# Patient Record
Sex: Female | Born: 1990
Health system: Southern US, Community
[De-identification: ages and names within clinical notes are randomized; demographics above are authoritative.]

## PROBLEM LIST (undated history)

## (undated) ENCOUNTER — Inpatient Hospital Stay (HOSPITAL_COMMUNITY): Payer: Self-pay

## (undated) DIAGNOSIS — I10 Essential (primary) hypertension: Secondary | ICD-10-CM

## (undated) DIAGNOSIS — Z8742 Personal history of other diseases of the female genital tract: Secondary | ICD-10-CM

## (undated) DIAGNOSIS — E119 Type 2 diabetes mellitus without complications: Secondary | ICD-10-CM

## (undated) DIAGNOSIS — N62 Hypertrophy of breast: Secondary | ICD-10-CM

## (undated) HISTORY — PX: WISDOM TOOTH EXTRACTION: SHX21

## (undated) HISTORY — DX: Hypertrophy of breast: N62

---
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
## 2013-06-17 ENCOUNTER — Emergency Department (HOSPITAL_COMMUNITY)
Admission: EM | Admit: 2013-06-17 | Discharge: 2013-06-17 | Disposition: A | Discharge status: Home / Self Care | Payer: 59 | Source: Home / Self Care | Attending: Emergency Medicine | Admitting: Emergency Medicine

## 2013-06-17 ENCOUNTER — Encounter (HOSPITAL_COMMUNITY): Payer: Self-pay | Admitting: Emergency Medicine

## 2013-06-17 DIAGNOSIS — R42 Dizziness and giddiness: Secondary | ICD-10-CM

## 2013-06-17 LAB — POCT I-STAT, CHEM 8
BUN: 15 mg/dL (ref 6–23)
Calcium, Ion: 1.33 mmol/L — ABNORMAL HIGH (ref 1.12–1.23)
Chloride: 104 mEq/L (ref 96–112)
Creatinine, Ser: 0.9 mg/dL (ref 0.50–1.10)
Glucose, Bld: 99 mg/dL (ref 70–99)
TCO2: 25 mmol/L (ref 0–100)

## 2013-06-17 LAB — POCT URINALYSIS DIP (DEVICE)
Leukocytes, UA: NEGATIVE
Nitrite: NEGATIVE
Protein, ur: NEGATIVE mg/dL
Urobilinogen, UA: 0.2 mg/dL (ref 0.0–1.0)

## 2013-06-17 MED ORDER — MECLIZINE HCL 25 MG PO TABS: 25.0000 mg | ORAL_TABLET | Freq: Three times a day (TID) | ORAL | Status: DC | PRN

## 2013-06-17 NOTE — ED Notes (Signed)
C/o feeling dizzy and lightheaded as if she is going to pass out.  Did not pass out, was able to sit down/lie down .  No fall.  Reports a history of diarrhea and vomiting last week.

## 2013-06-17 NOTE — ED Provider Notes (Signed)
CSN: [id number]     Arrival date & time 06/17/13  1753 History   None    Chief Complaint  Patient presents with  . Dizziness   (Consider location/radiation/quality/duration/timing/severity/associated sxs/prior Treatment) Patient is a 22 y.o. female presenting with dizziness. The history is provided by the patient.  Dizziness Quality:  Head spinning, lightheadedness, room spinning and imbalance Severity:  Moderate Onset quality:  Gradual Duration:  10 days Timing:  Intermittent Progression:  Worsening Chronicity:  New Worsened by:  Closing eyes and lying down Ineffective treatments:  None tried Associated symptoms: headaches, nausea, palpitations and vision changes   Associated symptoms: no blood in stool, no chest pain, no diarrhea, no shortness of breath and no syncope    Cheyenne Lozano is a 22 y.o. female who presents to Urgent care with dizziness that started about 10 days ago. The symptoms are worse and today she felt like she was going to pass out. She drank a lot of fluids but has continued to feel dizzy. She denies any medical problems. LPM 06/04/2013, she takes OC's for birth control. History reviewed. No pertinent past medical history. History reviewed. No pertinent past surgical history. No family history on file. History  Substance Use Topics  . Smoking status: Not on file  . Smokeless tobacco: Not on file  . Alcohol Use: No   OB History   Grav Para Term Preterm Abortions TAB SAB Ect Mult Living                 Review of Systems  Constitutional: Negative for fever and chills.  Eyes: Positive for visual disturbance. Negative for pain, redness and itching.  Respiratory: Negative for chest tightness and shortness of breath.   Cardiovascular: Positive for palpitations. Negative for chest pain and syncope.  Gastrointestinal: Positive for nausea. Negative for abdominal pain, diarrhea and blood in stool.  Musculoskeletal: Positive for back pain. Negative for neck  pain and neck stiffness.  Skin: Negative for rash.  Neurological: Positive for dizziness and headaches. Negative for seizures and syncope.  Psychiatric/Behavioral: Negative for confusion. The patient is nervous/anxious.     Allergies  Review of patient's allergies indicates no known allergies.  Home Medications  No current outpatient prescriptions on file. BP 130/83  Pulse 86  Temp(Src) 98.8 F (37.1 C) (Oral)  Resp 18  SpO2 100%  LMP 06/04/2013 Physical Exam  Nursing note and vitals reviewed. Constitutional: She is oriented to person, place, and time. She appears well-developed and well-nourished. No distress.  HENT:  Head: Normocephalic and atraumatic.  Right Ear: Tympanic membrane normal.  Left Ear: Tympanic membrane normal.  Nose: Nose normal.  Mouth/Throat: Uvula is midline, oropharynx is clear and moist and mucous membranes are normal.  Eyes: Conjunctivae and EOM are normal. Pupils are equal, round, and reactive to light.  Neck: Neck supple.  Cardiovascular: Normal rate, regular rhythm and normal heart sounds.   Pulmonary/Chest: Effort normal and breath sounds normal.  Abdominal: Soft. There is no tenderness.  Musculoskeletal: Normal range of motion.  Neurological: She is alert and oriented to person, place, and time. She has normal strength and normal reflexes. No cranial nerve deficit or sensory deficit. She displays a negative Romberg sign. Coordination and gait normal.  Rapid alternating movement without difficulty.  Skin: Skin is warm and dry.  Psychiatric: She has a normal mood and affect. Her behavior is normal. Thought content normal.    ED Course  Procedures  Results for orders placed during the hospital encounter of  06/17/13 (from the past 24 hour(s))  POCT URINALYSIS DIP (DEVICE)     Status: Abnormal   Collection Time    06/17/13  8:57 PM      Result Value Range   Glucose, UA NEGATIVE  NEGATIVE mg/dL   Bilirubin Urine NEGATIVE  NEGATIVE   Ketones, ur  NEGATIVE  NEGATIVE mg/dL   Specific Gravity, Urine >=1.030  1.005 - 1.030   Hgb urine dipstick TRACE (*) NEGATIVE   pH 6.5  5.0 - 8.0   Protein, ur NEGATIVE  NEGATIVE mg/dL   Urobilinogen, UA 0.2  0.0 - 1.0 mg/dL   Nitrite NEGATIVE  NEGATIVE   Leukocytes, UA NEGATIVE  NEGATIVE  POCT PREGNANCY, URINE     Status: None   Collection Time    06/17/13  8:59 PM      Result Value Range   Preg Test, Ur NEGATIVE  NEGATIVE  POCT I-STAT, CHEM 8     Status: Abnormal   Collection Time    06/17/13  9:26 PM      Result Value Range   Sodium 140  135 - 145 mEq/L   Potassium 3.8  3.5 - 5.1 mEq/L   Chloride 104  96 - 112 mEq/L   BUN 15  6 - 23 mg/dL   Creatinine, Ser 1.61  0.50 - 1.10 mg/dL   Glucose, Bld 99  70 - 99 mg/dL   Calcium, Ion 0.96 (*) 1.12 - 1.23 mmol/L   TCO2 25  0 - 100 mmol/L   Hemoglobin 13.9  12.0 - 15.0 g/dL   HCT 04.5  40.9 - 81.1 %     MDM  22 y.o. female with dizziness. Will start Antivert and she will follow up with ENT. Normal neuro exam. Stable for discharge without any immediate complications. I have reviewed this patient's vital signs, nurses notes, appropriate labs and and discussed findings with the patient and her mother. They voice understanding.    Medication List         meclizine 25 MG tablet  Commonly known as:  ANTIVERT  Take 1 tablet (25 mg total) by mouth 3 (three) times daily as needed for dizziness.           [place], [place] 06/17/13 2207

## 2013-06-18 NOTE — ED Provider Notes (Signed)
Medical screening examination/treatment/procedure(s) were performed by non-physician practitioner and as supervising physician I was immediately available for consultation/collaboration.  Randel Hargens, M.D.  Ernisha Sorn C Duaa Stelzner, MD 06/18/13 0035 

## 2015-02-17 ENCOUNTER — Other Ambulatory Visit (HOSPITAL_COMMUNITY): Payer: Self-pay | Admitting: Plastic Surgery

## 2015-02-18 NOTE — Pre-Procedure Instructions (Signed)
    Cheyenne Lozano  02/18/2015      [facility] - [place], [place] - [address] [place] [address] Phone: [phone] Fax: [phone]    Your procedure is scheduled on 02-27-2015  Friday     Report to [facility] Admitting at 5:30A.M.   Call this number if you have problems the morning of surgery:  [phone]   Remember:  Do not eat food or drink liquids after midnight.   Take these medicines the morning of surgery with A SIP OF WATER   Pain medication if needed               NO DIABETES MEDICATIONS THE MORNING OF SURGERY   Do not wear jewelry, make-up or nail polish.  Do not wear lotions, powders, or perfumes.  You may NOT wear deodorant.  Do not shave 48 hours prior to surgery.     Do not bring valuables to the hospital.  [facility] is not responsible for any belongings or valuables.  Contacts, dentures or bridgework may not be worn into surgery.  Leave your suitcase in the car.  After surgery it may be brought to your room.  For patients admitted to the hospital, discharge time will be determined by your treatment team.  Patients discharged the day of surgery will not be allowed to drive home.    Special instructions:  See attached Sheet "Preparing for Surgery" for instructions on CHG shower  Please read over the following fact sheets that you were given. Pain Booklet and Surgical Site Infection Prevention

## 2015-02-19 ENCOUNTER — Encounter (HOSPITAL_COMMUNITY): Payer: Self-pay

## 2015-02-19 ENCOUNTER — Encounter (HOSPITAL_COMMUNITY)
Admission: RE | Admit: 2015-02-19 | Discharge: 2015-02-19 | Disposition: A | Discharge status: Home / Self Care | Payer: 59 | Source: Ambulatory Visit | Attending: Plastic Surgery | Admitting: Plastic Surgery

## 2015-02-19 DIAGNOSIS — N62 Hypertrophy of breast: Secondary | ICD-10-CM | POA: Diagnosis not present

## 2015-02-19 DIAGNOSIS — I498 Other specified cardiac arrhythmias: Secondary | ICD-10-CM | POA: Diagnosis not present

## 2015-02-19 DIAGNOSIS — Z01810 Encounter for preprocedural cardiovascular examination: Secondary | ICD-10-CM | POA: Insufficient documentation

## 2015-02-19 DIAGNOSIS — Z01812 Encounter for preprocedural laboratory examination: Secondary | ICD-10-CM | POA: Diagnosis present

## 2015-02-19 LAB — BASIC METABOLIC PANEL
Anion gap: 4 — ABNORMAL LOW (ref 5–15)
BUN: 10 mg/dL (ref 6–20)
CALCIUM: 9.1 mg/dL (ref 8.9–10.3)
CHLORIDE: 110 mmol/L (ref 101–111)
CO2: 25 mmol/L (ref 22–32)
CREATININE: 0.77 mg/dL (ref 0.44–1.00)
GFR calc non Af Amer: >60 mL/min (ref >60)
GLUCOSE: 83 mg/dL (ref 65–99)
Potassium: 4 mmol/L (ref 3.5–5.1)
Sodium: 139 mmol/L (ref 135–145)

## 2015-02-19 LAB — CBC
HCT: 37.1 % (ref 36.0–46.0)
Hemoglobin: 11.6 g/dL — ABNORMAL LOW (ref 12.0–15.0)
MCH: 26.9 pg (ref 26.0–34.0)
MCHC: 31.3 g/dL (ref 30.0–36.0)
MCV: 85.9 fL (ref 78.0–100.0)
PLATELETS: 297 10*3/uL (ref 150–400)
RBC: 4.32 MIL/uL (ref 3.87–5.11)
RDW: 14.1 % (ref 11.5–15.5)
WBC: 6.1 10*3/uL (ref 4.0–10.5)

## 2015-02-19 LAB — GLUCOSE, CAPILLARY: Glucose-Capillary: 84 mg/dL (ref 65–99)

## 2015-02-19 LAB — HCG, SERUM, QUALITATIVE: PREG SERUM: NEGATIVE

## 2015-02-20 LAB — HEMOGLOBIN A1C
Hgb A1c MFr Bld: 5.8 % — ABNORMAL HIGH (ref 4.8–5.6)
MEAN PLASMA GLUCOSE: 120 mg/dL

## 2015-02-25 NOTE — Anesthesia Preprocedure Evaluation (Addendum)
Anesthesia Evaluation  Patient identified by MRN, date of birth, ID band Patient awake    Reviewed: Allergy & Precautions, NPO status , Patient's Chart, lab work & pertinent test results  Airway Mallampati: II  TM Distance: >3 FB Neck ROM: Full    Dental  (+) Dental Advisory Given, Teeth Intact   Pulmonary neg pulmonary ROS,    breath sounds clear to auscultation       Cardiovascular negative cardio ROS   Rhythm:Regular Rate:Normal     Neuro/Psych negative neurological ROS     GI/Hepatic negative GI ROS, Neg liver ROS,   Endo/Other  diabetes, Type 2, Oral Hypoglycemic Agents  Renal/GU negative Renal ROS     Musculoskeletal   Abdominal   Peds  Hematology  (+) anemia ,   Anesthesia Other Findings   Reproductive/Obstetrics                           Lab Results  Component Value Date   WBC 6.1 02/19/2015   HGB 11.6* 02/19/2015   HCT 37.1 02/19/2015   MCV 85.9 02/19/2015   PLT 297 02/19/2015   Lab Results  Component Value Date   CREATININE 0.77 02/19/2015   BUN 10 02/19/2015   NA 139 02/19/2015   K 4.0 02/19/2015   CL 110 02/19/2015   CO2 25 02/19/2015    Anesthesia Physical Anesthesia Plan  ASA: II  Anesthesia Plan: General   Post-op Pain Management:    Induction: Intravenous  Airway Management Planned: Oral ETT  Additional Equipment:   Intra-op Plan:   Post-operative Plan: Extubation in OR  Informed Consent: I have reviewed the patients History and Physical, chart, labs and discussed the procedure including the risks, benefits and alternatives for the proposed anesthesia with the patient or authorized representative who has indicated his/her understanding and acceptance.   Dental advisory given  Plan Discussed with: CRNA  Anesthesia Plan Comments:       Anesthesia Quick Evaluation

## 2015-02-26 ENCOUNTER — Ambulatory Visit (HOSPITAL_COMMUNITY): Payer: 59

## 2015-02-26 ENCOUNTER — Encounter (HOSPITAL_COMMUNITY): Payer: Self-pay | Admitting: *Deleted

## 2015-02-26 ENCOUNTER — Ambulatory Visit (HOSPITAL_COMMUNITY)
Admission: RE | Admit: 2015-02-26 | Discharge: 2015-02-27 | Disposition: A | Discharge status: Home / Self Care | Payer: 59 | Source: Ambulatory Visit | Attending: Plastic Surgery | Admitting: Plastic Surgery

## 2015-02-26 ENCOUNTER — Encounter (HOSPITAL_COMMUNITY)
Admission: RE | Disposition: A | Discharge status: Home / Self Care | Payer: Self-pay | Source: Ambulatory Visit | Attending: Plastic Surgery

## 2015-02-26 DIAGNOSIS — N62 Hypertrophy of breast: Secondary | ICD-10-CM | POA: Diagnosis not present

## 2015-02-26 DIAGNOSIS — E119 Type 2 diabetes mellitus without complications: Secondary | ICD-10-CM | POA: Insufficient documentation

## 2015-02-26 DIAGNOSIS — M25511 Pain in right shoulder: Secondary | ICD-10-CM | POA: Insufficient documentation

## 2015-02-26 DIAGNOSIS — D649 Anemia, unspecified: Secondary | ICD-10-CM | POA: Diagnosis not present

## 2015-02-26 DIAGNOSIS — M25512 Pain in left shoulder: Secondary | ICD-10-CM | POA: Insufficient documentation

## 2015-02-26 DIAGNOSIS — Z91018 Allergy to other foods: Secondary | ICD-10-CM | POA: Insufficient documentation

## 2015-02-26 DIAGNOSIS — M542 Cervicalgia: Secondary | ICD-10-CM | POA: Diagnosis not present

## 2015-02-26 HISTORY — DX: Hypertrophy of breast: N62

## 2015-02-26 HISTORY — PX: BREAST REDUCTION SURGERY: SHX8

## 2015-02-26 HISTORY — DX: Type 2 diabetes mellitus without complications: E11.9

## 2015-02-26 HISTORY — PX: REDUCTION MAMMAPLASTY: SUR839

## 2015-02-26 LAB — GLUCOSE, CAPILLARY
GLUCOSE-CAPILLARY: 106 mg/dL — AB (ref 65–99)
GLUCOSE-CAPILLARY: 121 mg/dL — AB (ref 65–99)
Glucose-Capillary: 91 mg/dL (ref 65–99)
Glucose-Capillary: 111 mg/dL — ABNORMAL HIGH (ref 65–99)

## 2015-02-26 SURGERY — MAMMOPLASTY, REDUCTION
Anesthesia: General | Site: Breast | Laterality: Bilateral

## 2015-02-26 MED ORDER — DOCUSATE SODIUM 100 MG PO CAPS
100.0000 mg | ORAL_CAPSULE | Freq: Every day | ORAL | Status: DC
  Administered 2015-02-26: 100 mg via ORAL
  Filled 2015-02-26: qty 1

## 2015-02-26 MED ORDER — INSULIN ASPART 100 UNIT/ML ~~LOC~~ SOLN: 0.0000 [IU] | Freq: Three times a day (TID) | SUBCUTANEOUS | Status: DC

## 2015-02-26 MED ORDER — PROPOFOL 10 MG/ML IV BOLUS
INTRAVENOUS | Status: AC
  Filled 2015-02-26: qty 20

## 2015-02-26 MED ORDER — LIDOCAINE HCL (CARDIAC) 20 MG/ML IV SOLN
INTRAVENOUS | Status: DC | PRN
  Administered 2015-02-26: 40 mg via INTRAVENOUS
  Administered 2015-02-26: 60 mg via INTRAVENOUS

## 2015-02-26 MED ORDER — DIPHENHYDRAMINE HCL 50 MG/ML IJ SOLN
INTRAMUSCULAR | Status: AC
  Filled 2015-02-26: qty 1

## 2015-02-26 MED ORDER — KETOROLAC TROMETHAMINE 30 MG/ML IJ SOLN: 30.0000 mg | Freq: Once | INTRAMUSCULAR | Status: DC

## 2015-02-26 MED ORDER — PROMETHAZINE HCL 25 MG/ML IJ SOLN: 6.2500 mg | INTRAMUSCULAR | Status: DC | PRN

## 2015-02-26 MED ORDER — LIDOCAINE HCL (CARDIAC) 20 MG/ML IV SOLN
INTRAVENOUS | Status: AC
  Filled 2015-02-26: qty 5

## 2015-02-26 MED ORDER — DIPHENHYDRAMINE HCL 50 MG/ML IJ SOLN
12.5000 mg | Freq: Four times a day (QID) | INTRAMUSCULAR | Status: DC | PRN
  Administered 2015-02-26: 12.5 mg via INTRAVENOUS

## 2015-02-26 MED ORDER — METFORMIN HCL 500 MG PO TABS
500.0000 mg | ORAL_TABLET | Freq: Two times a day (BID) | ORAL | Status: DC
  Administered 2015-02-26 – 2015-02-27 (×2): 500 mg via ORAL
  Filled 2015-02-26 (×2): qty 1

## 2015-02-26 MED ORDER — OXYCODONE HCL 5 MG/5ML PO SOLN: 5.0000 mg | Freq: Once | ORAL | Status: DC | PRN

## 2015-02-26 MED ORDER — PROPOFOL 10 MG/ML IV BOLUS
INTRAVENOUS | Status: DC | PRN
  Administered 2015-02-26: 50 mg via INTRAVENOUS
  Administered 2015-02-26: 150 mg via INTRAVENOUS

## 2015-02-26 MED ORDER — ONDANSETRON HCL 4 MG/2ML IJ SOLN
INTRAMUSCULAR | Status: AC
  Filled 2015-02-26: qty 2

## 2015-02-26 MED ORDER — EPHEDRINE SULFATE 50 MG/ML IJ SOLN
INTRAMUSCULAR | Status: AC
  Filled 2015-02-26: qty 1

## 2015-02-26 MED ORDER — ROCURONIUM BROMIDE 50 MG/5ML IV SOLN
INTRAVENOUS | Status: AC
  Filled 2015-02-26: qty 1

## 2015-02-26 MED ORDER — OXYCODONE HCL 5 MG PO TABS: 5.0000 mg | ORAL_TABLET | Freq: Once | ORAL | Status: DC | PRN

## 2015-02-26 MED ORDER — MIDAZOLAM HCL 2 MG/2ML IJ SOLN
INTRAMUSCULAR | Status: AC
  Filled 2015-02-26: qty 4

## 2015-02-26 MED ORDER — MIDAZOLAM HCL 5 MG/5ML IJ SOLN
INTRAMUSCULAR | Status: DC | PRN
  Administered 2015-02-26: 2 mg via INTRAVENOUS

## 2015-02-26 MED ORDER — FENTANYL CITRATE (PF) 250 MCG/5ML IJ SOLN
INTRAMUSCULAR | Status: AC
  Filled 2015-02-26: qty 5

## 2015-02-26 MED ORDER — LACTATED RINGERS IV SOLN
INTRAVENOUS | Status: DC
Start: 2015-02-26 — End: 2015-02-27
  Administered 2015-02-26 – 2015-02-27 (×2): via INTRAVENOUS

## 2015-02-26 MED ORDER — ALBUMIN HUMAN 5 % IV SOLN
INTRAVENOUS | Status: DC | PRN
  Administered 2015-02-26: 09:00:00 via INTRAVENOUS

## 2015-02-26 MED ORDER — HYDROMORPHONE HCL 1 MG/ML IJ SOLN
0.2500 mg | INTRAMUSCULAR | Status: DC | PRN
  Administered 2015-02-26: 0.5 mg via INTRAVENOUS

## 2015-02-26 MED ORDER — SUCCINYLCHOLINE CHLORIDE 20 MG/ML IJ SOLN
INTRAMUSCULAR | Status: DC | PRN
  Administered 2015-02-26: 120 mg via INTRAVENOUS

## 2015-02-26 MED ORDER — SUCCINYLCHOLINE CHLORIDE 20 MG/ML IJ SOLN
INTRAMUSCULAR | Status: AC
  Filled 2015-02-26: qty 1

## 2015-02-26 MED ORDER — CEFAZOLIN SODIUM-DEXTROSE 2-3 GM-% IV SOLR
2.0000 g | INTRAVENOUS | Status: AC
  Administered 2015-02-26: 2 g via INTRAVENOUS

## 2015-02-26 MED ORDER — FENTANYL CITRATE (PF) 250 MCG/5ML IJ SOLN
INTRAMUSCULAR | Status: AC
Start: 2015-02-26 — End: 2015-02-26
  Filled 2015-02-26: qty 5

## 2015-02-26 MED ORDER — ONDANSETRON HCL 4 MG/2ML IJ SOLN
INTRAMUSCULAR | Status: DC | PRN
  Administered 2015-02-26: 4 mg via INTRAVENOUS

## 2015-02-26 MED ORDER — HEPARIN SODIUM (PORCINE) 5000 UNIT/ML IJ SOLN
5000.0000 [IU] | Freq: Once | INTRAMUSCULAR | Status: AC
  Administered 2015-02-26: 5000 [IU] via SUBCUTANEOUS

## 2015-02-26 MED ORDER — FENTANYL CITRATE (PF) 100 MCG/2ML IJ SOLN
INTRAMUSCULAR | Status: DC | PRN
  Administered 2015-02-26: 150 ug via INTRAVENOUS
  Administered 2015-02-26 (×5): 50 ug via INTRAVENOUS

## 2015-02-26 MED ORDER — METHOCARBAMOL 500 MG PO TABS
500.0000 mg | ORAL_TABLET | Freq: Four times a day (QID) | ORAL | Status: DC
  Administered 2015-02-26 (×3): 500 mg via ORAL
  Filled 2015-02-26 (×3): qty 1

## 2015-02-26 MED ORDER — CEFAZOLIN SODIUM 1-5 GM-% IV SOLN
1.0000 g | Freq: Four times a day (QID) | INTRAVENOUS | Status: DC
  Administered 2015-02-26 – 2015-02-27 (×4): 1 g via INTRAVENOUS
  Filled 2015-02-26 (×6): qty 50

## 2015-02-26 MED ORDER — INSULIN ASPART 100 UNIT/ML ~~LOC~~ SOLN: 0.0000 [IU] | Freq: Every day | SUBCUTANEOUS | Status: DC

## 2015-02-26 MED ORDER — HEPARIN SODIUM (PORCINE) 5000 UNIT/ML IJ SOLN
INTRAMUSCULAR | Status: AC
  Administered 2015-02-26: 5000 [IU] via SUBCUTANEOUS
  Filled 2015-02-26: qty 1

## 2015-02-26 MED ORDER — GLYCOPYRROLATE 0.2 MG/ML IJ SOLN
INTRAMUSCULAR | Status: AC
  Filled 2015-02-26: qty 1

## 2015-02-26 MED ORDER — LACTATED RINGERS IV SOLN
INTRAVENOUS | Status: DC | PRN
  Administered 2015-02-26 (×2): via INTRAVENOUS

## 2015-02-26 MED ORDER — CEFAZOLIN SODIUM-DEXTROSE 2-3 GM-% IV SOLR
INTRAVENOUS | Status: AC
  Filled 2015-02-26: qty 50

## 2015-02-26 MED ORDER — HYDROMORPHONE HCL 1 MG/ML IJ SOLN
INTRAMUSCULAR | Status: AC
  Administered 2015-02-26: 0.5 mg via INTRAVENOUS
  Filled 2015-02-26: qty 1

## 2015-02-26 MED ORDER — SODIUM CHLORIDE 0.9 % IJ SOLN
INTRAMUSCULAR | Status: AC
  Filled 2015-02-26: qty 10

## 2015-02-26 MED ORDER — HYDROMORPHONE HCL 2 MG PO TABS
2.0000 mg | ORAL_TABLET | ORAL | Status: DC | PRN
  Administered 2015-02-26 – 2015-02-27 (×2): 4 mg via ORAL
  Filled 2015-02-26 (×2): qty 1
  Filled 2015-02-26: qty 2

## 2015-02-26 MED ORDER — HYDROMORPHONE HCL 1 MG/ML IJ SOLN
0.5000 mg | INTRAMUSCULAR | Status: DC | PRN
  Administered 2015-02-26 – 2015-02-27 (×3): 1 mg via INTRAVENOUS
  Filled 2015-02-26 (×3): qty 1

## 2015-02-26 MED ORDER — 0.9 % SODIUM CHLORIDE (POUR BTL) OPTIME
TOPICAL | Status: DC | PRN
  Administered 2015-02-26: 1000 mL
  Administered 2015-02-26: 2000 mL

## 2015-02-26 SURGICAL SUPPLY — 53 items
ADH SKN CLS APL DERMABOND .7 (GAUZE/BANDAGES/DRESSINGS) ×4
ATCH SMKEVC FLXB CAUT HNDSWH (FILTER) ×1 IMPLANT
BINDER BREAST XLRG (GAUZE/BANDAGES/DRESSINGS) ×2 IMPLANT
BLADE 10 SAFETY STRL DISP (BLADE) ×3 IMPLANT
BNDG CMPR MED 10X6 ELC LF (GAUZE/BANDAGES/DRESSINGS) ×1
BNDG ELASTIC 6X10 VLCR STRL LF (GAUZE/BANDAGES/DRESSINGS) ×2 IMPLANT
CANISTER SUCTION 2500CC (MISCELLANEOUS) ×3 IMPLANT
CHLORAPREP W/TINT 26ML (MISCELLANEOUS) ×5 IMPLANT
COVER SURGICAL LIGHT HANDLE (MISCELLANEOUS) ×3 IMPLANT
DERMABOND ADVANCED (GAUZE/BANDAGES/DRESSINGS) ×8
DERMABOND ADVANCED .7 DNX12 (GAUZE/BANDAGES/DRESSINGS) ×1 IMPLANT
DRAPE ORTHO SPLIT 77X108 STRL (DRAPES) ×6
DRAPE PROXIMA HALF (DRAPES) ×6 IMPLANT
DRAPE SURG ORHT 6 SPLT 77X108 (DRAPES) ×2 IMPLANT
DRAPE WARM FLUID 44X44 (DRAPE) ×3 IMPLANT
DRSG PAD ABDOMINAL 8X10 ST (GAUZE/BANDAGES/DRESSINGS) ×10 IMPLANT
ELECT CAUTERY BLADE 6.4 (BLADE) ×3 IMPLANT
ELECT REM PT RETURN 9FT ADLT (ELECTROSURGICAL) ×3
ELECTRODE REM PT RTRN 9FT ADLT (ELECTROSURGICAL) ×1 IMPLANT
EVACUATOR SMOKE ACCUVAC VALLEY (FILTER) ×2
GLOVE BIO SURGEON STRL SZ7.5 (GLOVE) ×3 IMPLANT
GLOVE BIO SURGEON STRL SZ8 (GLOVE) ×4 IMPLANT
GLOVE BIOGEL PI IND STRL 7.0 (GLOVE) IMPLANT
GLOVE BIOGEL PI IND STRL 7.5 (GLOVE) IMPLANT
GLOVE BIOGEL PI IND STRL 8 (GLOVE) ×1 IMPLANT
GLOVE BIOGEL PI IND STRL 8.5 (GLOVE) IMPLANT
GLOVE BIOGEL PI INDICATOR 7.0 (GLOVE) ×2
GLOVE BIOGEL PI INDICATOR 7.5 (GLOVE) ×2
GLOVE BIOGEL PI INDICATOR 8 (GLOVE) ×2
GLOVE BIOGEL PI INDICATOR 8.5 (GLOVE) ×4
GLOVE ECLIPSE 7.5 STRL STRAW (GLOVE) ×2 IMPLANT
GOWN STRL REUS W/ TWL LRG LVL3 (GOWN DISPOSABLE) ×1 IMPLANT
GOWN STRL REUS W/ TWL XL LVL3 (GOWN DISPOSABLE) ×1 IMPLANT
GOWN STRL REUS W/TWL LRG LVL3 (GOWN DISPOSABLE) ×3
GOWN STRL REUS W/TWL XL LVL3 (GOWN DISPOSABLE) ×3
KIT BASIN OR (CUSTOM PROCEDURE TRAY) ×3 IMPLANT
KIT ROOM TURNOVER OR (KITS) ×3 IMPLANT
MARKER SKIN DUAL TIP RULER LAB (MISCELLANEOUS) ×3 IMPLANT
NS IRRIG 1000ML POUR BTL (IV SOLUTION) ×8 IMPLANT
PACK GENERAL/GYN (CUSTOM PROCEDURE TRAY) ×3 IMPLANT
PAD ABD 8X10 STRL (GAUZE/BANDAGES/DRESSINGS) ×2 IMPLANT
PAD ARMBOARD 7.5X6 YLW CONV (MISCELLANEOUS) ×3 IMPLANT
PREFILTER EVAC NS 1 1/3-3/8IN (MISCELLANEOUS) ×3 IMPLANT
SPONGE GAUZE 4X4 12PLY STER LF (GAUZE/BANDAGES/DRESSINGS) ×2 IMPLANT
SPONGE LAP 18X18 X RAY DECT (DISPOSABLE) ×4 IMPLANT
SUT MNCRL AB 3-0 PS2 18 (SUTURE) ×16 IMPLANT
SUT MNCRL AB 4-0 PS2 18 (SUTURE) ×4 IMPLANT
SUT MON AB 2-0 CT1 36 (SUTURE) ×5 IMPLANT
SUT PROLENE 5 0 PS 2 (SUTURE) ×6 IMPLANT
TOWEL OR 17X24 6PK STRL BLUE (TOWEL DISPOSABLE) ×3 IMPLANT
TOWEL OR 17X26 10 PK STRL BLUE (TOWEL DISPOSABLE) ×3 IMPLANT
TUBE CONNECTING 12'X1/4 (SUCTIONS) ×1
TUBE CONNECTING 12X1/4 (SUCTIONS) ×2 IMPLANT

## 2015-02-26 NOTE — Anesthesia Procedure Notes (Addendum)
Procedure Name: Intubation Date/Time: 02/26/2015 7:50 AM Performed by: Fransisca Kaufmann Pre-anesthesia Checklist: Patient identified, Emergency Drugs available, Suction available, Patient being monitored and Timeout performed Patient Re-evaluated:Patient Re-evaluated prior to inductionOxygen Delivery Method: Circle system utilized Preoxygenation: Pre-oxygenation with 100% oxygen Intubation Type: IV induction Ventilation: Mask ventilation without difficulty Laryngoscope Size: Mac and 3 Grade View: Grade I Tube type: Oral Tube size: 7.5 mm Number of attempts: 1 Airway Equipment and Method: Stylet Placement Confirmation: ETT inserted through vocal cords under direct vision,  positive ETCO2 and breath sounds checked- equal and bilateral Secured at: 21 cm Tube secured with: Tape Dental Injury: Teeth and Oropharynx as per pre-operative assessment  Comments: ETT placed By [facility] RN/Care Link/Dl by [facility] CRNA

## 2015-02-26 NOTE — Anesthesia Postprocedure Evaluation (Signed)
  Anesthesia Post-op Note  Patient: Cheyenne Lozano  Procedure(s) Performed: Procedure(s): BILATERAL BREAST REDUCTION   (Bilateral)  Patient Location: PACU  Anesthesia Type:General  Level of Consciousness: awake and alert   Airway and Oxygen Therapy: Patient Spontanous Breathing  Post-op Pain: none  Post-op Assessment: Post-op Vital signs reviewed              Post-op Vital Signs: Reviewed  Last Vitals:  Filed Vitals:   02/26/15 1315  BP: 128/78  Pulse: 101  Temp: 36.9 C  Resp: 18    Complications: No apparent anesthesia complications

## 2015-02-26 NOTE — Brief Op Note (Signed)
02/26/2015  10:44 AM  PATIENT:  Cheyenne Lozano  24 y.o. female  PRE-OPERATIVE DIAGNOSIS:  BILATERAL BREAST HYPERTROPHY  POST-OPERATIVE DIAGNOSIS:  BILATERAL BREAST HYPERTROPHY  PROCEDURE:  Procedure(s): BILATERAL BREAST REDUCTION   (Bilateral)  SURGEON:  Surgeon(s) and Role:    * Etter Sjogren, MD - Primary  PHYSICIAN ASSISTANT:   ASSISTANTS: Myrtie Soman, RNFA   ANESTHESIA: General  EBL:  Total I/O In: 1250 [I.V.:1000; IV Piggyback:250] Out: -   BLOOD ADMINISTERED:none  DRAINS: none   LOCAL MEDICATIONS USED:  NONE  SPECIMEN:  Source of Specimen:  Bilateral breast  DISPOSITION OF SPECIMEN:  PATHOLOGY  COUNTS:  YES  TOURNIQUET:  * No tourniquets in log *  DICTATION: .Other Dictation: Dictation Number [id number]  PLAN OF CARE: Admit for overnight observation  PATIENT DISPOSITION:  PACU - hemodynamically stable.   Delay start of Pharmacological VTE agent (>24hrs) due to surgical blood loss or risk of bleeding: no

## 2015-02-26 NOTE — OR Nursing (Signed)
Breast tissue weights for removal during procedure: Left breast 787g, Right breast 962g.

## 2015-02-26 NOTE — H&P (Signed)
I have re-examined and re-evaluated the patient and there are no changes. See office notes in paper chart for H&P.  Planned Procedure: Bilateral Breast Reduction

## 2015-02-26 NOTE — Transfer of Care (Signed)
Immediate Anesthesia Transfer of Care Note  Patient: Cheyenne Lozano  Procedure(s) Performed: Procedure(s): BILATERAL BREAST REDUCTION   (Bilateral)  Patient Location: PACU  Anesthesia Type:General  Level of Consciousness: awake, alert , oriented and sedated  Airway & Oxygen Therapy: Patient Spontanous Breathing and Patient connected to nasal cannula oxygen  Post-op Assessment: Report given to RN, Post -op Vital signs reviewed and stable and Patient moving all extremities  Post vital signs: Reviewed and stable  Last Vitals:  Filed Vitals:   02/26/15 0549  BP: 128/80  Pulse: 80  Temp: 36.8 C  Resp: 18    Complications: No apparent anesthesia complications

## 2015-02-27 ENCOUNTER — Encounter (HOSPITAL_COMMUNITY): Payer: Self-pay | Admitting: Plastic Surgery

## 2015-02-27 DIAGNOSIS — N62 Hypertrophy of breast: Secondary | ICD-10-CM | POA: Diagnosis not present

## 2015-02-27 LAB — GLUCOSE, CAPILLARY: GLUCOSE-CAPILLARY: 89 mg/dL (ref 65–99)

## 2015-02-27 MED ORDER — METHOCARBAMOL 500 MG PO TABS: 500.0000 mg | ORAL_TABLET | Freq: Four times a day (QID) | ORAL | Status: DC

## 2015-02-27 MED ORDER — HYDROMORPHONE HCL 2 MG PO TABS: 2.0000 mg | ORAL_TABLET | ORAL | Status: DC | PRN

## 2015-02-27 MED ORDER — DOCUSATE SODIUM 100 MG PO CAPS: 100.0000 mg | ORAL_CAPSULE | Freq: Every day | ORAL | Status: DC

## 2015-02-27 NOTE — Op Note (Signed)
Cheyenne Lozano, Cheyenne Lozano              ACCOUNT NO.:  [id number]  MEDICAL RECORD NO.:  [id number]  LOCATION:  [id number]                        FACILITY:  [facility]  PHYSICIAN:  Etter Sjogren, M.D.     DATE OF BIRTH:  11-May-1991  DATE OF PROCEDURE:  02/26/2015 DATE OF DISCHARGE:                              OPERATIVE REPORT   PREOPERATIVE DIAGNOSIS:  Bilateral macromastia with upper back pain, neck pain, shoulder pain.  POSTOPERATIVE DIAGNOSIS:  Bilateral macromastia with upper back pain, neck pain, shoulder pain.  PROCEDURE:  Bilateral breast reduction.  SURGEON:  Etter Sjogren, M.D.  ASSISTANT:  Myrtie Soman, RNFA.  ANESTHESIA:  General.  ESTIMATED BLOOD LOSS:  80 mL.  CLINICAL NOTE:  A 23 year old woman complains of large breast with associated upper back pain, neck pain, shoulder pain, bra strap shoulder grooving, and difficulty sleeping at night.  She desired bilateral breast reduction.  The nature of this procedure, risks plus complications were discussed with her.  These risks include, but not limited to, bleeding, infection, healing problems, scarring, loss of sensation, loss of sensation in the nipples, anesthesia-related complications, asymmetry, failure to relieve symptoms, loss of tissue, loss of skin, loss of fatty tissue, loss of nipples, inability to breast- feed, chronic pain, and overall disappointment.  She understood all these and wished to proceed.  DESCRIPTION OF PROCEDURE:  The patient was marked in the holding area for the bilateral breast reduction.  She did confirm that the right breast was larger than the left and desired a little more tissue to be removed from that site.  She also desired removal of at least half of her breast tissue.  DESCRIPTION OF PROCEDURE:  She was taken to the operating room and placed supine after successful induction of general anesthesia.  She was prepped with ChloraPrep and after waiting full 3 minutes for drying, she was  draped with sterile drapes.  The 45-mm marker marked the nipple- areolar complexes and inferior nipple-areolar pedicles were designed 8 cm wide.  Incisions made, inferior nipple-areolar pedicles were de- epithelialized and then were isolated from surrounding tissues beveling outward both medial and lateral in order to ensure a much broader attachment at the level the chest wall, then was present level of skin. Conclusion of this dissection, the nipple complexes were inspected and found to have bright red bleeding on the periphery consistent with viability.  Resections were performed medial, central, and lateral.  A total of 787 g was resected from the smaller left side and 962 g from the larger right side.  This seemed to give her fairly good symmetry in terms of volume.  Thorough irrigation with saline.  Meticulous hemostasis with electrocautery.  The closures with 2-0 Monocryl and 3-0 Monocryl inverted deep dermal sutures and a 3-0 Monocryl running subcuticular suture.  Measurement was then taken 5.5 cm up from the inframammary crease and the 42-mm marker marked the site for nipple- areolar complex.  This tissue was dissected, irrigation with saline, hemostasis with electrocautery.  Nipple complexes brought through this opening and were again inspected and found to have bright red bleeding on the periphery consistent with viability.  Nipple-areolar insetting with 3-0 Monocryl in inverted deep dermal sutures and  running 4-0 Monocryl subcuticular suture.  Dermabond, dry sterile dressings, circumferential Ace wrap, and the chest binder were then positioned, and she was transferred to the recovery room stable having tolerated the procedure well.  DISPOSITION:  She will be observed overnight.     Etter Sjogren, M.D.     DB/MEDQ  D:  02/26/2015  T:  02/27/2015  Job:  [id number]

## 2015-02-27 NOTE — Discharge Summary (Signed)
Physician Discharge Summary  Patient ID: Cheyenne Lozano MRN: [id number] DOB/AGE: 1990-08-16 23 y.o.  Admit date: 02/26/2015 Discharge date: 02/27/2015  Admission Diagnoses: Macromastia  Discharge Diagnoses: Same Active Problems:   Breast hypertrophy in female   Discharged Condition: good  Hospital Course: On the day of admission the patient was taken to surgery and had bilateral breast reduction. The patient tolerated the procedures well. Postoperatively, the nipple complexes maintained excellent color and capillary refill. The patient was ambulatory and tolerating diet on the first postoperative day. She is ready for discharge.  Treatments: antibiotics: Ancef, anticoagulation: heparin and surgery: bilateral breast reduction  Discharge Exam: Blood pressure 121/64, pulse 108, temperature 98.4 F (36.9 C), temperature source Oral, resp. rate 17, height  (1.499 m), weight 180 lb 3.2 oz (81.738 kg), last menstrual period 02/13/2015, SpO2 97 %.  Operative sites: Nipple complexes have good color and sensation and appear viable. Chest soft bilateral. No evidence of bleeding or infection either side. Good pain control.  Disposition: 01-Home or Self Care     Medication List    STOP taking these medications        traMADol 50 MG tablet  Commonly known as:  ULTRAM      TAKE these medications        docusate sodium 100 MG capsule  Commonly known as:  COLACE  Take 1 capsule (100 mg total) by mouth daily.     HYDROmorphone 2 MG tablet  Commonly known as:  DILAUDID  Take 1-2 tablets (2-4 mg total) by mouth every 4 (four) hours as needed for moderate pain.     metFORMIN 500 MG tablet  Commonly known as:  GLUCOPHAGE  Take 500 mg by mouth 2 (two) times daily with a meal.     methocarbamol 500 MG tablet  Commonly known as:  ROBAXIN  Take 1 tablet (500 mg total) by mouth 4 (four) times daily.         SignedOdis Luster, Macaria Bias M 02/27/2015, 8:45 AM

## 2015-02-27 NOTE — Discharge Instructions (Addendum)
No lifting for 6 weeks No vigorous activity for 6 weeks (including outdoor walks) No driving for 4 weeks OK to walk up stairs slowly Stay propped up Use incentive spirometer at home every hour while awake No shower until Sunday. Then okay to remove all dressings, shower, be gentle with breasts when washing and drying, place pad under breasts in crease to prevent irritation of the incisions (sanitary napkin works well, and place the chest binder on again Take an over-the-counter Probiotic while on antibiotics Take an over-the-counter stool softener (such as Colace) while on pain medication See Dr. Odis Luster next week in office For questions call [phone] or [phone]

## 2016-11-17 ENCOUNTER — Inpatient Hospital Stay (HOSPITAL_COMMUNITY)
Admission: AD | Admit: 2016-11-17 | Discharge: 2016-11-17 | Disposition: A | Discharge status: Home / Self Care | Payer: 59 | Source: Ambulatory Visit | Attending: Obstetrics and Gynecology | Admitting: Obstetrics and Gynecology

## 2016-11-17 ENCOUNTER — Inpatient Hospital Stay (HOSPITAL_COMMUNITY): Payer: 59

## 2016-11-17 ENCOUNTER — Encounter (HOSPITAL_COMMUNITY): Payer: Self-pay | Admitting: *Deleted

## 2016-11-17 DIAGNOSIS — O24912 Unspecified diabetes mellitus in pregnancy, second trimester: Secondary | ICD-10-CM | POA: Diagnosis not present

## 2016-11-17 DIAGNOSIS — Z3A01 Less than 8 weeks gestation of pregnancy: Secondary | ICD-10-CM | POA: Insufficient documentation

## 2016-11-17 DIAGNOSIS — O26891 Other specified pregnancy related conditions, first trimester: Secondary | ICD-10-CM

## 2016-11-17 DIAGNOSIS — B9689 Other specified bacterial agents as the cause of diseases classified elsewhere: Secondary | ICD-10-CM | POA: Insufficient documentation

## 2016-11-17 DIAGNOSIS — N760 Acute vaginitis: Secondary | ICD-10-CM

## 2016-11-17 DIAGNOSIS — R109 Unspecified abdominal pain: Secondary | ICD-10-CM

## 2016-11-17 DIAGNOSIS — O9989 Other specified diseases and conditions complicating pregnancy, childbirth and the puerperium: Secondary | ICD-10-CM | POA: Diagnosis not present

## 2016-11-17 DIAGNOSIS — O26899 Other specified pregnancy related conditions, unspecified trimester: Secondary | ICD-10-CM

## 2016-11-17 DIAGNOSIS — Z3491 Encounter for supervision of normal pregnancy, unspecified, first trimester: Secondary | ICD-10-CM

## 2016-11-17 DIAGNOSIS — O26892 Other specified pregnancy related conditions, second trimester: Secondary | ICD-10-CM | POA: Insufficient documentation

## 2016-11-17 LAB — URINALYSIS, ROUTINE W REFLEX MICROSCOPIC
Bilirubin Urine: NEGATIVE
GLUCOSE, UA: NEGATIVE mg/dL
Hgb urine dipstick: NEGATIVE
KETONES UR: 5 mg/dL — AB
Leukocytes, UA: NEGATIVE
Nitrite: NEGATIVE
PROTEIN: 30 mg/dL — AB
Specific Gravity, Urine: 1.024 (ref 1.005–1.030)
pH: 6 (ref 5.0–8.0)

## 2016-11-17 LAB — WET PREP, GENITAL
Sperm: NONE SEEN
TRICH WET PREP: NONE SEEN
WBC, Wet Prep HPF POC: NONE SEEN
YEAST WET PREP: NONE SEEN

## 2016-11-17 LAB — HCG, QUANTITATIVE, PREGNANCY: HCG, BETA CHAIN, QUANT, S: 201843 m[IU]/mL — AB (ref <5)

## 2016-11-17 LAB — POCT PREGNANCY, URINE: PREG TEST UR: POSITIVE — AB

## 2016-11-17 LAB — OB RESULTS CONSOLE GC/CHLAMYDIA
CHLAMYDIA, DNA PROBE: NEGATIVE
Gonorrhea: NEGATIVE

## 2016-11-17 MED ORDER — METRONIDAZOLE 500 MG PO TABS: 500.0000 mg | ORAL_TABLET | Freq: Two times a day (BID) | ORAL | 0 refills | Status: AC

## 2016-11-17 NOTE — MAU Note (Signed)
Found out preg about 2 1/2 wks ago. Has not started care or been seen yet.  Has had some cramping.

## 2016-11-17 NOTE — Discharge Instructions (Signed)
Abdominal Pain During Pregnancy °Abdominal pain is common in pregnancy. Most of the time, it does not cause harm. There are many causes of abdominal pain. Some causes are more serious than others and sometimes the cause is not known. Abdominal pain can be a sign that something is very wrong with the pregnancy or the pain may have nothing to do with the pregnancy. Always tell your health care provider if you have any abdominal pain. °Follow these instructions at home: °· Do not have sex or put anything in your vagina until your symptoms go away completely. °· Watch your abdominal pain for any changes. °· Get plenty of rest until your pain improves. °· Drink enough fluid to keep your urine clear or pale yellow. °· Take over-the-counter or prescription medicines only as told by your health care provider. °· Keep all follow-up visits as told by your health care provider. This is important. °Contact a health care provider if: °· You have a fever. °· Your pain gets worse or you have cramping. °· Your pain continues after resting. °Get help right away if: °· You are bleeding, leaking fluid, or passing tissue from the vagina. °· You have vomiting or diarrhea that does not go away. °· You have painful or bloody urination. °· You notice a decrease in your baby's movements. °· You feel very weak or faint. °· You have shortness of breath. °· You develop a severe headache with abdominal pain. °· You have abnormal vaginal discharge with abdominal pain. °This information is not intended to replace advice given to you by your health care provider. Make sure you discuss any questions you have with your health care provider. °Document Released: 06/06/2005 Document Revised: 03/17/2016 Document Reviewed: 01/03/2013 °Elsevier Interactive Patient Education © 2018 [employer] ° °First Trimester of Pregnancy °The first trimester of pregnancy is from week 1 until the end of week 13 (months 1 through 3). A week after a sperm fertilizes an  egg, the egg will implant on the wall of the uterus. This embryo will begin to develop into a baby. Genes from you and your partner will form the baby. The female genes will determine whether the baby will be a boy or a girl. At 6-8 weeks, the eyes and face will be formed, and the heartbeat can be seen on ultrasound. At the end of 12 weeks, all the baby's organs will be formed. °Now that you are pregnant, you will want to do everything you can to have a healthy baby. Two of the most important things are to get good prenatal care and to follow your health care provider's instructions. Prenatal care is all the medical care you receive before the baby's birth. This care will help prevent, find, and treat any problems during the pregnancy and childbirth. °Body changes during your first trimester °Your body goes through many changes during pregnancy. The changes vary from woman to woman. °· You may gain or lose a couple of pounds at first. °· You may feel sick to your stomach (nauseous) and you may throw up (vomit). If the vomiting is uncontrollable, call your health care provider. °· You may tire easily. °· You may develop headaches that can be relieved by medicines. All medicines should be approved by your health care provider. °· You may urinate more often. Painful urination may mean you have a bladder infection. °· You may develop heartburn as a result of your pregnancy. °· You may develop constipation because certain hormones are causing the muscles   that push stool through your intestines to slow down. °· You may develop hemorrhoids or swollen veins (varicose veins). °· Your breasts may begin to grow larger and become tender. Your nipples may stick out more, and the tissue that surrounds them (areola) may become darker. °· Your gums may bleed and may be sensitive to brushing and flossing. °· Dark spots or blotches (chloasma, mask of pregnancy) may develop on your face. This will likely fade after the baby is  born. °· Your menstrual periods will stop. °· You may have a loss of appetite. °· You may develop cravings for certain kinds of food. °· You may have changes in your emotions from day to day, such as being excited to be pregnant or being concerned that something may go wrong with the pregnancy and baby. °· You may have more vivid and strange dreams. °· You may have changes in your hair. These can include thickening of your hair, rapid growth, and changes in texture. Some women also have hair loss during or after pregnancy, or hair that feels dry or thin. Your hair will most likely return to normal after your baby is born. ° °What to expect at prenatal visits °During a routine prenatal visit: °· You will be weighed to make sure you and the baby are growing normally. °· Your blood pressure will be taken. °· Your abdomen will be measured to track your baby's growth. °· The fetal heartbeat will be listened to between weeks 10 and 14 of your pregnancy. °· Test results from any previous visits will be discussed. ° °Your health care provider may ask you: °· How you are feeling. °· If you are feeling the baby move. °· If you have had any abnormal symptoms, such as leaking fluid, bleeding, severe headaches, or abdominal cramping. °· If you are using any tobacco products, including cigarettes, chewing tobacco, and electronic cigarettes. °· If you have any questions. ° °Other tests that may be performed during your first trimester include: °· Blood tests to find your blood type and to check for the presence of any previous infections. The tests will also be used to check for low iron levels (anemia) and protein on red blood cells (Rh antibodies). Depending on your risk factors, or if you previously had diabetes during pregnancy, you may have tests to check for high blood sugar that affects pregnant women (gestational diabetes). °· Urine tests to check for infections, diabetes, or protein in the urine. °· An ultrasound to  confirm the proper growth and development of the baby. °· Fetal screens for spinal cord problems (spina bifida) and Down syndrome. °· HIV (human immunodeficiency virus) testing. Routine prenatal testing includes screening for HIV, unless you choose not to have this test. °· You may need other tests to make sure you and the baby are doing well. ° °Follow these instructions at home: °Medicines °· Follow your health care provider's instructions regarding medicine use. Specific medicines may be either safe or unsafe to take during pregnancy. °· Take a prenatal vitamin that contains at least 600 micrograms (mcg) of folic acid. °· If you develop constipation, try taking a stool softener if your health care provider approves. °Eating and drinking °· Eat a balanced diet that includes fresh fruits and vegetables, whole grains, good sources of protein such as meat, eggs, or tofu, and low-fat dairy. Your health care provider will help you determine the amount of weight gain that is right for you. °· Avoid raw meat and uncooked cheese.   These carry germs that can cause birth defects in the baby. °· Eating four or five small meals rather than three large meals a day may help relieve nausea and vomiting. If you start to feel nauseous, eating a few soda crackers can be helpful. Drinking liquids between meals, instead of during meals, also seems to help ease nausea and vomiting. °· Limit foods that are high in fat and processed sugars, such as fried and sweet foods. °· To prevent constipation: °? Eat foods that are high in fiber, such as fresh fruits and vegetables, whole grains, and beans. °? Drink enough fluid to keep your urine clear or pale yellow. °Activity °· Exercise only as directed by your health care provider. Most women can continue their usual exercise routine during pregnancy. Try to exercise for 30 minutes at least 5 days a week. Exercising will help you: °? Control your weight. °? Stay in shape. °? Be prepared for  labor and delivery. °· Experiencing pain or cramping in the lower abdomen or lower back is a good sign that you should stop exercising. Check with your health care provider before continuing with normal exercises. °· Try to avoid standing for long periods of time. Move your legs often if you must stand in one place for a long time. °· Avoid heavy lifting. °· Wear low-heeled shoes and practice good posture. °· You may continue to have sex unless your health care provider tells you not to. °Relieving pain and discomfort °· Wear a good support bra to relieve breast tenderness. °· Take warm sitz baths to soothe any pain or discomfort caused by hemorrhoids. Use hemorrhoid cream if your health care provider approves. °· Rest with your legs elevated if you have leg cramps or low back pain. °· If you develop varicose veins in your legs, wear support hose. Elevate your feet for 15 minutes, 3-4 times a day. Limit salt in your diet. °Prenatal care °· Schedule your prenatal visits by the twelfth week of pregnancy. They are usually scheduled monthly at first, then more often in the last 2 months before delivery. °· Write down your questions. Take them to your prenatal visits. °· Keep all your prenatal visits as told by your health care provider. This is important. °Safety °· Wear your seat belt at all times when driving. °· Make a list of emergency phone numbers, including numbers for family, friends, the hospital, and police and fire departments. °General instructions °· Ask your health care provider for a referral to a local prenatal education class. Begin classes no later than the beginning of month 6 of your pregnancy. °· Ask for help if you have counseling or nutritional needs during pregnancy. Your health care provider can offer advice or refer you to specialists for help with various needs. °· Do not use hot tubs, steam rooms, or saunas. °· Do not douche or use tampons or scented sanitary pads. °· Do not cross your legs  for long periods of time. °· Avoid cat litter boxes and soil used by cats. These carry germs that can cause birth defects in the baby and possibly loss of the fetus by miscarriage or stillbirth. °· Avoid all smoking, herbs, alcohol, and medicines not prescribed by your health care provider. Chemicals in these products affect the formation and growth of the baby. °· Do not use any products that contain nicotine or tobacco, such as cigarettes and e-cigarettes. If you need help quitting, ask your health care provider. You may receive counseling support and   other resources to help you quit. °· Schedule a dentist appointment. At home, brush your teeth with a soft toothbrush and be gentle when you floss. °Contact a health care provider if: °· You have dizziness. °· You have mild pelvic cramps, pelvic pressure, or nagging pain in the abdominal area. °· You have persistent nausea, vomiting, or diarrhea. °· You have a bad smelling vaginal discharge. °· You have pain when you urinate. °· You notice increased swelling in your face, hands, legs, or ankles. °· You are exposed to fifth disease or chickenpox. °· You are exposed to German measles (rubella) and have never had it. °Get help right away if: °· You have a fever. °· You are leaking fluid from your vagina. °· You have spotting or bleeding from your vagina. °· You have severe abdominal cramping or pain. °· You have rapid weight gain or loss. °· You vomit blood or material that looks like coffee grounds. °· You develop a severe headache. °· You have shortness of breath. °· You have any kind of trauma, such as from a fall or a car accident. °Summary °· The first trimester of pregnancy is from week 1 until the end of week 13 (months 1 through 3). °· Your body goes through many changes during pregnancy. The changes vary from woman to woman. °· You will have routine prenatal visits. During those visits, your health care provider will examine you, discuss any test results you  may have, and talk with you about how you are feeling. °This information is not intended to replace advice given to you by your health care provider. Make sure you discuss any questions you have with your health care provider. °Document Released: 05/31/2001 Document Revised: 05/18/2016 Document Reviewed: 05/18/2016 °Elsevier Interactive Patient Education © 2017 Elsevier Inc. ° °

## 2016-11-17 NOTE — MAU Provider Note (Signed)
History     CSN: [id number]  Arrival date and time: 11/17/16 1621   First Provider Initiated Contact with Patient 11/17/16 1823      Chief Complaint  Patient presents with  . Possible Pregnancy  . Abdominal Cramping   Cheyenne A Mayford Knifeurner is a 26 y.o. G2P0010 at 4032w0d presenting with mild intermittent menstrual-like cramps for about 2 weeks.     Abdominal Cramping  This is a recurrent problem. The current episode started 1 to 4 weeks ago. The onset quality is gradual. The problem occurs intermittently. The problem has been waxing and waning. The pain is located in the suprapubic region. The pain is mild. The quality of the pain is cramping. Pertinent negatives include no constipation, diarrhea, dysuria, frequency, hematuria, nausea or vomiting. Nothing aggravates the pain. The pain is relieved by nothing. She has tried nothing for the symptoms.     Past Medical History:  Diagnosis Date  . Type II diabetes mellitus (HCC)    "actually I'm prediabetic; dr. put me on RX to get A1C #'s down" (02/26/2015)    Past Surgical History:  Procedure Laterality Date  . BREAST REDUCTION SURGERY Bilateral 02/26/2015   Procedure: BILATERAL BREAST REDUCTION  ;  Surgeon: Etter Sjogrenavid Bowers, MD;  Location[facility] OR;  Service: Plastics;  Laterality: Bilateral;  . REDUCTION MAMMAPLASTY Bilateral 02/26/2015  . WISDOM TOOTH EXTRACTION  ~ 2010   "bottom 2"    History reviewed. No pertinent family history.  Social History  Substance Use Topics  . Smoking status: Never Smoker  . Smokeless tobacco: Never Used  . Alcohol use Yes     Comment: 02/26/2015 "2-3 mixed drinks a couple weekends/month"    Allergies:  Allergies  Allergen Reactions  . Coconut Oil Hives and Itching    Prescriptions Prior to Admission  Medication Sig Dispense Refill Last Dose  . Prenatal Vit-Fe Fumarate-FA (PRENATAL MULTIVITAMIN) TABS tablet Take 1 tablet by mouth daily at 12 noon.   11/17/2016 at Unknown time    Review of Systems   Gastrointestinal: Negative for constipation, diarrhea, nausea and vomiting.  Genitourinary: Negative for dysuria, frequency and hematuria.   Physical Exam   Blood pressure 114/68, pulse 65, temperature 98.7 F (37.1 C), temperature source Oral, resp. rate 18, height 5\' 1"  (1.549 m), weight 64.2 kg (141 lb 8 oz), last menstrual period 09/29/2016, SpO2 100 %.  Physical Exam  Nursing note and vitals reviewed. Constitutional: She is oriented to person, place, and time. She appears well-developed and well-nourished. No distress.  HENT:  Head: Normocephalic.  Eyes: No scleral icterus.  Neck: Neck supple. No thyromegaly present.  Cardiovascular: Normal rate.   Respiratory: Effort normal.  GI: Soft. There is no tenderness.  Genitourinary: Vagina normal and uterus normal. No vaginal discharge found.  Genitourinary Comments: Scant white discharge  Musculoskeletal: Normal range of motion.  Neurological: She is alert and oriented to person, place, and time.  Skin: Skin is warm and dry.  Psychiatric: She has a normal mood and affect. Her behavior is normal. Thought content normal.    MAU Course  Procedures Results for orders placed or performed during the hospital encounter of 11/17/16 (from the past 24 hour(s))  Urinalysis, Routine w reflex microscopic     Status: Abnormal   Collection Time: 11/17/16  5:25 PM  Result Value Ref Range   Color, Urine YELLOW YELLOW   APPearance CLEAR CLEAR   Specific Gravity, Urine 1.024 1.005 - 1.030   pH 6.0 5.0 - 8.0  Glucose, UA NEGATIVE NEGATIVE mg/dL   Hgb urine dipstick NEGATIVE NEGATIVE   Bilirubin Urine NEGATIVE NEGATIVE   Ketones, ur 5 (A) NEGATIVE mg/dL   Protein, ur 30 (A) NEGATIVE mg/dL   Nitrite NEGATIVE NEGATIVE   Leukocytes, UA NEGATIVE NEGATIVE   RBC / HPF 0-5 0 - 5 RBC/hpf   WBC, UA 0-5 0 - 5 WBC/hpf   Bacteria, UA RARE (A) NONE SEEN   Squamous Epithelial / LPF 0-5 (A) NONE SEEN   Mucous PRESENT   Pregnancy, urine POC     Status:  Abnormal   Collection Time: 11/17/16  5:35 PM  Result Value Ref Range   Preg Test, Ur POSITIVE (A) NEGATIVE  Wet prep, genital     Status: Abnormal   Collection Time: 11/17/16  6:30 PM  Result Value Ref Range   Yeast Wet Prep HPF POC NONE SEEN NONE SEEN   Trich, Wet Prep NONE SEEN NONE SEEN   Clue Cells Wet Prep HPF POC PRESENT (A) NONE SEEN   WBC, Wet Prep HPF POC NONE SEEN NONE SEEN   Sperm NONE SEEN   hCG, quantitative, pregnancy     Status: Abnormal   Collection Time: 11/17/16  6:34 PM  Result Value Ref Range   hCG, Beta Chain, Quant, S 201,843 (H) <5 mIU/mL  US Ob Comp Less 14 Wks  Result Date: 11/17/2016 CLINICAL DATA:  Abdominal cramping EXAM: OBSTETRIC <14 WK ULTRASOUND TECHNIQUE: Transabdominal ultrasound was performed for evaluation of the gestation as well as the maternal uterus and adnexal regions. COMPARISON:  None. FINDINGS: Intrauterine gestational sac: Single intrauterine gestation Yolk sac:  Visualized Embryo:  Visualized Cardiac Activity: Visualized Heart Rate: 128 bpm CRL:   8.7  mm   6 w 5 d                  [place] EDC: 07/08/2017 Subchorionic hemorrhage:  None visualized. Maternal uterus/adnexae: Bilateral ovaries are within normal limits. The left ovary measures 4.4 x 2.9 x 2.7 cm. The right ovary measures 3.8 x 2.9 x 2.8 cm. No significant free fluid. IMPRESSION: Single viable intrauterine gestation as above. There are no other specific abnormalities visualized. Electronically Signed   By: Jasmine Pang M.D.   On: 11/17/2016 19:33      Assessment and Plan  G2P0010 at [redacted]w[redacted]d 1. Viable pregnancy in first trimester   2. Abdominal pain during pregnancy in first trimester   3. Abdominal pain affecting pregnancy   4. BV (bacterial vaginosis)    Allergies as of 11/17/2016      Reactions   Coconut Oil Hives, Itching      Medication List    TAKE these medications   metroNIDAZOLE 500 MG tablet Commonly known as:  FLAGYL Take 1 tablet (500 mg total) by mouth 2 (two) times  daily.   prenatal multivitamin Tabs tablet Take 1 tablet by mouth daily at 12 noon.     Discussed asymptomatic BV by [facility] and patient elects to wait until 2nd trimester to treat. List of pregnancy providers given and she will choose and call for appointment (private insurance).     Krystian Younglove CNM 11/17/2016, 7:42 PM

## 2016-11-18 LAB — GC/CHLAMYDIA PROBE AMP (~~LOC~~) NOT AT ARMC
Chlamydia: NEGATIVE
Neisseria Gonorrhea: NEGATIVE

## 2016-11-18 LAB — HIV ANTIBODY (ROUTINE TESTING W REFLEX): HIV Screen 4th Generation wRfx: NONREACTIVE

## 2016-12-11 ENCOUNTER — Encounter (HOSPITAL_COMMUNITY): Payer: Self-pay

## 2016-12-11 ENCOUNTER — Inpatient Hospital Stay (HOSPITAL_COMMUNITY)
Admission: AD | Admit: 2016-12-11 | Discharge: 2016-12-11 | Disposition: A | Discharge status: Home / Self Care | Payer: 59 | Source: Ambulatory Visit | Attending: Obstetrics & Gynecology | Admitting: Obstetrics & Gynecology

## 2016-12-11 DIAGNOSIS — O26891 Other specified pregnancy related conditions, first trimester: Secondary | ICD-10-CM | POA: Diagnosis not present

## 2016-12-11 DIAGNOSIS — Z3A10 10 weeks gestation of pregnancy: Secondary | ICD-10-CM | POA: Diagnosis not present

## 2016-12-11 DIAGNOSIS — O24911 Unspecified diabetes mellitus in pregnancy, first trimester: Secondary | ICD-10-CM | POA: Diagnosis not present

## 2016-12-11 DIAGNOSIS — R350 Frequency of micturition: Secondary | ICD-10-CM

## 2016-12-11 DIAGNOSIS — R109 Unspecified abdominal pain: Secondary | ICD-10-CM

## 2016-12-11 DIAGNOSIS — R1084 Generalized abdominal pain: Secondary | ICD-10-CM | POA: Diagnosis not present

## 2016-12-11 DIAGNOSIS — K5909 Other constipation: Secondary | ICD-10-CM

## 2016-12-11 LAB — URINALYSIS, ROUTINE W REFLEX MICROSCOPIC
Bilirubin Urine: NEGATIVE
Glucose, UA: NEGATIVE mg/dL
KETONES UR: NEGATIVE mg/dL
LEUKOCYTES UA: NEGATIVE
Nitrite: NEGATIVE
Protein, ur: NEGATIVE mg/dL
RBC / HPF: NONE SEEN RBC/hpf (ref 0–5)
Specific Gravity, Urine: 1.006 (ref 1.005–1.030)
pH: 7 (ref 5.0–8.0)

## 2016-12-11 MED ORDER — POLYETHYLENE GLYCOL 3350 17 GM/SCOOP PO POWD: 17.0000 g | Freq: Every day | ORAL | 2 refills | Status: DC | PRN

## 2016-12-11 NOTE — Discharge Instructions (Signed)
Abdominal Pain During Pregnancy °Abdominal pain is common in pregnancy. Most of the time, it does not cause harm. There are many causes of abdominal pain. Some causes are more serious than others and sometimes the cause is not known. Abdominal pain can be a sign that something is very wrong with the pregnancy or the pain may have nothing to do with the pregnancy. Always tell your health care provider if you have any abdominal pain. °Follow these instructions at home: °· Do not have sex or put anything in your vagina until your symptoms go away completely. °· Watch your abdominal pain for any changes. °· Get plenty of rest until your pain improves. °· Drink enough fluid to keep your urine clear or pale yellow. °· Take over-the-counter or prescription medicines only as told by your health care provider. °· Keep all follow-up visits as told by your health care provider. This is important. °Contact a health care provider if: °· You have a fever. °· Your pain gets worse or you have cramping. °· Your pain continues after resting. °Get help right away if: °· You are bleeding, leaking fluid, or passing tissue from the vagina. °· You have vomiting or diarrhea that does not go away. °· You have painful or bloody urination. °· You notice a decrease in your baby's movements. °· You feel very weak or faint. °· You have shortness of breath. °· You develop a severe headache with abdominal pain. °· You have abnormal vaginal discharge with abdominal pain. °This information is not intended to replace advice given to you by your health care provider. Make sure you discuss any questions you have with your health care provider. °Document Released: 06/06/2005 Document Revised: 03/17/2016 Document Reviewed: 01/03/2013 °Elsevier Interactive Patient Education © 2018 Elsevier Inc. ° ° °Round Ligament Pain During Pregnancy  ° °Round ligament pain is a sharp pain or jabbing feeling often felt in the lower belly or groin area on one or both  sides. It is one of the most common complaints during pregnancy and is considered a normal part of pregnancy. It is most often felt during the second trimester.  ° °Here is what you need to know about round ligament pain, including some tips to help you feel better.  ° °Causes of Round Ligament Pain  ° °Several thick ligaments surround and support your womb (uterus) as it grows during pregnancy. One of them is called the round ligament.  ° °The round ligament connects the front part of the womb to your groin, the area where your legs attach to your pelvis. The round ligament normally tightens and relaxes slowly.  ° °As your baby and womb grow, the round ligament stretches. That makes it more likely to become strained.  ° °Sudden movements can cause the ligament to tighten quickly, like a rubber band snapping. This causes a sudden and quick jabbing feeling.  ° °Symptoms of Round Ligament Pain  ° °Round ligament pain can be concerning and uncomfortable. But it is considered normal as your body changes during pregnancy.  ° °The symptoms of round ligament pain include a sharp, sudden spasm in the belly. It usually affects the right side, but it may happen on both sides. The pain only lasts a few seconds.  ° °Exercise may cause the pain, as will rapid movements such as:  °sneezing  °coughing  °laughing  °rolling over in bed  °standing up too quickly  ° °Treatment of Round Ligament Pain  ° °Here are some tips that   help reduce your discomfort:   Pain relief. Take over-the-counter acetaminophen for pain, if necessary. Ask your doctor if this is OK.   Exercise. Get plenty of exercise to keep your stomach (core) muscles strong. Doing stretching exercises or prenatal yoga can be helpful. Ask your doctor which exercises are safe for you and your baby.   A helpful exercise involves putting your hands and knees on the floor, lowering your head, and pushing your backside into the air.   Avoid sudden movements. Change  positions slowly (such as standing up or sitting down) to avoid sudden movements that may cause stretching and pain.   Flex your hips. Bend and flex your hips before you cough, sneeze, or laugh to avoid pulling on the ligaments.   Apply warmth. A heating pad or warm bath may be helpful. Ask your doctor if this is OK. Extreme heat can be dangerous to the baby.   You should try to modify your daily activity level and avoid positions that may worsen the condition.   When to Call the Doctor/Midwife   Always tell your doctor or midwife about any type of pain you have during pregnancy. Round ligament pain is quick and doesn't last long.   Call your health care provider immediately if you have:  severe pain  fever  chills  pain on urination  difficulty walking   Belly pain during pregnancy can be due to many different causes. It is important for your doctor to rule out more serious conditions, including pregnancy complications such as placenta abruption or non-pregnancy illnesses such as:  inguinal hernia  appendicitis  stomach, liver, and kidney problems  Preterm labor pains may sometimes be mistaken for round ligament pain.     First Trimester of Pregnancy The first trimester of pregnancy is from week 1 until the end of week 13 (months 1 through 3). A week after a sperm fertilizes an egg, the egg will implant on the wall of the uterus. This embryo will begin to develop into a baby. Genes from you and your partner will form the baby. The female genes will determine whether the baby will be a boy or a girl. At 6-8 weeks, the eyes and face will be formed, and the heartbeat can be seen on ultrasound. At the end of 12 weeks, all the baby's organs will be formed. Now that you are pregnant, you will want to do everything you can to have a healthy baby. Two of the most important things are to get good prenatal care and to follow your health care provider's instructions. Prenatal care is all the medical  care you receive before the baby's birth. This care will help prevent, find, and treat any problems during the pregnancy and childbirth. Body changes during your first trimester Your body goes through many changes during pregnancy. The changes vary from woman to woman.  You may gain or lose a couple of pounds at first.  You may feel sick to your stomach (nauseous) and you may throw up (vomit). If the vomiting is uncontrollable, call your health care provider.  You may tire easily.  You may develop headaches that can be relieved by medicines. All medicines should be approved by your health care provider.  You may urinate more often. Painful urination may mean you have a bladder infection.  You may develop heartburn as a result of your pregnancy.  You may develop constipation because certain hormones are causing the muscles that push stool through your intestines to  slow down.  You may develop hemorrhoids or swollen veins (varicose veins).  Your breasts may begin to grow larger and become tender. Your nipples may stick out more, and the tissue that surrounds them (areola) may become darker.  Your gums may bleed and may be sensitive to brushing and flossing.  Dark spots or blotches (chloasma, mask of pregnancy) may develop on your face. This will likely fade after the baby is born.  Your menstrual periods will stop.  You may have a loss of appetite.  You may develop cravings for certain kinds of food.  You may have changes in your emotions from day to day, such as being excited to be pregnant or being concerned that something may go wrong with the pregnancy and baby.  You may have more vivid and strange dreams.  You may have changes in your hair. These can include thickening of your hair, rapid growth, and changes in texture. Some women also have hair loss during or after pregnancy, or hair that feels dry or thin. Your hair will most likely return to normal after your baby is  born.  What to expect at prenatal visits During a routine prenatal visit:  You will be weighed to make sure you and the baby are growing normally.  Your blood pressure will be taken.  Your abdomen will be measured to track your baby's growth.  The fetal heartbeat will be listened to between weeks 10 and 14 of your pregnancy.  Test results from any previous visits will be discussed.  Your health care provider may ask you:  How you are feeling.  If you are feeling the baby move.  If you have had any abnormal symptoms, such as leaking fluid, bleeding, severe headaches, or abdominal cramping.  If you are using any tobacco products, including cigarettes, chewing tobacco, and electronic cigarettes.  If you have any questions.  Other tests that may be performed during your first trimester include:  Blood tests to find your blood type and to check for the presence of any previous infections. The tests will also be used to check for low iron levels (anemia) and protein on red blood cells (Rh antibodies). Depending on your risk factors, or if you previously had diabetes during pregnancy, you may have tests to check for high blood sugar that affects pregnant women (gestational diabetes).  Urine tests to check for infections, diabetes, or protein in the urine.  An ultrasound to confirm the proper growth and development of the baby.  Fetal screens for spinal cord problems (spina bifida) and Down syndrome.  HIV (human immunodeficiency virus) testing. Routine prenatal testing includes screening for HIV, unless you choose not to have this test.  You may need other tests to make sure you and the baby are doing well.  Follow these instructions at home: Medicines  Follow your health care provider's instructions regarding medicine use. Specific medicines may be either safe or unsafe to take during pregnancy.  Take a prenatal vitamin that contains at least 600 micrograms (mcg) of folic  acid.  If you develop constipation, try taking a stool softener if your health care provider approves. Eating and drinking  Eat a balanced diet that includes fresh fruits and vegetables, whole grains, good sources of protein such as meat, eggs, or tofu, and low-fat dairy. Your health care provider will help you determine the amount of weight gain that is right for you.  Avoid raw meat and uncooked cheese. These carry germs that can cause birth  defects in the baby.  Eating four or five small meals rather than three large meals a day may help relieve nausea and vomiting. If you start to feel nauseous, eating a few soda crackers can be helpful. Drinking liquids between meals, instead of during meals, also seems to help ease nausea and vomiting.  Limit foods that are high in fat and processed sugars, such as fried and sweet foods.  To prevent constipation: ? Eat foods that are high in fiber, such as fresh fruits and vegetables, whole grains, and beans. ? Drink enough fluid to keep your urine clear or pale yellow. Activity  Exercise only as directed by your health care provider. Most women can continue their usual exercise routine during pregnancy. Try to exercise for 30 minutes at least 5 days a week. Exercising will help you: ? Control your weight. ? Stay in shape. ? Be prepared for labor and delivery.  Experiencing pain or cramping in the lower abdomen or lower back is a good sign that you should stop exercising. Check with your health care provider before continuing with normal exercises.  Try to avoid standing for long periods of time. Move your legs often if you must stand in one place for a long time.  Avoid heavy lifting.  Wear low-heeled shoes and practice good posture.  You may continue to have sex unless your health care provider tells you not to. Relieving pain and discomfort  Wear a good support bra to relieve breast tenderness.  Take warm sitz baths to soothe any pain or  discomfort caused by hemorrhoids. Use hemorrhoid cream if your health care provider approves.  Rest with your legs elevated if you have leg cramps or low back pain.  If you develop varicose veins in your legs, wear support hose. Elevate your feet for 15 minutes, 3-4 times a day. Limit salt in your diet. Prenatal care  Schedule your prenatal visits by the twelfth week of pregnancy. They are usually scheduled monthly at first, then more often in the last 2 months before delivery.  Write down your questions. Take them to your prenatal visits.  Keep all your prenatal visits as told by your health care provider. This is important. Safety  Wear your seat belt at all times when driving.  Make a list of emergency phone numbers, including numbers for family, friends, the hospital, and police and fire departments. General instructions  Ask your health care provider for a referral to a local prenatal education class. Begin classes no later than the beginning of month 6 of your pregnancy.  Ask for help if you have counseling or nutritional needs during pregnancy. Your health care provider can offer advice or refer you to specialists for help with various needs.  Do not use hot tubs, steam rooms, or saunas.  Do not douche or use tampons or scented sanitary pads.  Do not cross your legs for long periods of time.  Avoid cat litter boxes and soil used by cats. These carry germs that can cause birth defects in the baby and possibly loss of the fetus by miscarriage or stillbirth.  Avoid all smoking, herbs, alcohol, and medicines not prescribed by your health care provider. Chemicals in these products affect the formation and growth of the baby.  Do not use any products that contain nicotine or tobacco, such as cigarettes and e-cigarettes. If you need help quitting, ask your health care provider. You may receive counseling support and other resources to help you quit.  Schedule  a dentist appointment.  At home, brush your teeth with a soft toothbrush and be gentle when you floss. Contact a health care provider if:  You have dizziness.  You have mild pelvic cramps, pelvic pressure, or nagging pain in the abdominal area.  You have persistent nausea, vomiting, or diarrhea.  You have a bad smelling vaginal discharge.  You have pain when you urinate.  You notice increased swelling in your face, hands, legs, or ankles.  You are exposed to fifth disease or chickenpox.  You are exposed to [place] measles (rubella) and have never had it. Get help right away if:  You have a fever.  You are leaking fluid from your vagina.  You have spotting or bleeding from your vagina.  You have severe abdominal cramping or pain.  You have rapid weight gain or loss.  You vomit blood or material that looks like coffee grounds.  You develop a severe headache.  You have shortness of breath.  You have any kind of trauma, such as from a fall or a car accident. Summary  The first trimester of pregnancy is from week 1 until the end of week 13 (months 1 through 3).  Your body goes through many changes during pregnancy. The changes vary from woman to woman.  You will have routine prenatal visits. During those visits, your health care provider will examine you, discuss any test results you may have, and talk with you about how you are feeling. This information is not intended to replace advice given to you by your health care provider. Make sure you discuss any questions you have with your health care provider. Document Released: 05/31/2001 Document Revised: 05/18/2016 Document Reviewed: 05/18/2016 Elsevier Interactive Patient Education  2017 [employer].

## 2016-12-11 NOTE — MAU Provider Note (Signed)
Chief Complaint: Abdominal Cramping   First Provider Initiated Contact with Patient 12/11/16 1916     SUBJECTIVE HPI: Cheyenne Lozano is a 26 y.o. G2P0010 at [redacted]w[redacted]d who presents to Maternity Admissions reporting generalized abd pain.  Location: Generalized abd Quality: sharp, cramping Severity: moderate Duration: 4 hours Context: early pregnancy Timing: constant w/ intermittent exacerbations  Modifying factors: None. Hasn't tried anything Associated signs and symptoms: Pos for chronic constipation. Neg for fever, chills, GI, complaints, urinary complaints, VB, vaginal discharge.   Neg GC/Chlamydia cultures and live IUP verified 11/17/16. Declines repeat testing.   Past Medical History:  Diagnosis Date  . Type II diabetes mellitus (HCC)    "actually I'm prediabetic; dr. put me on RX to get A1C #'s down" (02/26/2015)   OB History  Gravida Para Term Preterm AB Living  2       1    SAB TAB Ectopic Multiple Live Births  1            # Outcome Date GA Lbr Len/2nd Weight Sex Delivery Anes PTL Lv  2 Current           1 SAB              Past Surgical History:  Procedure Laterality Date  . BREAST REDUCTION SURGERY Bilateral 02/26/2015   Procedure: BILATERAL BREAST REDUCTION  ;  Surgeon: Etter Sjogren, MD;  Location: [facility] OR;  Service: Plastics;  Laterality: Bilateral;  . REDUCTION MAMMAPLASTY Bilateral 02/26/2015  . WISDOM TOOTH EXTRACTION  ~ 2010   "bottom 2"   Social History   Social History  . Marital status: Single    Spouse name: N/A  . Number of children: N/A  . Years of education: N/A   Occupational History  . Not on file.   Social History Main Topics  . Smoking status: Never Smoker  . Smokeless tobacco: Never Used  . Alcohol use Yes     Comment: 02/26/2015 "2-3 mixed drinks a couple weekends/month"  . Drug use: No  . Sexual activity: No   Other Topics Concern  . Not on file   Social History Narrative  . No narrative on file   Family History  Problem Relation Age  of Onset  . Diabetes Father   . Hypertension Father   . Diabetes Maternal Grandmother   . Hypertension Maternal Grandmother   . Diabetes Maternal Grandfather   . Hypertension Maternal Grandfather    No current facility-administered medications on file prior to encounter.    Current Outpatient Prescriptions on File Prior to Encounter  Medication Sig Dispense Refill  . Prenatal Vit-Fe Fumarate-FA (PRENATAL MULTIVITAMIN) TABS tablet Take 1 tablet by mouth daily at 12 noon.     Allergies  Allergen Reactions  . Coconut Oil Hives and Itching    I have reviewed patient's Past Medical Hx, Surgical Hx, Family Hx, Social Hx, medications and allergies.   Review of Systems  Constitutional: Negative for chills and fever.  Gastrointestinal: Positive for abdominal pain. Negative for abdominal distention, constipation, diarrhea, nausea and vomiting.  Genitourinary: Negative for dysuria, flank pain, hematuria, urgency, vaginal bleeding and vaginal discharge.  Musculoskeletal: Negative for back pain.    OBJECTIVE Patient Vitals for the past 24 hrs:  BP Temp Temp src Pulse Resp SpO2 Height Weight  12/11/16 1929 133/89 - - 82 18 - - -  12/11/16 1858 131/87 99 F (37.2 C) Oral 84 17 98 % 4\' 11"  (1.499 m) 140 lb (63.5 kg)   Constitutional:  Well-developed, well-nourished female in no acute distress.  Cardiovascular: normal rate Respiratory: normal rate and effort.  GI: Abd mildly distended, non-tender, fundus non-palpable. Pos BS x 4 MS: Extremities nontender, no edema, normal ROM Neurologic: Alert and oriented x 4.  GU: Neg CVAT.  PELVIC EXAM: NEFG, physiologic discharge, no blood noted, cervix closed; uterus 11 week size, no adnexal tenderness or masses. No CMT.  LAB RESULTS Results for orders placed or performed during the hospital encounter of 12/11/16 (from the past 24 hour(s))  Urinalysis, Routine w reflex microscopic     Status: Abnormal   Collection Time: 12/11/16  6:42 PM  Result  Value Ref Range   Color, Urine STRAW (A) YELLOW   APPearance CLEAR CLEAR   Specific Gravity, Urine 1.006 1.005 - 1.030   pH 7.0 5.0 - 8.0   Glucose, UA NEGATIVE NEGATIVE mg/dL   Hgb urine dipstick SMALL (A) NEGATIVE   Bilirubin Urine NEGATIVE NEGATIVE   Ketones, ur NEGATIVE NEGATIVE mg/dL   Protein, ur NEGATIVE NEGATIVE mg/dL   Nitrite NEGATIVE NEGATIVE   Leukocytes, UA NEGATIVE NEGATIVE   RBC / HPF NONE SEEN 0 - 5 RBC/hpf   WBC, UA 0-5 0 - 5 WBC/hpf   Bacteria, UA RARE (A) NONE SEEN   Squamous Epithelial / LPF 0-5 (A) NONE SEEN    IMAGING NA  MAU COURSE Orders Placed This Encounter  Procedures  . Culture, OB Urine  . Urinalysis, Routine w reflex microscopic  . Discharge patient   Meds ordered this encounter  Medications  . polyethylene glycol powder (GLYCOLAX/MIRALAX) powder    Sig: Take 17 g by mouth daily as needed for moderate constipation.    Dispense:  500 g    Refill:  2    Order Specific Question:   Supervising Provider    Answer:   Duane LopeEURE, LUTHER H [[id number]]    MDM - Acute abd pain in pregnancy w/ out evidence of SAB, cervical dilation, infection or emergent condition. Suspect constipation/gas.   ASSESSMENT 1. Abdominal pain during pregnancy in first trimester   2. Frequency of urination   3. Chronic constipation     PLAN Discharge home in stable condition. First trimester precautions Miralax PRN.  Follow-up Information    Department, [facility] in 3 week(s).   Why:  as scheduled for prenatal care Contact information: [address] [place] [id number] [phone]        [facility] Follow up.   Why:  in pregnancy emergencies Contact information: [address] [id number] [place] [id number] [phone]         Allergies as of 12/11/2016      Reactions   Coconut Oil Hives, Itching      Medication List    TAKE these medications    polyethylene glycol powder powder Commonly known as:  GLYCOLAX/MIRALAX Take 17 g by mouth daily as needed for moderate constipation.   prenatal multivitamin Tabs tablet Take 1 tablet by mouth daily at 12 noon.        Katrinka BlazingSmith, [place], [place] 12/11/2016  7:43 PM

## 2016-12-11 NOTE — MAU Note (Signed)
Pt c/o cramping that started today around 2pm. Pt states there is a constant dull pain with an intermittent sharp pain. Pt denies bleeding.

## 2016-12-13 LAB — CULTURE, OB URINE: SPECIAL REQUESTS: NORMAL

## 2017-01-02 LAB — OB RESULTS CONSOLE HIV ANTIBODY (ROUTINE TESTING): HIV: NONREACTIVE

## 2017-01-02 LAB — CYTOLOGY - PAP: Pap: NEGATIVE

## 2017-01-02 LAB — OB RESULTS CONSOLE ANTIBODY SCREEN: Antibody Screen: POSITIVE

## 2017-01-02 LAB — OB RESULTS CONSOLE HGB/HCT, BLOOD
HEMATOCRIT: 34
Hemoglobin: 10.8

## 2017-01-02 LAB — OB RESULTS CONSOLE VARICELLA ZOSTER ANTIBODY, IGG: VARICELLA IGG: IMMUNE

## 2017-01-02 LAB — OB RESULTS CONSOLE HEPATITIS B SURFACE ANTIGEN: HEP B S AG: NEGATIVE

## 2017-01-02 LAB — OB RESULTS CONSOLE RUBELLA ANTIBODY, IGM: RUBELLA: IMMUNE

## 2017-01-02 LAB — OB RESULTS CONSOLE PLATELET COUNT: Platelets: 252

## 2017-01-02 LAB — OB RESULTS CONSOLE ABO/RH: RH TYPE: POSITIVE

## 2017-01-11 ENCOUNTER — Encounter: Payer: Self-pay | Admitting: Family Medicine

## 2017-01-11 ENCOUNTER — Ambulatory Visit (INDEPENDENT_AMBULATORY_CARE_PROVIDER_SITE_OTHER): Payer: 59 | Admitting: Family Medicine

## 2017-01-11 ENCOUNTER — Encounter: Payer: Self-pay | Admitting: *Deleted

## 2017-01-11 VITALS — BP 112/68 | HR 79 | Wt 141.1 lb

## 2017-01-11 DIAGNOSIS — O0990 Supervision of high risk pregnancy, unspecified, unspecified trimester: Secondary | ICD-10-CM

## 2017-01-11 DIAGNOSIS — O24112 Pre-existing diabetes mellitus, type 2, in pregnancy, second trimester: Secondary | ICD-10-CM

## 2017-01-11 DIAGNOSIS — O24919 Unspecified diabetes mellitus in pregnancy, unspecified trimester: Secondary | ICD-10-CM

## 2017-01-11 DIAGNOSIS — O24912 Unspecified diabetes mellitus in pregnancy, second trimester: Secondary | ICD-10-CM

## 2017-01-11 DIAGNOSIS — O0992 Supervision of high risk pregnancy, unspecified, second trimester: Secondary | ICD-10-CM

## 2017-01-11 DIAGNOSIS — O24119 Pre-existing diabetes mellitus, type 2, in pregnancy, unspecified trimester: Secondary | ICD-10-CM | POA: Insufficient documentation

## 2017-01-11 LAB — POCT URINALYSIS DIP (DEVICE)
BILIRUBIN URINE: NEGATIVE
GLUCOSE, UA: NEGATIVE mg/dL
Hgb urine dipstick: NEGATIVE
KETONES UR: NEGATIVE mg/dL
LEUKOCYTES UA: NEGATIVE
Nitrite: NEGATIVE
Protein, ur: NEGATIVE mg/dL
Specific Gravity, Urine: 1.02 (ref 1.005–1.030)
Urobilinogen, UA: 2 mg/dL — ABNORMAL HIGH (ref 0.0–1.0)
pH: 7.5 (ref 5.0–8.0)

## 2017-01-11 NOTE — Progress Notes (Signed)
   PRENATAL VISIT NOTE  Subjective:  Cheyenne Lozano is a 26 y.o. G2P0010 at 282w6d being seen today for ongoing prenatal care.  She is currently monitored for the following issues for this low-risk pregnancy and has Breast hypertrophy in female; Type 2 diabetes mellitus affecting pregnancy, antepartum; and Supervision of high risk pregnancy, antepartum on her problem list. Diabetes is diet-controlled, patient not taking any medications. Doesn't check blood sugars at home and is unsure of last A1C.  Patient reports nausea and vomiting. Contractions: Not present. Vag. Bleeding: None.  Movement: Present. Denies leaking of fluid.   The following portions of the patient's history were reviewed and updated as appropriate: allergies, current medications, past family history, past medical history, past social history, past surgical history and problem list. Problem list updated.  Objective:   Vitals:   01/11/17 0831  BP: 112/68  Pulse: 79  Weight: 141 lb 1.6 oz (64 kg)    Fetal Status: Fetal Heart Rate (bpm): 142   Movement: Present     General:  Alert, oriented and cooperative. Patient is in no acute distress.  Skin: Skin is warm and dry. No rash noted.   Cardiovascular: Normal heart rate noted  Respiratory: Normal respiratory effort, no problems with respiration noted  Abdomen: Soft, gravid, appropriate for gestational age.  Pain/Pressure: Present     Pelvic: Cervical exam deferred        Extremities: Normal range of motion.  Edema: None  Mental Status:  Normal mood and affect. Normal behavior. Normal judgment and thought content.   Assessment and Plan:  Pregnancy: G2P0010 at 4882w6d  1. Diabetes mellitus during pregnancy, antepartum, diet-controlled GDMB - Hemoglobin A1c - US MFM OB DETAIL +14 WK; Future - Amb Referral to Nutrition and Diabetic E - Ambulatory referral to Pediatric Cardiology - Ambulatory referral to Ophthalmology  2. Supervision of high risk pregnancy,  antepartum -Recommended B6 for nausea and vomiting -Follow up in 4 weeks for routine OB visit. Will do Quad screen at next visit.   Preterm labor symptoms and general obstetric precautions including but not limited to vaginal bleeding, contractions, leaking of fluid and fetal movement were reviewed in detail with the patient. Please refer to After Visit Summary for other counseling recommendations.  No Follow-up on file.   Jeanie CooksSarah Dameer Speiser, Medical Student

## 2017-01-11 NOTE — Patient Instructions (Signed)
First Trimester of Pregnancy The first trimester of pregnancy is from week 1 until the end of week 13 (months 1 through 3). A week after a sperm fertilizes an egg, the egg will implant on the wall of the uterus. This embryo will begin to develop into a baby. Genes from you and your partner will form the baby. The female genes will determine whether the baby will be a boy or a girl. At 6-8 weeks, the eyes and face will be formed, and the heartbeat can be seen on ultrasound. At the end of 12 weeks, all the baby's organs will be formed. Now that you are pregnant, you will want to do everything you can to have a healthy baby. Two of the most important things are to get good prenatal care and to follow your health care provider's instructions. Prenatal care is all the medical care you receive before the baby's birth. This care will help prevent, find, and treat any problems during the pregnancy and childbirth. Body changes during your first trimester Your body goes through many changes during pregnancy. The changes vary from woman to woman.  You may gain or lose a couple of pounds at first.  You may feel sick to your stomach (nauseous) and you may throw up (vomit). If the vomiting is uncontrollable, call your health care provider.  You may tire easily.  You may develop headaches that can be relieved by medicines. All medicines should be approved by your health care provider.  You may urinate more often. Painful urination may mean you have a bladder infection.  You may develop heartburn as a result of your pregnancy.  You may develop constipation because certain hormones are causing the muscles that push stool through your intestines to slow down.  You may develop hemorrhoids or swollen veins (varicose veins).  Your breasts may begin to grow larger and become tender. Your nipples may stick out more, and the tissue that surrounds them (areola) may become darker.  Your gums may bleed and may be  sensitive to brushing and flossing.  Dark spots or blotches (chloasma, mask of pregnancy) may develop on your face. This will likely fade after the baby is born.  Your menstrual periods will stop.  You may have a loss of appetite.  You may develop cravings for certain kinds of food.  You may have changes in your emotions from day to day, such as being excited to be pregnant or being concerned that something may go wrong with the pregnancy and baby.  You may have more vivid and strange dreams.  You may have changes in your hair. These can include thickening of your hair, rapid growth, and changes in texture. Some women also have hair loss during or after pregnancy, or hair that feels dry or thin. Your hair will most likely return to normal after your baby is born.  What to expect at prenatal visits During a routine prenatal visit:  You will be weighed to make sure you and the baby are growing normally.  Your blood pressure will be taken.  Your abdomen will be measured to track your baby's growth.  The fetal heartbeat will be listened to between weeks 10 and 14 of your pregnancy.  Test results from any previous visits will be discussed.  Your health care provider may ask you:  How you are feeling.  If you are feeling the baby move.  If you have had any abnormal symptoms, such as leaking fluid, bleeding, severe headaches,   or abdominal cramping.  If you are using any tobacco products, including cigarettes, chewing tobacco, and electronic cigarettes.  If you have any questions.  Other tests that may be performed during your first trimester include:  Blood tests to find your blood type and to check for the presence of any previous infections. The tests will also be used to check for low iron levels (anemia) and protein on red blood cells (Rh antibodies). Depending on your risk factors, or if you previously had diabetes during pregnancy, you may have tests to check for high blood  sugar that affects pregnant women (gestational diabetes).  Urine tests to check for infections, diabetes, or protein in the urine.  An ultrasound to confirm the proper growth and development of the baby.  Fetal screens for spinal cord problems (spina bifida) and Down syndrome.  HIV (human immunodeficiency virus) testing. Routine prenatal testing includes screening for HIV, unless you choose not to have this test.  You may need other tests to make sure you and the baby are doing well.  Follow these instructions at home: Medicines  Follow your health care provider's instructions regarding medicine use. Specific medicines may be either safe or unsafe to take during pregnancy.  Take a prenatal vitamin that contains at least 600 micrograms (mcg) of folic acid.  If you develop constipation, try taking a stool softener if your health care provider approves. Eating and drinking  Eat a balanced diet that includes fresh fruits and vegetables, whole grains, good sources of protein such as meat, eggs, or tofu, and low-fat dairy. Your health care provider will help you determine the amount of weight gain that is right for you.  Avoid raw meat and uncooked cheese. These carry germs that can cause birth defects in the baby.  Eating four or five small meals rather than three large meals a day may help relieve nausea and vomiting. If you start to feel nauseous, eating a few soda crackers can be helpful. Drinking liquids between meals, instead of during meals, also seems to help ease nausea and vomiting.  Limit foods that are high in fat and processed sugars, such as fried and sweet foods.  To prevent constipation: ? Eat foods that are high in fiber, such as fresh fruits and vegetables, whole grains, and beans. ? Drink enough fluid to keep your urine clear or pale yellow. Activity  Exercise only as directed by your health care provider. Most women can continue their usual exercise routine during  pregnancy. Try to exercise for 30 minutes at least 5 days a week. Exercising will help you: ? Control your weight. ? Stay in shape. ? Be prepared for labor and delivery.  Experiencing pain or cramping in the lower abdomen or lower back is a good sign that you should stop exercising. Check with your health care provider before continuing with normal exercises.  Try to avoid standing for long periods of time. Move your legs often if you must stand in one place for a long time.  Avoid heavy lifting.  Wear low-heeled shoes and practice good posture.  You may continue to have sex unless your health care provider tells you not to. Relieving pain and discomfort  Wear a good support bra to relieve breast tenderness.  Take warm sitz baths to soothe any pain or discomfort caused by hemorrhoids. Use hemorrhoid cream if your health care provider approves.  Rest with your legs elevated if you have leg cramps or low back pain.  If you develop   varicose veins in your legs, wear support hose. Elevate your feet for 15 minutes, 3-4 times a day. Limit salt in your diet. Prenatal care  Schedule your prenatal visits by the twelfth week of pregnancy. They are usually scheduled monthly at first, then more often in the last 2 months before delivery.  Write down your questions. Take them to your prenatal visits.  Keep all your prenatal visits as told by your health care provider. This is important. Safety  Wear your seat belt at all times when driving.  Make a list of emergency phone numbers, including numbers for family, friends, the hospital, and police and fire departments. General instructions  Ask your health care provider for a referral to a local prenatal education class. Begin classes no later than the beginning of month 6 of your pregnancy.  Ask for help if you have counseling or nutritional needs during pregnancy. Your health care provider can offer advice or refer you to specialists for help  with various needs.  Do not use hot tubs, steam rooms, or saunas.  Do not douche or use tampons or scented sanitary pads.  Do not cross your legs for long periods of time.  Avoid cat litter boxes and soil used by cats. These carry germs that can cause birth defects in the baby and possibly loss of the fetus by miscarriage or stillbirth.  Avoid all smoking, herbs, alcohol, and medicines not prescribed by your health care provider. Chemicals in these products affect the formation and growth of the baby.  Do not use any products that contain nicotine or tobacco, such as cigarettes and e-cigarettes. If you need help quitting, ask your health care provider. You may receive counseling support and other resources to help you quit.  Schedule a dentist appointment. At home, brush your teeth with a soft toothbrush and be gentle when you floss. Contact a health care provider if:  You have dizziness.  You have mild pelvic cramps, pelvic pressure, or nagging pain in the abdominal area.  You have persistent nausea, vomiting, or diarrhea.  You have a bad smelling vaginal discharge.  You have pain when you urinate.  You notice increased swelling in your face, hands, legs, or ankles.  You are exposed to fifth disease or chickenpox.  You are exposed to German measles (rubella) and have never had it. Get help right away if:  You have a fever.  You are leaking fluid from your vagina.  You have spotting or bleeding from your vagina.  You have severe abdominal cramping or pain.  You have rapid weight gain or loss.  You vomit blood or material that looks like coffee grounds.  You develop a severe headache.  You have shortness of breath.  You have any kind of trauma, such as from a fall or a car accident. Summary  The first trimester of pregnancy is from week 1 until the end of week 13 (months 1 through 3).  Your body goes through many changes during pregnancy. The changes vary from  woman to woman.  You will have routine prenatal visits. During those visits, your health care provider will examine you, discuss any test results you may have, and talk with you about how you are feeling. This information is not intended to replace advice given to you by your health care provider. Make sure you discuss any questions you have with your health care provider. Document Released: 05/31/2001 Document Revised: 05/18/2016 Document Reviewed: 05/18/2016 Elsevier Interactive Patient Education  2017 [employer]  ---------------------------------------------------------------------------------------------------- For Prevention of Nausea and Vomiting: --Take Vitamin B6 four times a day (with breakfast, lunch, dinner and bedtime) --If persistent nausea and vomiting, you may add Unisom (1/2 tab) four times a day as well --Remain well-hydrated

## 2017-01-11 NOTE — Progress Notes (Signed)
New ob packet given Anatomy US scheduled for August 28th @ 1430.   Diabetes Education scheduled for 01/12/17 @ 1600.   Fetal Echo scheduled September 14th @ 1430.  [facility] @ [facility]15.  Pt notified of all appointments.

## 2017-01-11 NOTE — Progress Notes (Signed)
   PRENATAL VISIT NOTE  Subjective:  Cheyenne Lozano is a 26 y.o. G2P0010 at 6365w6d being seen today for transfer of prenatal care from the HD d/t high-risk pregnancy. Pregnancy complicated by has Type 2 diabetes mellitus affecting pregnancy, antepartum and Supervision of high risk pregnancy, antepartum on her problem list.   She reports being on metformin about 2 weeks ago, but had stopped taking and has not followed up since. Not checking blood sugars at home.   Patient reports no complaints.  Contractions: Not present. Vag. Bleeding: None.  Movement: Present. Denies leaking of fluid.   The following portions of the patient's history were reviewed and updated as appropriate: allergies, current medications, past family history, past medical history, past social history, past surgical history and problem list. Problem list updated.  Objective:   Vitals:   01/11/17 0831  BP: 112/68  Pulse: 79  Weight: 141 lb 1.6 oz (64 kg)    Fetal Status: Fetal Heart Rate (bpm): 142   Movement: Present     General:  Alert, oriented and cooperative. Patient is in no acute distress.  Skin: Skin is warm and dry. No rash noted.   Cardiovascular: Normal rate, regular rhythm, no murmur  Respiratory: Normal respiratory effort, lungs CTAB  Abdomen: Soft, gravid, appropriate for gestational age.  Pain/Pressure: Present     Pelvic: Cervical exam deferred        Extremities: Normal range of motion.  Edema: None  Mental Status:  Normal mood and affect. Normal behavior. Normal judgment and thought content.   Assessment and Plan:  Pregnancy: G2P0010 at 2965w6d  1. Supervision of high risk pregnancy, antepartum - Transfer of cared from [facility]. Records reviewed - Dicussed expected prenatal course - Referrals for diabetes as noted below - Follow up in 4 weeks   2. Type 2 diabetes mellitus affecting pregnancy, antepartum Note on any meds. - Hemoglobin A1c - US MFM OB DETAIL +14 WK; Future - Amb Referral to  Nutrition and Diabetic E -- tomorrow - Ambulatory referral to Pediatric Cardiology - Ambulatory referral to Ophthalmology   Return in about 4 weeks (around 02/08/2017).  Raynelle FanningJulie P. Vega Withrow, MD OB Fellow  Future Appointments Date Time Provider Department Center  01/12/2017 4:00 PM [facility] DIABETIC/CONSULT RM [facility]  02/09/2017 9:00 AM Lesly DukesLeggett, Kelly H, MD [facility]  02/14/2017 2:30 PM [facility] US 5 [facility]

## 2017-01-12 ENCOUNTER — Ambulatory Visit (HOSPITAL_COMMUNITY): Admission: RE | Admit: 2017-01-12 | Payer: 59 | Source: Ambulatory Visit

## 2017-01-12 LAB — HEMOGLOBIN A1C
ESTIMATED AVERAGE GLUCOSE: 105 mg/dL
Hgb A1c MFr Bld: 5.3 % (ref 4.8–5.6)

## 2017-01-20 ENCOUNTER — Encounter (HOSPITAL_COMMUNITY): Payer: Self-pay | Admitting: *Deleted

## 2017-01-20 ENCOUNTER — Encounter: Payer: Self-pay | Admitting: *Deleted

## 2017-01-20 ENCOUNTER — Inpatient Hospital Stay (HOSPITAL_COMMUNITY)
Admission: AD | Admit: 2017-01-20 | Discharge: 2017-01-20 | Disposition: A | Discharge status: Home / Self Care | Payer: 59 | Source: Ambulatory Visit | Attending: Obstetrics & Gynecology | Admitting: Obstetrics & Gynecology

## 2017-01-20 DIAGNOSIS — O219 Vomiting of pregnancy, unspecified: Secondary | ICD-10-CM | POA: Insufficient documentation

## 2017-01-20 DIAGNOSIS — Z3A16 16 weeks gestation of pregnancy: Secondary | ICD-10-CM | POA: Insufficient documentation

## 2017-01-20 DIAGNOSIS — K2100 Gastro-esophageal reflux disease with esophagitis, without bleeding: Secondary | ICD-10-CM

## 2017-01-20 DIAGNOSIS — O2412 Pre-existing diabetes mellitus, type 2, in childbirth: Secondary | ICD-10-CM | POA: Insufficient documentation

## 2017-01-20 DIAGNOSIS — E119 Type 2 diabetes mellitus without complications: Secondary | ICD-10-CM | POA: Insufficient documentation

## 2017-01-20 DIAGNOSIS — O99612 Diseases of the digestive system complicating pregnancy, second trimester: Secondary | ICD-10-CM | POA: Diagnosis not present

## 2017-01-20 LAB — CBC
HCT: 34.1 % — ABNORMAL LOW (ref 36.0–46.0)
Hemoglobin: 11.2 g/dL — ABNORMAL LOW (ref 12.0–15.0)
MCH: 27.5 pg (ref 26.0–34.0)
MCHC: 32.8 g/dL (ref 30.0–36.0)
MCV: 83.6 fL (ref 78.0–100.0)
PLATELETS: 252 10*3/uL (ref 150–400)
RBC: 4.08 MIL/uL (ref 3.87–5.11)
RDW: 13.6 % (ref 11.5–15.5)
WBC: 9.8 10*3/uL (ref 4.0–10.5)

## 2017-01-20 LAB — URINALYSIS, ROUTINE W REFLEX MICROSCOPIC
Bilirubin Urine: NEGATIVE
GLUCOSE, UA: NEGATIVE mg/dL
Hgb urine dipstick: NEGATIVE
KETONES UR: NEGATIVE mg/dL
Nitrite: NEGATIVE
PH: 6 (ref 5.0–8.0)
Protein, ur: 100 mg/dL — AB
SPECIFIC GRAVITY, URINE: 1.021 (ref 1.005–1.030)
WBC, UA: NONE SEEN WBC/hpf (ref 0–5)

## 2017-01-20 LAB — COMPREHENSIVE METABOLIC PANEL
ALK PHOS: 49 U/L (ref 38–126)
ALT: 10 U/L — AB (ref 14–54)
AST: 15 U/L (ref 15–41)
Albumin: 3.5 g/dL (ref 3.5–5.0)
Anion gap: 7 (ref 5–15)
BILIRUBIN TOTAL: 0.6 mg/dL (ref 0.3–1.2)
BUN: 7 mg/dL (ref 6–20)
CALCIUM: 9 mg/dL (ref 8.9–10.3)
CO2: 24 mmol/L (ref 22–32)
CREATININE: 0.52 mg/dL (ref 0.44–1.00)
Chloride: 104 mmol/L (ref 101–111)
GFR calc Af Amer: >60 mL/min (ref >60)
Glucose, Bld: 82 mg/dL (ref 65–99)
Potassium: 3.8 mmol/L (ref 3.5–5.1)
Sodium: 135 mmol/L (ref 135–145)
TOTAL PROTEIN: 6.6 g/dL (ref 6.5–8.1)

## 2017-01-20 MED ORDER — SUCRALFATE 1 GM/10ML PO SUSP: 1.0000 g | Freq: Three times a day (TID) | ORAL | 0 refills | Status: DC

## 2017-01-20 MED ORDER — ONDANSETRON 8 MG PO TBDP
8.0000 mg | ORAL_TABLET | Freq: Once | ORAL | Status: AC
  Administered 2017-01-20: 8 mg via ORAL
  Filled 2017-01-20: qty 1

## 2017-01-20 MED ORDER — SUCRALFATE 1 GM/10ML PO SUSP
1.0000 g | Freq: Three times a day (TID) | ORAL | Status: DC
  Administered 2017-01-20: 1 g via ORAL
  Filled 2017-01-20 (×3): qty 10

## 2017-01-20 MED ORDER — PROMETHAZINE HCL 25 MG PO TABS: 12.5000 mg | ORAL_TABLET | Freq: Four times a day (QID) | ORAL | 2 refills | Status: DC | PRN

## 2017-01-20 MED ORDER — ONDANSETRON 4 MG PO TBDP: 4.0000 mg | ORAL_TABLET | Freq: Four times a day (QID) | ORAL | 2 refills | Status: DC | PRN

## 2017-01-20 NOTE — MAU Note (Signed)
Pt stated she has been having abd pan (intestinal) for 2-3 days. Started vomited blood streaked emesis at work today ("mucus with blood clots").

## 2017-01-20 NOTE — MAU Note (Signed)
Pt getting bloodwork from lobby.

## 2017-01-20 NOTE — MAU Provider Note (Signed)
History     CSN: [id number]  Arrival date and time: 01/20/17 1828   None     Chief Complaint  Patient presents with  . Emesis    vomiting blood  . Abdominal Pain   26 yo G2P0010 with type II DM presenting at 4116 and 1 wiith vomiting   Per the pt at 1800 she noticed blood in her vomit. She has been vomiting the entire pregnancy and periodically sees blood in the vomit but now saw several blood clots in her vomit. Has nausea 8/10 sharp epigastric pain worse at night. She reports the pain is worse when she lye down flat. At night when it hurts sitting up makes the pain better. She denies chest pain, SOB, coughing,halatosis. For the nausea she does not take anything. She reports only taking prenatals. She also reports dizziness and light headedness when stand from seated and she look one direction too fast.      OB History    Gravida Para Term Preterm AB Living   2       1     SAB TAB Ectopic Multiple Live Births   1              Past Medical History:  Diagnosis Date  . Breast hypertrophy in female 02/26/2015  . Type II diabetes mellitus (HCC)    "actually I'm prediabetic; dr. put me on RX to get A1C #'s down" (02/26/2015)    Past Surgical History:  Procedure Laterality Date  . BREAST REDUCTION SURGERY Bilateral 02/26/2015   Procedure: BILATERAL BREAST REDUCTION  ;  Surgeon: Etter Sjogrenavid Bowers, MD;  [facility];  Service: Plastics;  Laterality: Bilateral;  . REDUCTION MAMMAPLASTY Bilateral 02/26/2015  . WISDOM TOOTH EXTRACTION  ~ 2010   "bottom 2"    Family History  Problem Relation Age of Onset  . Diabetes Father   . Hypertension Father   . Diabetes Maternal Grandmother   . Hypertension Maternal Grandmother   . Diabetes Maternal Grandfather   . Hypertension Maternal Grandfather     Social History  Substance Use Topics  . Smoking status: Never Smoker  . Smokeless tobacco: Never Used  . Alcohol use Yes     Comment: not since pregnancy    Allergies:  Allergies   Allergen Reactions  . Coconut Oil Hives and Itching    Prescriptions Prior to Admission  Medication Sig Dispense Refill Last Dose  . polyethylene glycol powder (GLYCOLAX/MIRALAX) powder Take 17 g by mouth daily as needed for moderate constipation. 500 g 2 Taking  . Prenatal Vit-Fe Fumarate-FA (PRENATAL MULTIVITAMIN) TABS tablet Take 1 tablet by mouth daily at 12 noon.   Taking    Review of Systems  Constitutional: Negative for chills and fever.  Respiratory: Negative for cough and shortness of breath.   Cardiovascular: Negative for chest pain.  Gastrointestinal: Positive for abdominal distention, abdominal pain, nausea and vomiting. Negative for blood in stool, constipation and diarrhea.  Genitourinary: Negative for hematuria.  Neurological: Positive for dizziness and light-headedness.   Physical Exam   Blood pressure 122/69, pulse 72, temperature 98.7 F (37.1 C), height 4\' 11"  (1.499 m), weight 65.3 kg (144 lb), last menstrual period 09/29/2016.  Physical Exam  Constitutional: She appears well-developed and well-nourished.  HENT:  Head: Normocephalic and atraumatic.  Neck: Normal range of motion.  Cardiovascular: Normal rate and regular rhythm.   Respiratory: Effort normal and breath sounds normal.  GI: Soft. Normal appearance. There is tenderness in the epigastric area.  FHT 153 by doppler  MAU Course  Procedures  MDM CBC CMP ondansetron in ER  Assessment and Plan  She is presenting with nausea nd vomiting in pregnancy concerning for mallory wise tear vs GERD vs peptic ulcer vs nausea and vomiting in pregnancy. Pt appears stable and non toxic in ER. Symptomatic treatment and presumptive treatment for GERD and nausea were undertaken in ER.  Pt will be sent home with ondansetron and promethazine for use Prn as well as omeprazole. F/u out pt.  Sullivan LoneBrannon L Inman 01/20/2017, 7:27 PM   A: 1. Nausea and vomiting in pregnancy   2. Gastroesophageal reflux disease with  esophagitis     P: D/C home Rx for Carafate, Zofran, and Phenergan Keep prenatal appointments Return to MAU as needed for emergencies  I confirm that I have verified the information documented in the medical student's note and that I have also personally performed the physical exam and all medical decision making activities.Emesis with small amount of bleeding and pt well appearing with good response to nausea and GERD medications so unlikely mallory wise tear or peptic ulcer.  Most likely esophagitis from vomiting/GERD.  CMP with normal anion gap so no evidence of DKA.  Sharen CounterLisa Leftwich-Kirby, CNM 8:48 PM

## 2017-02-09 ENCOUNTER — Encounter: Payer: 59 | Admitting: Obstetrics & Gynecology

## 2017-02-14 ENCOUNTER — Ambulatory Visit (HOSPITAL_COMMUNITY)
Admission: RE | Admit: 2017-02-14 | Discharge: 2017-02-14 | Disposition: A | Discharge status: Home / Self Care | Payer: 59 | Source: Ambulatory Visit | Attending: Family Medicine | Admitting: Family Medicine

## 2017-02-14 ENCOUNTER — Encounter (HOSPITAL_COMMUNITY): Payer: Self-pay

## 2017-02-14 ENCOUNTER — Other Ambulatory Visit (HOSPITAL_COMMUNITY): Payer: Self-pay | Admitting: *Deleted

## 2017-02-14 ENCOUNTER — Ambulatory Visit (INDEPENDENT_AMBULATORY_CARE_PROVIDER_SITE_OTHER): Payer: 59 | Admitting: Medical

## 2017-02-14 ENCOUNTER — Ambulatory Visit (HOSPITAL_COMMUNITY): Payer: 59

## 2017-02-14 ENCOUNTER — Other Ambulatory Visit: Payer: Self-pay | Admitting: Family Medicine

## 2017-02-14 VITALS — BP 106/62 | HR 58 | Wt 144.4 lb

## 2017-02-14 DIAGNOSIS — Z3A19 19 weeks gestation of pregnancy: Secondary | ICD-10-CM | POA: Insufficient documentation

## 2017-02-14 DIAGNOSIS — O24119 Pre-existing diabetes mellitus, type 2, in pregnancy, unspecified trimester: Secondary | ICD-10-CM

## 2017-02-14 DIAGNOSIS — O359XX0 Maternal care for (suspected) fetal abnormality and damage, unspecified, not applicable or unspecified: Secondary | ICD-10-CM | POA: Diagnosis present

## 2017-02-14 DIAGNOSIS — O24912 Unspecified diabetes mellitus in pregnancy, second trimester: Secondary | ICD-10-CM | POA: Diagnosis present

## 2017-02-14 DIAGNOSIS — Z3689 Encounter for other specified antenatal screening: Secondary | ICD-10-CM | POA: Insufficient documentation

## 2017-02-14 DIAGNOSIS — O219 Vomiting of pregnancy, unspecified: Secondary | ICD-10-CM

## 2017-02-14 DIAGNOSIS — O24112 Pre-existing diabetes mellitus, type 2, in pregnancy, second trimester: Secondary | ICD-10-CM

## 2017-02-14 DIAGNOSIS — O09892 Supervision of other high risk pregnancies, second trimester: Secondary | ICD-10-CM | POA: Diagnosis not present

## 2017-02-14 DIAGNOSIS — O0990 Supervision of high risk pregnancy, unspecified, unspecified trimester: Secondary | ICD-10-CM

## 2017-02-14 DIAGNOSIS — O24919 Unspecified diabetes mellitus in pregnancy, unspecified trimester: Secondary | ICD-10-CM

## 2017-02-14 LAB — POCT URINALYSIS DIP (DEVICE)
BILIRUBIN URINE: NEGATIVE
GLUCOSE, UA: NEGATIVE mg/dL
Hgb urine dipstick: NEGATIVE
KETONES UR: NEGATIVE mg/dL
LEUKOCYTES UA: NEGATIVE
NITRITE: NEGATIVE
PROTEIN: NEGATIVE mg/dL
SPECIFIC GRAVITY, URINE: 1.015 (ref 1.005–1.030)
UROBILINOGEN UA: 1 mg/dL (ref 0.0–1.0)
pH: 8 (ref 5.0–8.0)

## 2017-02-14 MED ORDER — GLUCOSE BLOOD VI STRP: ORAL_STRIP | 12 refills | Status: DC

## 2017-02-14 MED ORDER — ACCU-CHEK FASTCLIX LANCETS MISC: 1.0000 | Freq: Four times a day (QID) | 12 refills | Status: DC

## 2017-02-14 MED ORDER — ACCU-CHEK NANO SMARTVIEW W/DEVICE KIT: 1.0000 | PACK | Freq: Every day | 0 refills | Status: DC

## 2017-02-14 MED ORDER — ONDANSETRON 4 MG PO TBDP: 4.0000 mg | ORAL_TABLET | Freq: Four times a day (QID) | ORAL | 2 refills | Status: DC | PRN

## 2017-02-14 NOTE — Patient Instructions (Addendum)
How a Baby Grows During Pregnancy Pregnancy begins when a female's sperm enters a female's egg (fertilization). This happens in one of the tubes (fallopian tubes) that connect the ovaries to the womb (uterus). The fertilized egg is called an embryo until it reaches 10 weeks. From 10 weeks until birth, it is called a fetus. The fertilized egg moves down the fallopian tube to the uterus. Then it implants into the lining of the uterus and begins to grow. The developing fetus receives oxygen and nutrients through the pregnant woman's bloodstream and the tissues that grow (placenta) to support the fetus. The placenta is the life support system for the fetus. It provides nutrition and removes waste. Learning as much as you can about your pregnancy and how your baby is developing can help you enjoy the experience. It can also make you aware of when there might be a problem and when to ask questions. How long does a typical pregnancy last? A pregnancy usually lasts 280 days, or about 40 weeks. Pregnancy is divided into three trimesters:  First trimester: 0-13 weeks.  Second trimester: 14-27 weeks.  Third trimester: 28-40 weeks.  The day when your baby is considered ready to be born (full term) is your estimated date of delivery. How does my baby develop month by month? First month  The fertilized egg attaches to the inside of the uterus.  Some cells will form the placenta. Others will form the fetus.  The arms, legs, brain, spinal cord, lungs, and heart begin to develop.  At the end of the first month, the heart begins to beat.  Second month  The bones, inner ear, eyelids, hands, and feet form.  The genitals develop.  By the end of 8 weeks, all major organs are developing.  Third month  All of the internal organs are forming.  Teeth develop below the gums.  Bones and muscles begin to grow. The spine can flex.  The skin is transparent.  Fingernails and toenails begin to form.  Arms  and legs continue to grow longer, and hands and feet develop.  The fetus is about 3 in (7.6 cm) long.  Fourth month  The placenta is completely formed.  The external sex organs, neck, outer ear, eyebrows, eyelids, and fingernails are formed.  The fetus can hear, swallow, and move its arms and legs.  The kidneys begin to produce urine.  The skin is covered with a white waxy coating (vernix) and very fine hair (lanugo).  Fifth month  The fetus moves around more and can be felt for the first time (quickening).  The fetus starts to sleep and wake up and may begin to suck its finger.  The nails grow to the end of the fingers.  The organ in the digestive system that makes bile (gallbladder) functions and helps to digest the nutrients.  If your baby is a girl, eggs are present in her ovaries. If your baby is a boy, testicles start to move down into his scrotum.  Sixth month  The lungs are formed, but the fetus is not yet able to breathe.  The eyes open. The brain continues to develop.  Your baby has fingerprints and toe prints. Your baby's hair grows thicker.  At the end of the second trimester, the fetus is about 9 in (22.9 cm) long.  Seventh month  The fetus kicks and stretches.  The eyes are developed enough to sense changes in light.  The hands can make a grasping motion.  The   fetus responds to sound.  Eighth month  All organs and body systems are fully developed and functioning.  Bones harden and taste buds develop. The fetus may hiccup.  Certain areas of the brain are still developing. The skull remains soft.  Ninth month  The fetus gains about  lb (0.23 kg) each week.  The lungs are fully developed.  Patterns of sleep develop.  The fetus's head typically moves into a head-down position (vertex) in the uterus to prepare for birth. If the buttocks move into a vertex position instead, the baby is breech.  The fetus weighs 6-9 lbs (2.72-4.08 kg) and is  19-20 in (48.26-50.8 cm) long.  What can I do to have a healthy pregnancy and help my baby develop? Eating and Drinking  Eat a healthy diet. ? Talk with your health care provider to make sure that you are getting the nutrients that you and your baby need. ? Visit www.[employer].be to learn about creating a healthy diet.  Gain a healthy amount of weight during pregnancy as advised by your health care provider. This is usually 25-35 pounds. You may need to: ? Gain more if you were underweight before getting pregnant or if you are pregnant with more than one baby. ? Gain less if you were overweight or obese when you got pregnant.  Medicines and Vitamins  Take prenatal vitamins as directed by your health care provider. These include vitamins such as folic acid, iron, calcium, and vitamin D. They are important for healthy development.  Take medicines only as directed by your health care provider. Read labels and ask a pharmacist or your health care provider whether over-the-counter medicines, supplements, and prescription drugs are safe to take during pregnancy.  Activities  Be physically active as advised by your health care provider. Ask your health care provider to recommend activities that are safe for you to do, such as walking or swimming.  Do not participate in strenuous or extreme sports.  Lifestyle  Do not drink alcohol.  Do not use any tobacco products, including cigarettes, chewing tobacco, or electronic cigarettes. If you need help quitting, ask your health care provider.  Do not use illegal drugs.  Safety  Avoid exposure to mercury, lead, or other heavy metals. Ask your health care provider about common sources of these heavy metals.  Avoid listeria infection during pregnancy. Follow these precautions: ? Do not eat soft cheeses or deli meats. ? Do not eat hot dogs unless they have been warmed up to the point of steaming, such as in the microwave oven. ? Do not  drink unpasteurized milk.  Avoid toxoplasmosis infection during pregnancy. Follow these precautions: ? Do not change your cat's litter box, if you have a cat. Ask someone else to do this for you. ? Wear gardening gloves while working in the yard.  General Instructions  Keep all follow-up visits as directed by your health care provider. This is important. This includes prenatal care and screening tests.  Manage any chronic health conditions. Work closely with your health care provider to keep conditions, such as diabetes, under control.  How do I know if my baby is developing well? At each prenatal visit, your health care provider will do several different tests to check on your health and keep track of your baby's development. These include:  Fundal height. Your health care provider will me Blood Glucose Monitoring, Adult Monitoring your blood sugar (glucose) helps you manage your diabetes. It also helps you and your health care  provider determine how well your diabetes management plan is working. Blood glucose monitoring involves checking your blood glucose as often as directed, and keeping a record (log) of your results over time. Why should I monitor my blood glucose? Checking your blood glucose regularly can:  Help you understand how food, exercise, illnesses, and medicines affect your blood glucose.  Let you know what your blood glucose is at any time. You can quickly tell if you are having low blood glucose (hypoglycemia) or high blood glucose (hyperglycemia).  Help you and your health care provider adjust your medicines as needed.  When should I check my blood glucose? Follow instructions from your health care provider about how often to check your blood glucose. This may depend on:  The type of diabetes you have.  How well-controlled your diabetes is.  Medicines you are taking.  If you have type 1 diabetes:  Check your blood glucose at least 2 times a day.  Also check  your blood glucose: ? Before every insulin injection. ? Before and after exercise. ? Between meals. ? 2 hours after a meal. ? Occasionally between 2:00 a.m. and 3:00 a.m., as directed. ? Before potentially dangerous tasks, like driving or using heavy machinery. ? At bedtime.  You may need to check your blood glucose more often, up to 6-10 times a day: ? If you use an insulin pump. ? If you need multiple daily injections (MDI). ? If your diabetes is not well-controlled. ? If you are ill. ? If you have a history of severe hypoglycemia. ? If you have a history of not knowing when your blood glucose is getting low (hypoglycemia unawareness). If you have type 2 diabetes:  If you take insulin or other diabetes medicines, check your blood glucose at least 2 times a day.  If you are on intensive insulin therapy, check your blood glucose at least 4 times a day. Occasionally, you may also need to check between 2:00 a.m. and 3:00 a.m., as directed.  Also check your blood glucose: ? Before and after exercise. ? Before potentially dangerous tasks, like driving or using heavy machinery.  You may need to check your blood glucose more often if: ? Your medicine is being adjusted. ? Your diabetes is not well-controlled. ? You are ill. What is a blood glucose log?  A blood glucose log is a record of your blood glucose readings. It helps you and your health care provider: ? Look for patterns in your blood glucose over time. ? Adjust your diabetes management plan as needed.  Every time you check your blood glucose, write down your result and notes about things that may be affecting your blood glucose, such as your diet and exercise for the day.  Most glucose meters store a record of glucose readings in the meter. Some meters allow you to download your records to a computer. How do I check my blood glucose? Follow these steps to get accurate readings of your blood glucose: Supplies  needed   Blood glucose meter.  Test strips for your meter. Each meter has its own strips. You must use the strips that come with your meter.  A needle to prick your finger (lancet). Do not use lancets more than once.  A device that holds the lancet (lancing device).  A journal or log book to write down your results. Procedure  Wash your hands with soap and water.  Prick the side of your finger (not the tip) with the lancet. Use  a different finger each time.  Gently rub the finger until a small drop of blood appears.  Follow instructions that come with your meter for inserting the test strip, applying blood to the strip, and using your blood glucose meter.  Write down your result and any notes. Alternative testing sites  Some meters allow you to use areas of your body other than your finger (alternative sites) to test your blood.  If you think you may have hypoglycemia, or if you have hypoglycemia unawareness, do not use alternative sites. Use your finger instead.  Alternative sites may not be as accurate as the fingers, because blood flow is slower in these areas. This means that the result you get may be delayed, and it may be different from the result that you would get from your finger.  The most common alternative sites are: ? Forearm. ? Thigh. ? Palm of the hand. Additional tips  Always keep your supplies with you.  If you have questions or need help, all blood glucose meters have a 24-hour "hotline" number that you can call. You may also contact your health care provider.  After you use a few boxes of test strips, adjust (calibrate) your blood glucose meter by following instructions that came with your meter. This information is not intended to replace advice given to you by your health care provider. Make sure you discuss any questions you have with your health care provider. Document Released: 06/09/2003 Document Revised: 12/25/2015 Document Reviewed:  11/16/2015 Elsevier Interactive Patient Education  2017 [employer]. ? asure your growing belly from top to bottom using a tape measure. ? Your health care provider will also feel your belly to determine your baby's position.  Heartbeat. ? An ultrasound in the first trimester can confirm pregnancy and show a heartbeat, depending on how far along you are. ? Your health care provider will check your baby's heart rate at every prenatal visit. ? As you get closer to your delivery date, you may have regular fetal heart rate monitoring to make sure that your baby is not in distress.  Second trimester ultrasound. ? This ultrasound checks your baby's development. It also indicates your baby's gender.  What should I do if I have concerns about my baby's development? Always talk with your health care provider about any concerns that you may have. This information is not intended to replace advice given to you by your health care provider. Make sure you discuss any questions you have with your health care provider. Document Released: 11/23/2007 Document Revised: 11/12/2015 Document Reviewed: 11/13/2013 Elsevier Interactive Patient Education  [employer].

## 2017-02-14 NOTE — Progress Notes (Signed)
   PRENATAL VISIT NOTE  Subjective:  Cheyenne Lozano is a 26 y.o. G2P0010 at 66w5dbeing seen today for ongoing prenatal care.  She is currently monitored for the following issues for this high-risk pregnancy and has Type 2 diabetes mellitus affecting pregnancy, antepartum and Supervision of high risk pregnancy, antepartum on her problem list.  Patient reports nausea.  Contractions: Not present. Vag. Bleeding: None.  Movement: Present. Denies leaking of fluid.   The following portions of the patient's history were reviewed and updated as appropriate: allergies, current medications, past family history, past medical history, past social history, past surgical history and problem list. Problem list updated.  Objective:   Vitals:   02/14/17 1054  BP: 106/62  Pulse: (!) 58  Weight: 144 lb 6.4 oz (65.5 kg)    Fetal Status: Fetal Heart Rate (bpm): 136 Fundal Height: 19 cm Movement: Present     General:  Alert, oriented and cooperative. Patient is in no acute distress.  Skin: Skin is warm and dry. No rash noted.   Cardiovascular: Normal heart rate noted  Respiratory: Normal respiratory effort, no problems with respiration noted  Abdomen: Soft, gravid, appropriate for gestational age.  Pain/Pressure: Absent     Pelvic: Cervical exam deferred        Extremities: Normal range of motion.  Edema: Trace  Mental Status:  Normal mood and affect. Normal behavior. Normal judgment and thought content.   Assessment and Plan:  Pregnancy: G2P0010 at 172w5d1. Supervision of high risk pregnancy, antepartum - Doing well - [place] from earlier today reviewed - choroid plexus noted and patient informed, since quad/NIPS has not been done, MFM can follow on growth scan in 4 weeks and counsel patient at that time  2. Type 2 diabetes mellitus affecting pregnancy, antepartum - Patient's recent monitor broke, has not been checking CBG for a few weeks, not currently on medications - Supplies sent as noted  below, advised to check AM, PP values daily and bring log to each visit - Will have patient return in 2 weeks since we have no recent values for CBG monitoring  - Blood Glucose Monitoring Suppl (ACCU-CHEK NANO SMARTVIEW) w/Device KIT; 1 Device by Does not apply route daily.  Dispense: 1 kit; Refill: 0 - glucose blood test strip; Use as instructed  Dispense: 100 each; Refill: 12 - ACCU-CHEK FASTCLIX LANCETS MISC; 1 Device by Percutaneous route 4 (four) times daily.  Dispense: 100 each; Refill: 12  3. Nausea and vomiting in pregnancy prior to [redacted] weeks gestation - Continues nausea, occasional vomiting, starting to gain weight now, feels well-controlled with Zofran - ondansetron (ZOFRAN ODT) 4 MG disintegrating tablet; Take 1 tablet (4 mg total) by mouth every 6 (six) hours as needed for nausea.  Dispense: 20 tablet; Refill: 2  Preterm labor symptoms and general obstetric precautions including but not limited to vaginal bleeding, contractions, leaking of fluid and fetal movement were reviewed in detail with the patient. Please refer to After Visit Summary for other counseling recommendations.  Return in about 2 weeks (around 02/28/2017) [facility]   JuKerry HoughPA-C

## 2017-02-16 ENCOUNTER — Other Ambulatory Visit: Payer: 59

## 2017-03-02 ENCOUNTER — Ambulatory Visit (INDEPENDENT_AMBULATORY_CARE_PROVIDER_SITE_OTHER): Payer: 59 | Admitting: Family Medicine

## 2017-03-02 VITALS — BP 125/74 | HR 94 | Wt 148.0 lb

## 2017-03-02 DIAGNOSIS — O0990 Supervision of high risk pregnancy, unspecified, unspecified trimester: Secondary | ICD-10-CM

## 2017-03-02 DIAGNOSIS — Z23 Encounter for immunization: Secondary | ICD-10-CM

## 2017-03-02 DIAGNOSIS — O24112 Pre-existing diabetes mellitus, type 2, in pregnancy, second trimester: Secondary | ICD-10-CM

## 2017-03-02 DIAGNOSIS — O0992 Supervision of high risk pregnancy, unspecified, second trimester: Secondary | ICD-10-CM

## 2017-03-02 DIAGNOSIS — O24119 Pre-existing diabetes mellitus, type 2, in pregnancy, unspecified trimester: Secondary | ICD-10-CM

## 2017-03-02 NOTE — Progress Notes (Signed)
   PRENATAL VISIT NOTE  Subjective:  Cheyenne Lozano is a 26 y.o. G2P0010 at 3896w0d being seen today for ongoing prenatal care.  She is currently monitored for the following issues for this high-risk pregnancy and has Type 2 diabetes mellitus affecting pregnancy, antepartum and Supervision of high risk pregnancy, antepartum on her problem list.  Patient reports no complaints and not checking CBGs due to Medicaid not covering her supplies..  Contractions: Not present. Vag. Bleeding: None.  Movement: Present. Denies leaking of fluid.   The following portions of the patient's history were reviewed and updated as appropriate: allergies, current medications, past family history, past medical history, past social history, past surgical history and problem list. Problem list updated.  Objective:   Vitals:   03/02/17 1426  BP: 125/74  Pulse: 94  Weight: 148 lb (67.1 kg)    Fetal Status: Fetal Heart Rate (bpm): 145 Fundal Height: 22 cm Movement: Present     General:  Alert, oriented and cooperative. Patient is in no acute distress.  Skin: Skin is warm and dry. No rash noted.   Cardiovascular: Normal heart rate noted  Respiratory: Normal respiratory effort, no problems with respiration noted  Abdomen: Soft, gravid, appropriate for gestational age.  Pain/Pressure: Absent     Pelvic: Cervical exam deferred        Extremities: Normal range of motion.  Edema: None  Mental Status:  Normal mood and affect. Normal behavior. Normal judgment and thought content.   Assessment and Plan:  Pregnancy: G2P0010 at 3196w0d  1. Supervision of high risk pregnancy, antepartum Quad screening today Normal anatomy - AFP TETRA - Flu Vaccine QUAD 36+ mos IM  2. Type 2 diabetes in Pregnancy Advised 3 meals, 3 snacks and exercise Protein with each meal Get meter Monday and bring log next time If CBG is > 200 call for guidance Fetal ECHO scheduled next Friday  General obstetric precautions including but not  limited to vaginal bleeding, contractions, leaking of fluid and fetal movement were reviewed in detail with the patient. Please refer to After Visit Summary for other counseling recommendations.  Return in 2 weeks (on 03/16/2017) for CBG check.   Reva Boresanya S Aleigh Grunden, MD

## 2017-03-02 NOTE — Patient Instructions (Signed)
 Second Trimester of Pregnancy The second trimester is from week 14 through week 27 (months 4 through 6). The second trimester is often a time when you feel your best. Your body has adjusted to being pregnant, and you begin to feel better physically. Usually, morning sickness has lessened or quit completely, you may have more energy, and you may have an increase in appetite. The second trimester is also a time when the fetus is growing rapidly. At the end of the sixth month, the fetus is about 9 inches long and weighs about 1 pounds. You will likely begin to feel the baby move (quickening) between 16 and 20 weeks of pregnancy. Body changes during your second trimester Your body continues to go through many changes during your second trimester. The changes vary from woman to woman.  Your weight will continue to increase. You will notice your lower abdomen bulging out.  You may begin to get stretch marks on your hips, abdomen, and breasts.  You may develop headaches that can be relieved by medicines. The medicines should be approved by your health care provider.  You may urinate more often because the fetus is pressing on your bladder.  You may develop or continue to have heartburn as a result of your pregnancy.  You may develop constipation because certain hormones are causing the muscles that push waste through your intestines to slow down.  You may develop hemorrhoids or swollen, bulging veins (varicose veins).  You may have back pain. This is caused by: ? Weight gain. ? Pregnancy hormones that are relaxing the joints in your pelvis. ? A shift in weight and the muscles that support your balance.  Your breasts will continue to grow and they will continue to become tender.  Your gums may bleed and may be sensitive to brushing and flossing.  Dark spots or blotches (chloasma, mask of pregnancy) may develop on your face. This will likely fade after the baby is born.  A dark line from  your belly button to the pubic area (linea nigra) may appear. This will likely fade after the baby is born.  You may have changes in your hair. These can include thickening of your hair, rapid growth, and changes in texture. Some women also have hair loss during or after pregnancy, or hair that feels dry or thin. Your hair will most likely return to normal after your baby is born.  What to expect at prenatal visits During a routine prenatal visit:  You will be weighed to make sure you and the fetus are growing normally.  Your blood pressure will be taken.  Your abdomen will be measured to track your baby's growth.  The fetal heartbeat will be listened to.  Any test results from the previous visit will be discussed.  Your health care provider may ask you:  How you are feeling.  If you are feeling the baby move.  If you have had any abnormal symptoms, such as leaking fluid, bleeding, severe headaches, or abdominal cramping.  If you are using any tobacco products, including cigarettes, chewing tobacco, and electronic cigarettes.  If you have any questions.  Other tests that may be performed during your second trimester include:  Blood tests that check for: ? Low iron levels (anemia). ? High blood sugar that affects pregnant women (gestational diabetes) between 24 and 28 weeks. ? Rh antibodies. This is to check for a protein on red blood cells (Rh factor).  Urine tests to check for infections, diabetes,   or protein in the urine.  An ultrasound to confirm the proper growth and development of the baby.  An amniocentesis to check for possible genetic problems.  Fetal screens for spina bifida and Down syndrome.  HIV (human immunodeficiency virus) testing. Routine prenatal testing includes screening for HIV, unless you choose not to have this test.  Follow these instructions at home: Medicines  Follow your health care provider's instructions regarding medicine use. Specific  medicines may be either safe or unsafe to take during pregnancy.  Take a prenatal vitamin that contains at least 600 micrograms (mcg) of folic acid.  If you develop constipation, try taking a stool softener if your health care provider approves. Eating and drinking  Eat a balanced diet that includes fresh fruits and vegetables, whole grains, good sources of protein such as meat, eggs, or tofu, and low-fat dairy. Your health care provider will help you determine the amount of weight gain that is right for you.  Avoid raw meat and uncooked cheese. These carry germs that can cause birth defects in the baby.  If you have low calcium intake from food, talk to your health care provider about whether you should take a daily calcium supplement.  Limit foods that are high in fat and processed sugars, such as fried and sweet foods.  To prevent constipation: ? Drink enough fluid to keep your urine clear or pale yellow. ? Eat foods that are high in fiber, such as fresh fruits and vegetables, whole grains, and beans. Activity  Exercise only as directed by your health care provider. Most women can continue their usual exercise routine during pregnancy. Try to exercise for 30 minutes at least 5 days a week. Stop exercising if you experience uterine contractions.  Avoid heavy lifting, wear low heel shoes, and practice good posture.  A sexual relationship may be continued unless your health care provider directs you otherwise. Relieving pain and discomfort  Wear a good support bra to prevent discomfort from breast tenderness.  Take warm sitz baths to soothe any pain or discomfort caused by hemorrhoids. Use hemorrhoid cream if your health care provider approves.  Rest with your legs elevated if you have leg cramps or low back pain.  If you develop varicose veins, wear support hose. Elevate your feet for 15 minutes, 3-4 times a day. Limit salt in your diet. Prenatal Care  Write down your questions.  Take them to your prenatal visits.  Keep all your prenatal visits as told by your health care provider. This is important. Safety  Wear your seat belt at all times when driving.  Make a list of emergency phone numbers, including numbers for family, friends, the hospital, and police and fire departments. General instructions  Ask your health care provider for a referral to a local prenatal education class. Begin classes no later than the beginning of month 6 of your pregnancy.  Ask for help if you have counseling or nutritional needs during pregnancy. Your health care provider can offer advice or refer you to specialists for help with various needs.  Do not use hot tubs, steam rooms, or saunas.  Do not douche or use tampons or scented sanitary pads.  Do not cross your legs for long periods of time.  Avoid cat litter boxes and soil used by cats. These carry germs that can cause birth defects in the baby and possibly loss of the fetus by miscarriage or stillbirth.  Avoid all smoking, herbs, alcohol, and unprescribed drugs. Chemicals in these products   can affect the formation and growth of the baby.  Do not use any products that contain nicotine or tobacco, such as cigarettes and e-cigarettes. If you need help quitting, ask your health care provider.  Visit your dentist if you have not gone yet during your pregnancy. Use a soft toothbrush to brush your teeth and be gentle when you floss. Contact a health care provider if:  You have dizziness.  You have mild pelvic cramps, pelvic pressure, or nagging pain in the abdominal area.  You have persistent nausea, vomiting, or diarrhea.  You have a bad smelling vaginal discharge.  You have pain when you urinate. Get help right away if:  You have a fever.  You are leaking fluid from your vagina.  You have spotting or bleeding from your vagina.  You have severe abdominal cramping or pain.  You have rapid weight gain or weight  loss.  You have shortness of breath with chest pain.  You notice sudden or extreme swelling of your face, hands, ankles, feet, or legs.  You have not felt your baby move in over an hour.  You have severe headaches that do not go away when you take medicine.  You have vision changes. Summary  The second trimester is from week 14 through week 27 (months 4 through 6). It is also a time when the fetus is growing rapidly.  Your body goes through many changes during pregnancy. The changes vary from woman to woman.  Avoid all smoking, herbs, alcohol, and unprescribed drugs. These chemicals affect the formation and growth your baby.  Do not use any tobacco products, such as cigarettes, chewing tobacco, and e-cigarettes. If you need help quitting, ask your health care provider.  Contact your health care provider if you have any questions. Keep all prenatal visits as told by your health care provider. This is important. This information is not intended to replace advice given to you by your health care provider. Make sure you discuss any questions you have with your health care provider. Document Released: 05/31/2001 Document Revised: 11/12/2015 Document Reviewed: 08/07/2012 [employer] Interactive Patient Education  2017 [employer]   Breastfeeding Deciding to breastfeed is one of the best choices you can make for you and your baby. A change in hormones during pregnancy causes your breast tissue to grow and increases the number and size of your milk ducts. These hormones also allow proteins, sugars, and fats from your blood supply to make breast milk in your milk-producing glands. Hormones prevent breast milk from being released before your baby is born as well as prompt milk flow after birth. Once breastfeeding has begun, thoughts of your baby, as well as his or her sucking or crying, can stimulate the release of milk from your milk-producing glands. Benefits of breastfeeding For Your  Baby  Your first milk (colostrum) helps your baby's digestive system function better.  There are antibodies in your milk that help your baby fight off infections.  Your baby has a lower incidence of asthma, allergies, and sudden infant death syndrome.  The nutrients in breast milk are better for your baby than infant formulas and are designed uniquely for your baby's needs.  Breast milk improves your baby's brain development.  Your baby is less likely to develop other conditions, such as childhood obesity, asthma, or type 2 diabetes mellitus.  For You  Breastfeeding helps to create a very special bond between you and your baby.  Breastfeeding is convenient. Breast milk is always available at   the correct temperature and costs nothing.  Breastfeeding helps to burn calories and helps you lose the weight gained during pregnancy.  Breastfeeding makes your uterus contract to its prepregnancy size faster and slows bleeding (lochia) after you give birth.  Breastfeeding helps to lower your risk of developing type 2 diabetes mellitus, osteoporosis, and breast or ovarian cancer later in life.  Signs that your baby is hungry Early Signs of Hunger  Increased alertness or activity.  Stretching.  Movement of the head from side to side.  Movement of the head and opening of the mouth when the corner of the mouth or cheek is stroked (rooting).  Increased sucking sounds, smacking lips, cooing, sighing, or squeaking.  Hand-to-mouth movements.  Increased sucking of fingers or hands.  Late Signs of Hunger  Fussing.  Intermittent crying.  Extreme Signs of Hunger Signs of extreme hunger will require calming and consoling before your baby will be able to breastfeed successfully. Do not wait for the following signs of extreme hunger to occur before you initiate breastfeeding:  Restlessness.  A loud, strong cry.  Screaming.  Breastfeeding basics Breastfeeding Initiation  Find a  comfortable place to sit or lie down, with your neck and back well supported.  Place a pillow or rolled up blanket under your baby to bring him or her to the level of your breast (if you are seated). Nursing pillows are specially designed to help support your arms and your baby while you breastfeed.  Make sure that your baby's abdomen is facing your abdomen.  Gently massage your breast. With your fingertips, massage from your chest wall toward your nipple in a circular motion. This encourages milk flow. You may need to continue this action during the feeding if your milk flows slowly.  Support your breast with 4 fingers underneath and your thumb above your nipple. Make sure your fingers are well away from your nipple and your baby's mouth.  Stroke your baby's lips gently with your finger or nipple.  When your baby's mouth is open wide enough, quickly bring your baby to your breast, placing your entire nipple and as much of the colored area around your nipple (areola) as possible into your baby's mouth. ? More areola should be visible above your baby's upper lip than below the lower lip. ? Your baby's tongue should be between his or her lower gum and your breast.  Ensure that your baby's mouth is correctly positioned around your nipple (latched). Your baby's lips should create a seal on your breast and be turned out (everted).  It is common for your baby to suck about 2-3 minutes in order to start the flow of breast milk.  Latching Teaching your baby how to latch on to your breast properly is very important. An improper latch can cause nipple pain and decreased milk supply for you and poor weight gain in your baby. Also, if your baby is not latched onto your nipple properly, he or she may swallow some air during feeding. This can make your baby fussy. Burping your baby when you switch breasts during the feeding can help to get rid of the air. However, teaching your baby to latch on properly is  still the best way to prevent fussiness from swallowing air while breastfeeding. Signs that your baby has successfully latched on to your nipple:  Silent tugging or silent sucking, without causing you pain.  Swallowing heard between every 3-4 sucks.  Muscle movement above and in front of his or her   ears while sucking.  Signs that your baby has not successfully latched on to nipple:  Sucking sounds or smacking sounds from your baby while breastfeeding.  Nipple pain.  If you think your baby has not latched on correctly, slip your finger into the corner of your baby's mouth to break the suction and place it between your baby's gums. Attempt breastfeeding initiation again. Signs of Successful Breastfeeding Signs from your baby:  A gradual decrease in the number of sucks or complete cessation of sucking.  Falling asleep.  Relaxation of his or her body.  Retention of a small amount of milk in his or her mouth.  Letting go of your breast by himself or herself.  Signs from you:  Breasts that have increased in firmness, weight, and size 1-3 hours after feeding.  Breasts that are softer immediately after breastfeeding.  Increased milk volume, as well as a change in milk consistency and color by the fifth day of breastfeeding.  Nipples that are not sore, cracked, or bleeding.  Signs That Your Baby is Getting Enough Milk  Wetting at least 1-2 diapers during the first 24 hours after birth.  Wetting at least 5-6 diapers every 24 hours for the first week after birth. The urine should be clear or pale yellow by 5 days after birth.  Wetting 6-8 diapers every 24 hours as your baby continues to grow and develop.  At least 3 stools in a 24-hour period by age 5 days. The stool should be soft and yellow.  At least 3 stools in a 24-hour period by age 7 days. The stool should be seedy and yellow.  No loss of weight greater than 10% of birth weight during the first 3 days of age.  Average  weight gain of 4-7 ounces (113-198 g) per week after age 4 days.  Consistent daily weight gain by age 5 days, without weight loss after the age of 2 weeks.  After a feeding, your baby may spit up a small amount. This is common. Breastfeeding frequency and duration Frequent feeding will help you make more milk and can prevent sore nipples and breast engorgement. Breastfeed when you feel the need to reduce the fullness of your breasts or when your baby shows signs of hunger. This is called "breastfeeding on demand." Avoid introducing a pacifier to your baby while you are working to establish breastfeeding (the first 4-6 weeks after your baby is born). After this time you may choose to use a pacifier. Research has shown that pacifier use during the first year of a baby's life decreases the risk of sudden infant death syndrome (SIDS). Allow your baby to feed on each breast as long as he or she wants. Breastfeed until your baby is finished feeding. When your baby unlatches or falls asleep while feeding from the first breast, offer the second breast. Because newborns are often sleepy in the first few weeks of life, you may need to awaken your baby to get him or her to feed. Breastfeeding times will vary from baby to baby. However, the following rules can serve as a guide to help you ensure that your baby is properly fed:  Newborns (babies 4 weeks of age or younger) may breastfeed every 1-3 hours.  Newborns should not go longer than 3 hours during the day or 5 hours during the night without breastfeeding.  You should breastfeed your baby a minimum of 8 times in a 24-hour period until you begin to introduce solid foods to your   baby at around 6 months of age.  Breast milk pumping Pumping and storing breast milk allows you to ensure that your baby is exclusively fed your breast milk, even at times when you are unable to breastfeed. This is especially important if you are going back to work while you are still  breastfeeding or when you are not able to be present during feedings. Your lactation consultant can give you guidelines on how long it is safe to store breast milk. A breast pump is a machine that allows you to pump milk from your breast into a sterile bottle. The pumped breast milk can then be stored in a refrigerator or freezer. Some breast pumps are operated by hand, while others use electricity. Ask your lactation consultant which type will work best for you. Breast pumps can be purchased, but some hospitals and breastfeeding support groups lease breast pumps on a monthly basis. A lactation consultant can teach you how to hand express breast milk, if you prefer not to use a pump. Caring for your breasts while you breastfeed Nipples can become dry, cracked, and sore while breastfeeding. The following recommendations can help keep your breasts moisturized and healthy:  Avoid using soap on your nipples.  Wear a supportive bra. Although not required, special nursing bras and tank tops are designed to allow access to your breasts for breastfeeding without taking off your entire bra or top. Avoid wearing underwire-style bras or extremely tight bras.  Air dry your nipples for 3-4minutes after each feeding.  Use only cotton bra pads to absorb leaked breast milk. Leaking of breast milk between feedings is normal.  Use lanolin on your nipples after breastfeeding. Lanolin helps to maintain your skin's normal moisture barrier. If you use pure lanolin, you do not need to wash it off before feeding your baby again. Pure lanolin is not toxic to your baby. You may also hand express a few drops of breast milk and gently massage that milk into your nipples and allow the milk to air dry.  In the first few weeks after giving birth, some women experience extremely full breasts (engorgement). Engorgement can make your breasts feel heavy, warm, and tender to the touch. Engorgement peaks within 3-5 days after you give  birth. The following recommendations can help ease engorgement:  Completely empty your breasts while breastfeeding or pumping. You may want to start by applying warm, moist heat (in the shower or with warm water-soaked hand towels) just before feeding or pumping. This increases circulation and helps the milk flow. If your baby does not completely empty your breasts while breastfeeding, pump any extra milk after he or she is finished.  Wear a snug bra (nursing or regular) or tank top for 1-2 days to signal your body to slightly decrease milk production.  Apply ice packs to your breasts, unless this is too uncomfortable for you.  Make sure that your baby is latched on and positioned properly while breastfeeding.  If engorgement persists after 48 hours of following these recommendations, contact your health care provider or a lactation consultant. Overall health care recommendations while breastfeeding  Eat healthy foods. Alternate between meals and snacks, eating 3 of each per day. Because what you eat affects your breast milk, some of the foods may make your baby more irritable than usual. Avoid eating these foods if you are sure that they are negatively affecting your baby.  Drink milk, fruit juice, and water to satisfy your thirst (about 10 glasses a day).    Rest often, relax, and continue to take your prenatal vitamins to prevent fatigue, stress, and anemia.  Continue breast self-awareness checks.  Avoid chewing and smoking tobacco. Chemicals from cigarettes that pass into breast milk and exposure to secondhand smoke may harm your baby.  Avoid alcohol and drug use, including marijuana. Some medicines that may be harmful to your baby can pass through breast milk. It is important to ask your health care provider before taking any medicine, including all over-the-counter and prescription medicine as well as vitamin and herbal supplements. It is possible to become pregnant while breastfeeding.  If birth control is desired, ask your health care provider about options that will be safe for your baby. Contact a health care provider if:  You feel like you want to stop breastfeeding or have become frustrated with breastfeeding.  You have painful breasts or nipples.  Your nipples are cracked or bleeding.  Your breasts are red, tender, or warm.  You have a swollen area on either breast.  You have a fever or chills.  You have nausea or vomiting.  You have drainage other than breast milk from your nipples.  Your breasts do not become full before feedings by the fifth day after you give birth.  You feel sad and depressed.  Your baby is too sleepy to eat well.  Your baby is having trouble sleeping.  Your baby is wetting less than 3 diapers in a 24-hour period.  Your baby has less than 3 stools in a 24-hour period.  Your baby's skin or the white part of his or her eyes becomes yellow.  Your baby is not gaining weight by 5 days of age. Get help right away if:  Your baby is overly tired (lethargic) and does not want to wake up and feed.  Your baby develops an unexplained fever. This information is not intended to replace advice given to you by your health care provider. Make sure you discuss any questions you have with your health care provider. Document Released: 06/06/2005 Document Revised: 11/18/2015 Document Reviewed: 11/28/2012 [employer] Interactive Patient Education  2017 [employer]  

## 2017-03-05 LAB — AFP TETRA
DIA VALUE (EIA): 182.84 pg/mL
DSR (BY AGE) 1 IN: 1060
Gestational Age: 22 WEEKS
MATERNAL AGE AT EDD: 24.3 a
MSAFP Mom: 1.19
MSAFP: 96.9 ng/mL
MSHCG: 73566 m[IU]/mL
Osb Risk: 10000
T18 (By Age): 1:4128 {titer}
UE3 VALUE: 2.08 ng/mL
Weight: 148 [lb_av]

## 2017-03-14 ENCOUNTER — Encounter: Payer: 59 | Admitting: Family Medicine

## 2017-03-14 ENCOUNTER — Ambulatory Visit (HOSPITAL_COMMUNITY): Payer: 59

## 2017-03-16 ENCOUNTER — Other Ambulatory Visit (HOSPITAL_COMMUNITY): Payer: Self-pay | Admitting: *Deleted

## 2017-03-16 ENCOUNTER — Ambulatory Visit (INDEPENDENT_AMBULATORY_CARE_PROVIDER_SITE_OTHER): Payer: 59 | Admitting: Obstetrics and Gynecology

## 2017-03-16 ENCOUNTER — Encounter (HOSPITAL_COMMUNITY): Payer: Self-pay

## 2017-03-16 ENCOUNTER — Ambulatory Visit (HOSPITAL_COMMUNITY)
Admission: RE | Admit: 2017-03-16 | Discharge: 2017-03-16 | Disposition: A | Discharge status: Home / Self Care | Payer: 59 | Source: Ambulatory Visit | Attending: Obstetrics and Gynecology | Admitting: Obstetrics and Gynecology

## 2017-03-16 ENCOUNTER — Other Ambulatory Visit (HOSPITAL_COMMUNITY): Payer: Self-pay | Admitting: Obstetrics and Gynecology

## 2017-03-16 VITALS — BP 122/69 | HR 79 | Wt 149.0 lb

## 2017-03-16 DIAGNOSIS — Z3A24 24 weeks gestation of pregnancy: Secondary | ICD-10-CM

## 2017-03-16 DIAGNOSIS — O24112 Pre-existing diabetes mellitus, type 2, in pregnancy, second trimester: Secondary | ICD-10-CM

## 2017-03-16 DIAGNOSIS — Z362 Encounter for other antenatal screening follow-up: Secondary | ICD-10-CM | POA: Diagnosis not present

## 2017-03-16 DIAGNOSIS — O0990 Supervision of high risk pregnancy, unspecified, unspecified trimester: Secondary | ICD-10-CM

## 2017-03-16 DIAGNOSIS — O0992 Supervision of high risk pregnancy, unspecified, second trimester: Secondary | ICD-10-CM

## 2017-03-16 DIAGNOSIS — G930 Cerebral cysts: Secondary | ICD-10-CM

## 2017-03-16 DIAGNOSIS — O24119 Pre-existing diabetes mellitus, type 2, in pregnancy, unspecified trimester: Secondary | ICD-10-CM

## 2017-03-17 ENCOUNTER — Encounter: Payer: 59 | Admitting: Obstetrics & Gynecology

## 2017-04-13 ENCOUNTER — Ambulatory Visit (INDEPENDENT_AMBULATORY_CARE_PROVIDER_SITE_OTHER): Payer: 59 | Admitting: Obstetrics and Gynecology

## 2017-04-13 ENCOUNTER — Other Ambulatory Visit (HOSPITAL_COMMUNITY): Payer: Self-pay | Admitting: *Deleted

## 2017-04-13 ENCOUNTER — Ambulatory Visit (HOSPITAL_COMMUNITY)
Admission: RE | Admit: 2017-04-13 | Discharge: 2017-04-13 | Disposition: A | Discharge status: Home / Self Care | Payer: 59 | Source: Ambulatory Visit | Attending: Family Medicine | Admitting: Family Medicine

## 2017-04-13 ENCOUNTER — Encounter (HOSPITAL_COMMUNITY): Payer: Self-pay

## 2017-04-13 VITALS — BP 131/74 | HR 95 | Wt 152.6 lb

## 2017-04-13 DIAGNOSIS — O0993 Supervision of high risk pregnancy, unspecified, third trimester: Secondary | ICD-10-CM

## 2017-04-13 DIAGNOSIS — Z23 Encounter for immunization: Secondary | ICD-10-CM

## 2017-04-13 DIAGNOSIS — Z3A28 28 weeks gestation of pregnancy: Secondary | ICD-10-CM | POA: Diagnosis not present

## 2017-04-13 DIAGNOSIS — O24119 Pre-existing diabetes mellitus, type 2, in pregnancy, unspecified trimester: Secondary | ICD-10-CM

## 2017-04-13 DIAGNOSIS — O24113 Pre-existing diabetes mellitus, type 2, in pregnancy, third trimester: Secondary | ICD-10-CM | POA: Diagnosis present

## 2017-04-13 DIAGNOSIS — O0990 Supervision of high risk pregnancy, unspecified, unspecified trimester: Secondary | ICD-10-CM

## 2017-04-13 MED ORDER — TETANUS-DIPHTH-ACELL PERTUSSIS 5-2.5-18.5 LF-MCG/0.5 IM SUSP
0.5000 mL | Freq: Once | INTRAMUSCULAR | Status: AC
  Administered 2017-04-13: 0.5 mL via INTRAMUSCULAR

## 2017-04-13 NOTE — Patient Instructions (Signed)
Contraception Choices Contraception (birth control) is the use of any methods or devices to prevent pregnancy. Below are some methods to help avoid pregnancy. Hormonal methods  Contraceptive implant. This is a thin, plastic tube containing progesterone hormone. It does not contain estrogen hormone. Your health care provider inserts the tube in the inner part of the upper arm. The tube can remain in place for up to 3 years. After 3 years, the implant must be removed. The implant prevents the ovaries from releasing an egg (ovulation), thickens the cervical mucus to prevent sperm from entering the uterus, and thins the lining of the inside of the uterus.  Progesterone-only injections. These injections are given every 3 months by your health care provider to prevent pregnancy. This synthetic progesterone hormone stops the ovaries from releasing eggs. It also thickens cervical mucus and changes the uterine lining. This makes it harder for sperm to survive in the uterus.  Birth control pills. These pills contain estrogen and progesterone hormone. They work by preventing the ovaries from releasing eggs (ovulation). They also cause the cervical mucus to thicken, preventing the sperm from entering the uterus. Birth control pills are prescribed by a health care provider.Birth control pills can also be used to treat heavy periods.  Minipill. This type of birth control pill contains only the progesterone hormone. They are taken every day of each month and must be prescribed by your health care provider.  Birth control patch. The patch contains hormones similar to those in birth control pills. It must be changed once a week and is prescribed by a health care provider.  Vaginal ring. The ring contains hormones similar to those in birth control pills. It is left in the vagina for 3 weeks, removed for 1 week, and then a new one is put back in place. The patient must be comfortable inserting and removing the ring from  the vagina.A health care provider's prescription is necessary.  Emergency contraception. Emergency contraceptives prevent pregnancy after unprotected sexual intercourse. This pill can be taken right after sex or up to 5 days after unprotected sex. It is most effective the sooner you take the pills after having sexual intercourse. Most emergency contraceptive pills are available without a prescription. Check with your pharmacist. Do not use emergency contraception as your only form of birth control. Barrier methods  Female condom. This is a thin sheath (latex or rubber) that is worn over the penis during sexual intercourse. It can be used with spermicide to increase effectiveness.  Female condom. This is a soft, loose-fitting sheath that is put into the vagina before sexual intercourse.  Diaphragm. This is a soft, latex, dome-shaped barrier that must be fitted by a health care provider. It is inserted into the vagina, along with a spermicidal jelly. It is inserted before intercourse. The diaphragm should be left in the vagina for 6 to 8 hours after intercourse.  Cervical cap. This is a round, soft, latex or plastic cup that fits over the cervix and must be fitted by a health care provider. The cap can be left in place for up to 48 hours after intercourse.  Sponge. This is a soft, circular piece of polyurethane foam. The sponge has spermicide in it. It is inserted into the vagina after wetting it and before sexual intercourse.  Spermicides. These are chemicals that kill or block sperm from entering the cervix and uterus. They come in the form of creams, jellies, suppositories, foam, or tablets. They do not require a prescription. They   are inserted into the vagina with an applicator before having sexual intercourse. The process must be repeated every time you have sexual intercourse. Intrauterine contraception  Intrauterine device (IUD). This is a T-shaped device that is put in a woman's uterus during  a menstrual period to prevent pregnancy. There are 2 types: ? Copper IUD. This type of IUD is wrapped in copper wire and is placed inside the uterus. Copper makes the uterus and fallopian tubes produce a fluid that kills sperm. It can stay in place for 10 years. ? Hormone IUD. This type of IUD contains the hormone progestin (synthetic progesterone). The hormone thickens the cervical mucus and prevents sperm from entering the uterus, and it also thins the uterine lining to prevent implantation of a fertilized egg. The hormone can weaken or kill the sperm that get into the uterus. It can stay in place for 3-5 years, depending on which type of IUD is used. Permanent methods of contraception  Female tubal ligation. This is when the woman's fallopian tubes are surgically sealed, tied, or blocked to prevent the egg from traveling to the uterus.  Hysteroscopic sterilization. This involves placing a small coil or insert into each fallopian tube. Your doctor uses a technique called hysteroscopy to do the procedure. The device causes scar tissue to form. This results in permanent blockage of the fallopian tubes, so the sperm cannot fertilize the egg. It takes about 3 months after the procedure for the tubes to become blocked. You must use another form of birth control for these 3 months.  Female sterilization. This is when the female has the tubes that carry sperm tied off (vasectomy).This blocks sperm from entering the vagina during sexual intercourse. After the procedure, the man can still ejaculate fluid (semen). Natural planning methods  Natural family planning. This is not having sexual intercourse or using a barrier method (condom, diaphragm, cervical cap) on days the woman could become pregnant.  Calendar method. This is keeping track of the length of each menstrual cycle and identifying when you are fertile.  Ovulation method. This is avoiding sexual intercourse during ovulation.  Symptothermal method.  This is avoiding sexual intercourse during ovulation, using a thermometer and ovulation symptoms.  Post-ovulation method. This is timing sexual intercourse after you have ovulated. Regardless of which type or method of contraception you choose, it is important that you use condoms to protect against the transmission of sexually transmitted infections (STIs). Talk with your health care provider about which form of contraception is most appropriate for you. This information is not intended to replace advice given to you by your health care provider. Make sure you discuss any questions you have with your health care provider. Document Released: 06/06/2005 Document Revised: 11/12/2015 Document Reviewed: 11/29/2012 [employer] Interactive Patient Education  2017 [employer]  

## 2017-04-13 NOTE — Progress Notes (Signed)
   PRENATAL VISIT NOTE  Subjective:  Cheyenne Lozano is a 26 y.o. G2P0010 at 7072w0d being seen today for ongoing prenatal care.  She is currently monitored for the following issues for this high-risk pregnancy and has Type 2 diabetes mellitus affecting pregnancy, antepartum and Supervision of high risk pregnancy, antepartum on her problem list.  Patient reports no complaints.  Contractions: Irritability. Vag. Bleeding: None.  Movement: Present. Denies leaking of fluid.   Diet controlled FG: 80s BG: 110s  The following portions of the patient's history were reviewed and updated as appropriate: allergies, current medications, past family history, past medical history, past social history, past surgical history and problem list. Problem list updated.  Objective:   Vitals:   04/13/17 0932  BP: 131/74  Pulse: 95  Weight: 152 lb 9.6 oz (69.2 kg)    Fetal Status: Fetal Heart Rate (bpm): 135   Movement: Present     General:  Alert, oriented and cooperative. Patient is in no acute distress.  Skin: Skin is warm and dry. No rash noted.   Cardiovascular: Normal heart rate noted  Respiratory: Normal respiratory effort, no problems with respiration noted  Abdomen: Soft, gravid, appropriate for gestational age.  Pain/Pressure: Present     Pelvic: Cervical exam deferred        Extremities: Normal range of motion.  Edema: Trace  Mental Status:  Normal mood and affect. Normal behavior. Normal judgment and thought content.   Assessment and Plan:  Pregnancy: G2P0010 at 2072w0d  1. Type 2 diabetes mellitus affecting pregnancy, antepartum Cont diet control MFM US scheduled f/u today  2. Supervision of high risk pregnancy, antepartum - RPR - CBC - HIV antibody (with reflex)  Preterm labor symptoms and general obstetric precautions including but not limited to vaginal bleeding, contractions, leaking of fluid and fetal movement were reviewed in detail with the patient. Please refer to After Visit  Summary for other counseling recommendations.  Return in about 2 weeks (around 04/27/2017).   Conan BowensKelly M Davis, MD

## 2017-04-14 LAB — CBC
HEMATOCRIT: 32 % — AB (ref 34.0–46.6)
HEMOGLOBIN: 10 g/dL — AB (ref 11.1–15.9)
MCH: 26.4 pg — ABNORMAL LOW (ref 26.6–33.0)
MCHC: 31.3 g/dL — ABNORMAL LOW (ref 31.5–35.7)
MCV: 84 fL (ref 79–97)
Platelets: 270 10*3/uL (ref 150–379)
RBC: 3.79 x10E6/uL (ref 3.77–5.28)
RDW: 14.7 % (ref 12.3–15.4)
WBC: 10.8 10*3/uL (ref 3.4–10.8)

## 2017-04-14 LAB — RPR: RPR Ser Ql: NONREACTIVE

## 2017-04-14 LAB — HIV ANTIBODY (ROUTINE TESTING W REFLEX): HIV Screen 4th Generation wRfx: NONREACTIVE

## 2017-04-15 ENCOUNTER — Encounter: Payer: Self-pay | Admitting: Obstetrics and Gynecology

## 2017-04-15 DIAGNOSIS — O99019 Anemia complicating pregnancy, unspecified trimester: Secondary | ICD-10-CM | POA: Insufficient documentation

## 2017-04-27 ENCOUNTER — Ambulatory Visit (INDEPENDENT_AMBULATORY_CARE_PROVIDER_SITE_OTHER): Payer: 59 | Admitting: Family Medicine

## 2017-04-27 VITALS — BP 135/73 | HR 75 | Wt 151.9 lb

## 2017-04-27 DIAGNOSIS — K5901 Slow transit constipation: Secondary | ICD-10-CM

## 2017-04-27 DIAGNOSIS — O24119 Pre-existing diabetes mellitus, type 2, in pregnancy, unspecified trimester: Secondary | ICD-10-CM

## 2017-04-27 DIAGNOSIS — D649 Anemia, unspecified: Secondary | ICD-10-CM

## 2017-04-27 DIAGNOSIS — O99019 Anemia complicating pregnancy, unspecified trimester: Secondary | ICD-10-CM

## 2017-04-27 DIAGNOSIS — O0993 Supervision of high risk pregnancy, unspecified, third trimester: Secondary | ICD-10-CM

## 2017-04-27 DIAGNOSIS — O24113 Pre-existing diabetes mellitus, type 2, in pregnancy, third trimester: Secondary | ICD-10-CM

## 2017-04-27 DIAGNOSIS — O99013 Anemia complicating pregnancy, third trimester: Secondary | ICD-10-CM

## 2017-04-27 DIAGNOSIS — O0990 Supervision of high risk pregnancy, unspecified, unspecified trimester: Secondary | ICD-10-CM

## 2017-04-27 LAB — POCT URINALYSIS DIP (DEVICE)
BILIRUBIN URINE: NEGATIVE
GLUCOSE, UA: NEGATIVE mg/dL
Ketones, ur: NEGATIVE mg/dL
NITRITE: NEGATIVE
Protein, ur: NEGATIVE mg/dL
Specific Gravity, Urine: 1.015 (ref 1.005–1.030)
UROBILINOGEN UA: 0.2 mg/dL (ref 0.0–1.0)
pH: 7 (ref 5.0–8.0)

## 2017-04-27 MED ORDER — DOCUSATE SODIUM 100 MG PO CAPS: 100.0000 mg | ORAL_CAPSULE | Freq: Two times a day (BID) | ORAL | 2 refills | Status: DC | PRN

## 2017-04-27 MED ORDER — CHROMAGEN PO CAPS: 1.0000 | ORAL_CAPSULE | Freq: Two times a day (BID) | ORAL | 11 refills | Status: DC

## 2017-04-27 NOTE — Patient Instructions (Signed)
Contraception Choices Contraception (birth control) is the use of any methods or devices to prevent pregnancy. Below are some methods to help avoid pregnancy. Hormonal methods  Contraceptive implant. This is a thin, plastic tube containing progesterone hormone. It does not contain estrogen hormone. Your health care provider inserts the tube in the inner part of the upper arm. The tube can remain in place for up to 3 years. After 3 years, the implant must be removed. The implant prevents the ovaries from releasing an egg (ovulation), thickens the cervical mucus to prevent sperm from entering the uterus, and thins the lining of the inside of the uterus.  Progesterone-only injections. These injections are given every 3 months by your health care provider to prevent pregnancy. This synthetic progesterone hormone stops the ovaries from releasing eggs. It also thickens cervical mucus and changes the uterine lining. This makes it harder for sperm to survive in the uterus.  Birth control pills. These pills contain estrogen and progesterone hormone. They work by preventing the ovaries from releasing eggs (ovulation). They also cause the cervical mucus to thicken, preventing the sperm from entering the uterus. Birth control pills are prescribed by a health care provider.Birth control pills can also be used to treat heavy periods.  Minipill. This type of birth control pill contains only the progesterone hormone. They are taken every day of each month and must be prescribed by your health care provider.  Birth control patch. The patch contains hormones similar to those in birth control pills. It must be changed once a week and is prescribed by a health care provider.  Vaginal ring. The ring contains hormones similar to those in birth control pills. It is left in the vagina for 3 weeks, removed for 1 week, and then a new one is put back in place. The patient must be comfortable inserting and removing the ring  from the vagina.A health care provider's prescription is necessary.  Emergency contraception. Emergency contraceptives prevent pregnancy after unprotected sexual intercourse. This pill can be taken right after sex or up to 5 days after unprotected sex. It is most effective the sooner you take the pills after having sexual intercourse. Most emergency contraceptive pills are available without a prescription. Check with your pharmacist. Do not use emergency contraception as your only form of birth control. Barrier methods  Female condom. This is a thin sheath (latex or rubber) that is worn over the penis during sexual intercourse. It can be used with spermicide to increase effectiveness.  Female condom. This is a soft, loose-fitting sheath that is put into the vagina before sexual intercourse.  Diaphragm. This is a soft, latex, dome-shaped barrier that must be fitted by a health care provider. It is inserted into the vagina, along with a spermicidal jelly. It is inserted before intercourse. The diaphragm should be left in the vagina for 6 to 8 hours after intercourse.  Cervical cap. This is a round, soft, latex or plastic cup that fits over the cervix and must be fitted by a health care provider. The cap can be left in place for up to 48 hours after intercourse.  Sponge. This is a soft, circular piece of polyurethane foam. The sponge has spermicide in it. It is inserted into the vagina after wetting it and before sexual intercourse.  Spermicides. These are chemicals that kill or block sperm from entering the cervix and uterus. They come in the form of creams, jellies, suppositories, foam, or tablets. They do not require a prescription. They   are inserted into the vagina with an applicator before having sexual intercourse. The process must be repeated every time you have sexual intercourse. Intrauterine contraception  Intrauterine device (IUD). This is a T-shaped device that is put in a woman's uterus  during a menstrual period to prevent pregnancy. There are 2 types: ? Copper IUD. This type of IUD is wrapped in copper wire and is placed inside the uterus. Copper makes the uterus and fallopian tubes produce a fluid that kills sperm. It can stay in place for 10 years. ? Hormone IUD. This type of IUD contains the hormone progestin (synthetic progesterone). The hormone thickens the cervical mucus and prevents sperm from entering the uterus, and it also thins the uterine lining to prevent implantation of a fertilized egg. The hormone can weaken or kill the sperm that get into the uterus. It can stay in place for 3-5 years, depending on which type of IUD is used. Permanent methods of contraception  Female tubal ligation. This is when the woman's fallopian tubes are surgically sealed, tied, or blocked to prevent the egg from traveling to the uterus.  Hysteroscopic sterilization. This involves placing a small coil or insert into each fallopian tube. Your doctor uses a technique called hysteroscopy to do the procedure. The device causes scar tissue to form. This results in permanent blockage of the fallopian tubes, so the sperm cannot fertilize the egg. It takes about 3 months after the procedure for the tubes to become blocked. You must use another form of birth control for these 3 months.  Female sterilization. This is when the female has the tubes that carry sperm tied off (vasectomy).This blocks sperm from entering the vagina during sexual intercourse. After the procedure, the man can still ejaculate fluid (semen). Natural planning methods  Natural family planning. This is not having sexual intercourse or using a barrier method (condom, diaphragm, cervical cap) on days the woman could become pregnant.  Calendar method. This is keeping track of the length of each menstrual cycle and identifying when you are fertile.  Ovulation method. This is avoiding sexual intercourse during ovulation.  Symptothermal  method. This is avoiding sexual intercourse during ovulation, using a thermometer and ovulation symptoms.  Post-ovulation method. This is timing sexual intercourse after you have ovulated. Regardless of which type or method of contraception you choose, it is important that you use condoms to protect against the transmission of sexually transmitted infections (STIs). Talk with your health care provider about which form of contraception is most appropriate for you. This information is not intended to replace advice given to you by your health care provider. Make sure you discuss any questions you have with your health care provider. Document Released: 06/06/2005 Document Revised: 11/12/2015 Document Reviewed: 11/29/2012 Elsevier Interactive Patient Education  2017 [employer]   Breastfeeding Deciding to breastfeed is one of the best choices you can make for you and your baby. A change in hormones during pregnancy causes your breast tissue to grow and increases the number and size of your milk ducts. These hormones also allow proteins, sugars, and fats from your blood supply to make breast milk in your milk-producing glands. Hormones prevent breast milk from being released before your baby is born as well as prompt milk flow after birth. Once breastfeeding has begun, thoughts of your baby, as well as his or her sucking or crying, can stimulate the release of milk from your milk-producing glands. Benefits of breastfeeding For Your Baby  Your first milk (colostrum) helps   your baby's digestive system function better.  There are antibodies in your milk that help your baby fight off infections.  Your baby has a lower incidence of asthma, allergies, and sudden infant death syndrome.  The nutrients in breast milk are better for your baby than infant formulas and are designed uniquely for your baby's needs.  Breast milk improves your baby's brain development.  Your baby is less likely to develop  other conditions, such as childhood obesity, asthma, or type 2 diabetes mellitus.  For You  Breastfeeding helps to create a very special bond between you and your baby.  Breastfeeding is convenient. Breast milk is always available at the correct temperature and costs nothing.  Breastfeeding helps to burn calories and helps you lose the weight gained during pregnancy.  Breastfeeding makes your uterus contract to its prepregnancy size faster and slows bleeding (lochia) after you give birth.  Breastfeeding helps to lower your risk of developing type 2 diabetes mellitus, osteoporosis, and breast or ovarian cancer later in life.  Signs that your baby is hungry Early Signs of Hunger  Increased alertness or activity.  Stretching.  Movement of the head from side to side.  Movement of the head and opening of the mouth when the corner of the mouth or cheek is stroked (rooting).  Increased sucking sounds, smacking lips, cooing, sighing, or squeaking.  Hand-to-mouth movements.  Increased sucking of fingers or hands.  Late Signs of Hunger  Fussing.  Intermittent crying.  Extreme Signs of Hunger Signs of extreme hunger will require calming and consoling before your baby will be able to breastfeed successfully. Do not wait for the following signs of extreme hunger to occur before you initiate breastfeeding:  Restlessness.  A loud, strong cry.  Screaming.  Breastfeeding basics Breastfeeding Initiation  Find a comfortable place to sit or lie down, with your neck and back well supported.  Place a pillow or rolled up blanket under your baby to bring him or her to the level of your breast (if you are seated). Nursing pillows are specially designed to help support your arms and your baby while you breastfeed.  Make sure that your baby's abdomen is facing your abdomen.  Gently massage your breast. With your fingertips, massage from your chest wall toward your nipple in a circular  motion. This encourages milk flow. You may need to continue this action during the feeding if your milk flows slowly.  Support your breast with 4 fingers underneath and your thumb above your nipple. Make sure your fingers are well away from your nipple and your baby's mouth.  Stroke your baby's lips gently with your finger or nipple.  When your baby's mouth is open wide enough, quickly bring your baby to your breast, placing your entire nipple and as much of the colored area around your nipple (areola) as possible into your baby's mouth. ? More areola should be visible above your baby's upper lip than below the lower lip. ? Your baby's tongue should be between his or her lower gum and your breast.  Ensure that your baby's mouth is correctly positioned around your nipple (latched). Your baby's lips should create a seal on your breast and be turned out (everted).  It is common for your baby to suck about 2-3 minutes in order to start the flow of breast milk.  Latching Teaching your baby how to latch on to your breast properly is very important. An improper latch can cause nipple pain and decreased milk supply for   you and poor weight gain in your baby. Also, if your baby is not latched onto your nipple properly, he or she may swallow some air during feeding. This can make your baby fussy. Burping your baby when you switch breasts during the feeding can help to get rid of the air. However, teaching your baby to latch on properly is still the best way to prevent fussiness from swallowing air while breastfeeding. Signs that your baby has successfully latched on to your nipple:  Silent tugging or silent sucking, without causing you pain.  Swallowing heard between every 3-4 sucks.  Muscle movement above and in front of his or her ears while sucking.  Signs that your baby has not successfully latched on to nipple:  Sucking sounds or smacking sounds from your baby while breastfeeding.  Nipple  pain.  If you think your baby has not latched on correctly, slip your finger into the corner of your baby's mouth to break the suction and place it between your baby's gums. Attempt breastfeeding initiation again. Signs of Successful Breastfeeding Signs from your baby:  A gradual decrease in the number of sucks or complete cessation of sucking.  Falling asleep.  Relaxation of his or her body.  Retention of a small amount of milk in his or her mouth.  Letting go of your breast by himself or herself.  Signs from you:  Breasts that have increased in firmness, weight, and size 1-3 hours after feeding.  Breasts that are softer immediately after breastfeeding.  Increased milk volume, as well as a change in milk consistency and color by the fifth day of breastfeeding.  Nipples that are not sore, cracked, or bleeding.  Signs That Your Baby is Getting Enough Milk  Wetting at least 1-2 diapers during the first 24 hours after birth.  Wetting at least 5-6 diapers every 24 hours for the first week after birth. The urine should be clear or pale yellow by 5 days after birth.  Wetting 6-8 diapers every 24 hours as your baby continues to grow and develop.  At least 3 stools in a 24-hour period by age 5 days. The stool should be soft and yellow.  At least 3 stools in a 24-hour period by age 7 days. The stool should be seedy and yellow.  No loss of weight greater than 10% of birth weight during the first 3 days of age.  Average weight gain of 4-7 ounces (113-198 g) per week after age 4 days.  Consistent daily weight gain by age 5 days, without weight loss after the age of 2 weeks.  After a feeding, your baby may spit up a small amount. This is common. Breastfeeding frequency and duration Frequent feeding will help you make more milk and can prevent sore nipples and breast engorgement. Breastfeed when you feel the need to reduce the fullness of your breasts or when your baby shows signs of  hunger. This is called "breastfeeding on demand." Avoid introducing a pacifier to your baby while you are working to establish breastfeeding (the first 4-6 weeks after your baby is born). After this time you may choose to use a pacifier. Research has shown that pacifier use during the first year of a baby's life decreases the risk of sudden infant death syndrome (SIDS). Allow your baby to feed on each breast as long as he or she wants. Breastfeed until your baby is finished feeding. When your baby unlatches or falls asleep while feeding from the first breast, offer the   second breast. Because newborns are often sleepy in the first few weeks of life, you may need to awaken your baby to get him or her to feed. Breastfeeding times will vary from baby to baby. However, the following rules can serve as a guide to help you ensure that your baby is properly fed:  Newborns (babies 4 weeks of age or younger) may breastfeed every 1-3 hours.  Newborns should not go longer than 3 hours during the day or 5 hours during the night without breastfeeding.  You should breastfeed your baby a minimum of 8 times in a 24-hour period until you begin to introduce solid foods to your baby at around 6 months of age.  Breast milk pumping Pumping and storing breast milk allows you to ensure that your baby is exclusively fed your breast milk, even at times when you are unable to breastfeed. This is especially important if you are going back to work while you are still breastfeeding or when you are not able to be present during feedings. Your lactation consultant can give you guidelines on how long it is safe to store breast milk. A breast pump is a machine that allows you to pump milk from your breast into a sterile bottle. The pumped breast milk can then be stored in a refrigerator or freezer. Some breast pumps are operated by hand, while others use electricity. Ask your lactation consultant which type will work best for you. Breast  pumps can be purchased, but some hospitals and breastfeeding support groups lease breast pumps on a monthly basis. A lactation consultant can teach you how to hand express breast milk, if you prefer not to use a pump. Caring for your breasts while you breastfeed Nipples can become dry, cracked, and sore while breastfeeding. The following recommendations can help keep your breasts moisturized and healthy:  Avoid using soap on your nipples.  Wear a supportive bra. Although not required, special nursing bras and tank tops are designed to allow access to your breasts for breastfeeding without taking off your entire bra or top. Avoid wearing underwire-style bras or extremely tight bras.  Air dry your nipples for 3-4minutes after each feeding.  Use only cotton bra pads to absorb leaked breast milk. Leaking of breast milk between feedings is normal.  Use lanolin on your nipples after breastfeeding. Lanolin helps to maintain your skin's normal moisture barrier. If you use pure lanolin, you do not need to wash it off before feeding your baby again. Pure lanolin is not toxic to your baby. You may also hand express a few drops of breast milk and gently massage that milk into your nipples and allow the milk to air dry.  In the first few weeks after giving birth, some women experience extremely full breasts (engorgement). Engorgement can make your breasts feel heavy, warm, and tender to the touch. Engorgement peaks within 3-5 days after you give birth. The following recommendations can help ease engorgement:  Completely empty your breasts while breastfeeding or pumping. You may want to start by applying warm, moist heat (in the shower or with warm water-soaked hand towels) just before feeding or pumping. This increases circulation and helps the milk flow. If your baby does not completely empty your breasts while breastfeeding, pump any extra milk after he or she is finished.  Wear a snug bra (nursing or  regular) or tank top for 1-2 days to signal your body to slightly decrease milk production.  Apply ice packs to your breasts, unless   this is too uncomfortable for you.  Make sure that your baby is latched on and positioned properly while breastfeeding.  If engorgement persists after 48 hours of following these recommendations, contact your health care provider or a lactation consultant. Overall health care recommendations while breastfeeding  Eat healthy foods. Alternate between meals and snacks, eating 3 of each per day. Because what you eat affects your breast milk, some of the foods may make your baby more irritable than usual. Avoid eating these foods if you are sure that they are negatively affecting your baby.  Drink milk, fruit juice, and water to satisfy your thirst (about 10 glasses a day).  Rest often, relax, and continue to take your prenatal vitamins to prevent fatigue, stress, and anemia.  Continue breast self-awareness checks.  Avoid chewing and smoking tobacco. Chemicals from cigarettes that pass into breast milk and exposure to secondhand smoke may harm your baby.  Avoid alcohol and drug use, including marijuana. Some medicines that may be harmful to your baby can pass through breast milk. It is important to ask your health care provider before taking any medicine, including all over-the-counter and prescription medicine as well as vitamin and herbal supplements. It is possible to become pregnant while breastfeeding. If birth control is desired, ask your health care provider about options that will be safe for your baby. Contact a health care provider if:  You feel like you want to stop breastfeeding or have become frustrated with breastfeeding.  You have painful breasts or nipples.  Your nipples are cracked or bleeding.  Your breasts are red, tender, or warm.  You have a swollen area on either breast.  You have a fever or chills.  You have nausea or  vomiting.  You have drainage other than breast milk from your nipples.  Your breasts do not become full before feedings by the fifth day after you give birth.  You feel sad and depressed.  Your baby is too sleepy to eat well.  Your baby is having trouble sleeping.  Your baby is wetting less than 3 diapers in a 24-hour period.  Your baby has less than 3 stools in a 24-hour period.  Your baby's skin or the white part of his or her eyes becomes yellow.  Your baby is not gaining weight by 5 days of age. Get help right away if:  Your baby is overly tired (lethargic) and does not want to wake up and feed.  Your baby develops an unexplained fever. This information is not intended to replace advice given to you by your health care provider. Make sure you discuss any questions you have with your health care provider. Document Released: 06/06/2005 Document Revised: 11/18/2015 Document Reviewed: 11/28/2012 Elsevier Interactive Patient Education  2017 [employer]  

## 2017-04-27 NOTE — Progress Notes (Signed)
    PRENATAL VISIT NOTE  Subjective:  Cheyenne Lozano is a [age] y.o. G2P0010 at 6573w1d being seen today for ongoing prenatal care.  She is currently monitored for the following issues for this high-risk pregnancy and has Type 2 diabetes mellitus affecting pregnancy, antepartum; Supervision of high risk pregnancy, antepartum; and Anemia of pregnancy, unspecified trimester on their problem list.  Patient reports fatigue and PICA.  Contractions: Not present. Vag. Bleeding: None.  Movement: Present. Denies leaking of fluid.   The following portions of the patient's history were reviewed and updated as appropriate: allergies, current medications, past family history, past medical history, past social history, past surgical history and problem list. Problem list updated.  Objective:   Vitals:   04/27/17 1449  BP: 135/73  Pulse: 75  Weight: 151 lb 14.4 oz (68.9 kg)    Fetal Status: Fetal Heart Rate (bpm): 140 Fundal Height: 31 cm Movement: Present     General:  Alert, oriented and cooperative. Patient is in no acute distress.  Skin: Skin is warm and dry. No rash noted.   Cardiovascular: Normal heart rate noted  Respiratory: Normal respiratory effort, no problems with respiration noted  Abdomen: Soft, gravid, appropriate for gestational age.  Pain/Pressure: Present     Pelvic: Cervical exam deferred        Extremities: Normal range of motion.  Edema: Trace  Mental Status:  Normal mood and affect. Normal behavior. Normal judgment and thought content.  U/s 10/25 vtx, nml AFI EFW 2 lb 11 oz (60%) FBS 51-70 2 hour pp 80-130 Assessment and Plan:  Pregnancy: G2P0010 at 3873w1d  1. Type 2 diabetes mellitus affecting pregnancy, antepartum continue diet  2. Supervision of high risk pregnancy, antepartum   3. Anemia of pregnancy, unspecified trimester - Iron Combinations (CHROMAGEN) capsule; Take 1 capsule 2 (two) times daily by mouth.  Dispense: 60 capsule; Refill: 11  4. Slow transit  constipation Due to iron rx. - docusate sodium (COLACE) 100 MG capsule; Take 1 capsule (100 mg total) 2 (two) times daily as needed by mouth.  Dispense: 30 capsule; Refill: 2  Preterm labor symptoms and general obstetric precautions including but not limited to vaginal bleeding, contractions, leaking of fluid and fetal movement were reviewed in detail with the patient. Please refer to After Visit Summary for other counseling recommendations.  Return in 2 weeks (on 05/11/2017) for OB visit and BPP, HRC.   Reva Boresanya S Johan Creveling, MD

## 2017-05-03 ENCOUNTER — Encounter (HOSPITAL_COMMUNITY): Payer: Self-pay | Admitting: *Deleted

## 2017-05-03 ENCOUNTER — Inpatient Hospital Stay (HOSPITAL_COMMUNITY)
Admission: AD | Admit: 2017-05-03 | Discharge: 2017-05-03 | Disposition: A | Discharge status: Home / Self Care | Payer: 59 | Source: Ambulatory Visit | Attending: Obstetrics and Gynecology | Admitting: Obstetrics and Gynecology

## 2017-05-03 DIAGNOSIS — N760 Acute vaginitis: Secondary | ICD-10-CM

## 2017-05-03 DIAGNOSIS — R109 Unspecified abdominal pain: Secondary | ICD-10-CM | POA: Diagnosis not present

## 2017-05-03 DIAGNOSIS — B9689 Other specified bacterial agents as the cause of diseases classified elsewhere: Secondary | ICD-10-CM | POA: Diagnosis not present

## 2017-05-03 DIAGNOSIS — O26893 Other specified pregnancy related conditions, third trimester: Secondary | ICD-10-CM | POA: Diagnosis present

## 2017-05-03 DIAGNOSIS — O23593 Infection of other part of genital tract in pregnancy, third trimester: Secondary | ICD-10-CM | POA: Diagnosis not present

## 2017-05-03 DIAGNOSIS — Z3A30 30 weeks gestation of pregnancy: Secondary | ICD-10-CM | POA: Diagnosis not present

## 2017-05-03 DIAGNOSIS — O26899 Other specified pregnancy related conditions, unspecified trimester: Secondary | ICD-10-CM | POA: Diagnosis present

## 2017-05-03 DIAGNOSIS — Z3A31 31 weeks gestation of pregnancy: Secondary | ICD-10-CM

## 2017-05-03 DIAGNOSIS — O99019 Anemia complicating pregnancy, unspecified trimester: Secondary | ICD-10-CM

## 2017-05-03 LAB — WET PREP, GENITAL
Sperm: NONE SEEN
TRICH WET PREP: NONE SEEN
YEAST WET PREP: NONE SEEN

## 2017-05-03 LAB — URINALYSIS, ROUTINE W REFLEX MICROSCOPIC
BILIRUBIN URINE: NEGATIVE
Glucose, UA: NEGATIVE mg/dL
Hgb urine dipstick: NEGATIVE
KETONES UR: NEGATIVE mg/dL
Nitrite: NEGATIVE
Protein, ur: NEGATIVE mg/dL
SPECIFIC GRAVITY, URINE: 1.005 (ref 1.005–1.030)
pH: 7 (ref 5.0–8.0)

## 2017-05-03 MED ORDER — METRONIDAZOLE 0.75 % VA GEL: 1.0000 | Freq: Every day | VAGINAL | 0 refills | Status: AC

## 2017-05-03 NOTE — MAU Note (Signed)
Pt reports last night when she was getting out of the shower she noticed some fluid running down her leg. States this am she started having painful contractions, back pain, and pressure.

## 2017-05-03 NOTE — MAU Provider Note (Signed)
History     CSN: [id number]  Arrival date and time: 05/03/17 1124   First Provider Initiated Contact with Patient 05/03/17 1211      Chief Complaint  Patient presents with  . Contractions  . Rupture of Membranes  . Back Pain   HPI  Cheyenne Lozano is a 26 y.o. G2P0010 at 59w6dgestation presenting to MAU with complaints of "feeling clear, milky, white tinged fluid running down legs after getting out of the shower last night", BH ctxs, and lower back pain. She also complains of having the "urge to poop".   Past Medical History:  Diagnosis Date  . Breast hypertrophy in female 02/26/2015  . Type II diabetes mellitus ([place]ales    "actually I'm prediabetic; dr. put me on RX to get A1C #'s down" (02/26/2015)    Past Surgical History:  Procedure Laterality Date  . REDUCTION MAMMAPLASTY Bilateral 02/26/2015  . WISDOM TOOTH EXTRACTION  ~ 2010   "bottom 2"    Family History  Problem Relation Age of Onset  . Diabetes Father   . Hypertension Father   . Diabetes Maternal Grandmother   . Hypertension Maternal Grandmother   . Diabetes Maternal Grandfather   . Hypertension Maternal Grandfather     Social History   Tobacco Use  . Smoking status: Never Smoker  . Smokeless tobacco: Never Used  Substance Use Topics  . Alcohol use: Yes    Comment: not since pregnancy  . Drug use: No    Allergies:  Allergies  Allergen Reactions  . Coconut Oil Hives and Itching    Medications Prior to Admission  Medication Sig Dispense Refill Last Dose  . ACCU-CHEK FASTCLIX LANCETS MISC 1 Device by Percutaneous route 4 (four) times daily. 100 each 12 Taking  . Blood Glucose Monitoring Suppl (ACCU-CHEK NANO SMARTVIEW) w/Device KIT 1 Device by Does not apply route daily. 1 kit 0 Taking  . docusate sodium (COLACE) 100 MG capsule Take 1 capsule (100 mg total) 2 (two) times daily as needed by mouth. 30 capsule 2   . glucose blood test strip Use as instructed 100 each 12 Taking  . Iron Combinations  (CHROMAGEN) capsule Take 1 capsule 2 (two) times daily by mouth. 60 capsule 11   . ondansetron (ZOFRAN ODT) 4 MG disintegrating tablet Take 1 tablet (4 mg total) by mouth every 6 (six) hours as needed for nausea. 20 tablet 2 Taking  . Prenatal Vit-Fe Fumarate-FA (PRENATAL MULTIVITAMIN) TABS tablet Take 1 tablet by mouth daily at 12 noon.   Taking  . sucralfate (CARAFATE) 1 GM/10ML suspension Take 10 mLs (1 g total) by mouth 4 (four) times daily -  with meals and at bedtime. (Patient not taking: Reported on 04/13/2017) 420 mL 0 Not Taking    Review of Systems  Constitutional: Negative.   HENT: Negative.   Eyes: Negative.   Respiratory: Negative.   Cardiovascular: Negative.   Gastrointestinal: Positive for abdominal pain.  Endocrine: Negative.   Genitourinary: Positive for pelvic pain and vaginal discharge.  Musculoskeletal: Negative.   Skin: Negative.   Allergic/Immunologic: Negative.   Neurological: Negative.   Hematological: Negative.   Psychiatric/Behavioral: Negative.    Physical Exam   Blood pressure 126/71, pulse 79, temperature 98.5 F (36.9 C), temperature source Oral, resp. rate 16, height _0  (1.499 m), weight 154 lb (69.9 kg), last menstrual period 09/29/2016, SpO2 100 %.  Physical Exam  Nursing note and vitals reviewed. Constitutional: She is oriented to person, place, and time. She  appears well-developed and well-nourished.  HENT:  Head: Normocephalic.  Eyes: Pupils are equal, round, and reactive to light.  Neck: Normal range of motion.  Cardiovascular: Normal rate, regular rhythm and normal heart sounds.  Respiratory: Effort normal and breath sounds normal.  GI: Soft. Bowel sounds are normal.  Genitourinary:  Genitourinary Comments: Uterus: gravid, S=D, cx: smooth, pink, no lesions, small amt of thick, chunky, white vaginal d/c, closed/long/firm, no CMT or friability, no adnexal tenderness   Musculoskeletal: Normal range of motion.  Neurological: She is alert  and oriented to person, place, and time.  Skin: Skin is warm and dry.  Psychiatric: She has a normal mood and affect. Her behavior is normal. Judgment and thought content normal.    MAU Course  Procedures  MDM Wet Prep GC/CT [place] Slide -- negative  Results for orders placed or performed during the hospital encounter of 05/03/17 (from the past 24 hour(s))  Wet prep, genital     Status: Abnormal   Collection Time: 05/03/17 12:17 PM  Result Value Ref Range   Yeast Wet Prep HPF POC NONE SEEN NONE SEEN   Trich, Wet Prep NONE SEEN NONE SEEN   Clue Cells Wet Prep HPF POC PRESENT (A) NONE SEEN   WBC, Wet Prep HPF POC FEW (A) NONE SEEN   Sperm NONE SEEN     Assessment and Plan  Bacterial vaginitis - Rx for Metrogel 0.75% per vagina hs x 5 days - Information provided on BV & Metrogel   Abdominal cramping affecting pregnancy - Reassurance that BV can cause the type of abd cramping she's experiencing - May take Tylenol 1000 mg po every 6 hrs  Discharge home Patient verbalized an understanding of the plan of care and agrees.    Laury Deep, MSN, CNM 05/03/2017, 12:11 PM

## 2017-05-03 NOTE — MAU Note (Signed)
Pt presents with c/o leaking fluid last night after getting out the shower.  Reports fluid was clear, with "milky white" tinge.  States this morning began having Braxton Hicks ctxs accompanied with lower back pain.  Pt also reports having "urge to poop".  Denies VB.  Reports +FM.

## 2017-05-08 ENCOUNTER — Encounter: Payer: Self-pay | Admitting: Obstetrics and Gynecology

## 2017-05-08 ENCOUNTER — Ambulatory Visit: Payer: Self-pay

## 2017-05-08 ENCOUNTER — Ambulatory Visit: Payer: 59 | Admitting: Obstetrics and Gynecology

## 2017-05-08 ENCOUNTER — Ambulatory Visit (INDEPENDENT_AMBULATORY_CARE_PROVIDER_SITE_OTHER): Payer: 59 | Admitting: *Deleted

## 2017-05-08 VITALS — BP 136/77 | HR 101 | Wt 149.0 lb

## 2017-05-08 DIAGNOSIS — O0990 Supervision of high risk pregnancy, unspecified, unspecified trimester: Secondary | ICD-10-CM

## 2017-05-08 DIAGNOSIS — O24119 Pre-existing diabetes mellitus, type 2, in pregnancy, unspecified trimester: Secondary | ICD-10-CM

## 2017-05-08 DIAGNOSIS — O0993 Supervision of high risk pregnancy, unspecified, third trimester: Secondary | ICD-10-CM

## 2017-05-08 DIAGNOSIS — Z029 Encounter for administrative examinations, unspecified: Secondary | ICD-10-CM

## 2017-05-08 DIAGNOSIS — O24113 Pre-existing diabetes mellitus, type 2, in pregnancy, third trimester: Secondary | ICD-10-CM

## 2017-05-08 DIAGNOSIS — B379 Candidiasis, unspecified: Secondary | ICD-10-CM

## 2017-05-08 LAB — POCT URINALYSIS DIP (DEVICE)
BILIRUBIN URINE: NEGATIVE
GLUCOSE, UA: NEGATIVE mg/dL
Ketones, ur: NEGATIVE mg/dL
LEUKOCYTES UA: NEGATIVE
NITRITE: NEGATIVE
Protein, ur: NEGATIVE mg/dL
Specific Gravity, Urine: 1.015 (ref 1.005–1.030)
UROBILINOGEN UA: 0.2 mg/dL (ref 0.0–1.0)
pH: 7 (ref 5.0–8.0)

## 2017-05-08 MED ORDER — CLOTRIMAZOLE 1 % VA CREA: 1.0000 | TOPICAL_CREAM | Freq: Every day | VAGINAL | 0 refills | Status: AC

## 2017-05-08 MED ORDER — FERROUS SULFATE 325 (65 FE) MG PO TABS: 325.0000 mg | ORAL_TABLET | Freq: Two times a day (BID) | ORAL | 1 refills | Status: DC

## 2017-05-08 NOTE — Progress Notes (Signed)

## 2017-05-08 NOTE — Progress Notes (Signed)
   PRENATAL VISIT NOTE  Subjective:  Cheyenne Lozano is a 26 y.o. G2P0010 at 2444w4d being seen today for ongoing prenatal care.  She is currently monitored for the following issues for this high-risk pregnancy and has Type 2 diabetes mellitus affecting pregnancy, antepartum; Supervision of high risk pregnancy, antepartum; Anemia of pregnancy, unspecified trimester; Bacterial vaginitis; and Abdominal cramping affecting pregnancy on their problem list.  Patient reports "cottage cheese discharge". Otherwise feeling well. Contractions: Not present. Vag. Bleeding: None.  Movement: Present. Denies leaking of fluid.   T2DM Fasting: 50-70s PP: all < 120  The following portions of the patient's history were reviewed and updated as appropriate: allergies, current medications, past family history, past medical history, past social history, past surgical history and problem list. Problem list updated.  Objective:   Vitals:   05/08/17 1117  BP: 136/77  Pulse: (!) 101  Weight: 149 lb (67.6 kg)    Fetal Status: Fetal Heart Rate (bpm): NST   Movement: Present     General:  Alert, oriented and cooperative. Patient is in no acute distress.  Skin: Skin is warm and dry. No rash noted.   Cardiovascular: Normal heart rate noted  Respiratory: Normal respiratory effort, no problems with respiration noted  Abdomen: Soft, gravid, appropriate for gestational age.  Pain/Pressure: Present     Pelvic: Cervical exam deferred        Extremities: Normal range of motion.     Mental Status:  Normal mood and affect. Normal behavior. Normal judgment and thought content.   Assessment and Plan:  Pregnancy: G2P0010 at 2044w4d  1. Supervision of high risk pregnancy, antepartum  2. Type 2 diabetes mellitus affecting pregnancy, antepartum - diet controlled Well controlled, some hypoglycemia with dizziness (BS in 50s) Encouraged her to eat more at night NST/BPP today 10/10 Cont weekly NST/BPP  3. Yeast  infection Clotrimazole sent to pharmacy   Preterm labor symptoms and general obstetric precautions including but not limited to vaginal bleeding, contractions, leaking of fluid and fetal movement were reviewed in detail with the patient. Please refer to After Visit Summary for other counseling recommendations.  Return in about 10 days (around 05/18/2017) for NST/BPP and HOB.   Conan BowensKelly M Damyiah Moxley, MD

## 2017-05-08 NOTE — Progress Notes (Signed)
Pt states she has sx of yeast - thick white vaginal d/c, denies itching.

## 2017-05-09 ENCOUNTER — Telehealth: Payer: Self-pay | Admitting: General Practice

## 2017-05-09 NOTE — Telephone Encounter (Signed)
Patient called and left message on nurse line stating she hasn't been feeling good and checked her blood sugar 2 hours after eating and it was 168. Called patient and she states that she continues to feel dizzy and lightheaded and her blood sugar was 168 yesterday 2 hours after eating. Asked patient what she ate and she states two chicken wings but nothing else. Patient also reports blood sugar fasting today of 119, 2 hr post breakfast 132 and 2 hr post lunch 162. Patient states she has eaten bacon & grits, trail mix, chicken & rice soup and mandarin oranges. Told patient I would send a message to Dr Earlene Plateravis and call her back whenever I hear from her. Patient verbalized understanding & had no questions.

## 2017-05-10 ENCOUNTER — Telehealth: Payer: Self-pay | Admitting: Obstetrics and Gynecology

## 2017-05-10 NOTE — Telephone Encounter (Signed)
Returned patient call.  Patient reports dizziness and "feeling like she is going to pass out." Reports she feels constant dizziness. BG has been higher than reported at prior appointment.  Today  Fasting: 119 PP: 152,178  Had previously encouraged her to eat more, reviewed that she should not eat significantly more. Encouraged her to drink more as well, she verbalizes understanding of all of this.    Baldemar LenisK. Meryl Davis, M.D. Attending Obstetrician & Gynecologist, [facility], [facility]

## 2017-05-12 ENCOUNTER — Ambulatory Visit (HOSPITAL_COMMUNITY)
Admission: RE | Admit: 2017-05-12 | Discharge: 2017-05-12 | Disposition: A | Discharge status: Home / Self Care | Payer: 59 | Source: Ambulatory Visit | Attending: Obstetrics and Gynecology | Admitting: Obstetrics and Gynecology

## 2017-05-12 ENCOUNTER — Other Ambulatory Visit (HOSPITAL_COMMUNITY): Payer: Self-pay | Admitting: Maternal & Fetal Medicine

## 2017-05-12 DIAGNOSIS — Z3A32 32 weeks gestation of pregnancy: Secondary | ICD-10-CM

## 2017-05-12 DIAGNOSIS — O09893 Supervision of other high risk pregnancies, third trimester: Secondary | ICD-10-CM | POA: Insufficient documentation

## 2017-05-12 DIAGNOSIS — Z362 Encounter for other antenatal screening follow-up: Secondary | ICD-10-CM | POA: Diagnosis present

## 2017-05-12 DIAGNOSIS — O24113 Pre-existing diabetes mellitus, type 2, in pregnancy, third trimester: Secondary | ICD-10-CM | POA: Diagnosis present

## 2017-05-12 DIAGNOSIS — O24119 Pre-existing diabetes mellitus, type 2, in pregnancy, unspecified trimester: Secondary | ICD-10-CM

## 2017-05-18 ENCOUNTER — Ambulatory Visit: Payer: Self-pay

## 2017-05-18 ENCOUNTER — Encounter: Payer: Self-pay | Admitting: Obstetrics and Gynecology

## 2017-05-18 ENCOUNTER — Ambulatory Visit (INDEPENDENT_AMBULATORY_CARE_PROVIDER_SITE_OTHER): Payer: 59 | Admitting: Obstetrics and Gynecology

## 2017-05-18 ENCOUNTER — Ambulatory Visit: Payer: 59 | Admitting: *Deleted

## 2017-05-18 VITALS — BP 129/75 | HR 93 | Wt 150.9 lb

## 2017-05-18 DIAGNOSIS — O24119 Pre-existing diabetes mellitus, type 2, in pregnancy, unspecified trimester: Secondary | ICD-10-CM

## 2017-05-18 DIAGNOSIS — O0990 Supervision of high risk pregnancy, unspecified, unspecified trimester: Secondary | ICD-10-CM

## 2017-05-18 DIAGNOSIS — O0993 Supervision of high risk pregnancy, unspecified, third trimester: Secondary | ICD-10-CM

## 2017-05-18 DIAGNOSIS — O24113 Pre-existing diabetes mellitus, type 2, in pregnancy, third trimester: Secondary | ICD-10-CM

## 2017-05-18 LAB — POCT URINALYSIS DIP (DEVICE)
Bilirubin Urine: NEGATIVE
Glucose, UA: NEGATIVE mg/dL
HGB URINE DIPSTICK: NEGATIVE
Ketones, ur: NEGATIVE mg/dL
Leukocytes, UA: NEGATIVE
NITRITE: NEGATIVE
Protein, ur: NEGATIVE mg/dL
SPECIFIC GRAVITY, URINE: 1.01 (ref 1.005–1.030)
Urobilinogen, UA: 0.2 mg/dL (ref 0.0–1.0)
pH: 6.5 (ref 5.0–8.0)

## 2017-05-18 NOTE — Addendum Note (Signed)
Addended by: Jill SideAY, DIANE L on: 05/18/2017 03:24 PM   Modules accepted: Orders

## 2017-05-18 NOTE — Progress Notes (Signed)
Pt reports having dizziness x2 weeks.  Pt states she has not taken Zoloft for 4 years - removed form Medication list.  Pt has elevated PHQ-9 survey. [facility] and pt agreed to meet with her @ next visit.

## 2017-05-18 NOTE — Progress Notes (Signed)
   PRENATAL VISIT NOTE  Subjective:  Cheyenne Lozano is a 26 y.o. G2P0010 at 3257w0d being seen today for ongoing prenatal care.  She is currently monitored for the following issues for this high-risk pregnancy and has Type 2 diabetes mellitus affecting pregnancy, antepartum; Supervision of high risk pregnancy, antepartum; Anemia of pregnancy, unspecified trimester; Bacterial vaginitis; and Abdominal cramping affecting pregnancy on their problem list.  Patient reports continued dizziness that she believes is due to anxiety now, reviewed strategies for improving anxiety.  Contractions: Irregular. Vag. Bleeding: None.  Movement: Present. Denies leaking of fluid.   T2DM Fasting: week of thanksgiving, all high but since, all within normal range PP: week of thanksgiving, all high, but since, much improved  The following portions of the patient's history were reviewed and updated as appropriate: allergies, current medications, past family history, past medical history, past social history, past surgical history and problem list. Problem list updated.  Objective:   Vitals:   05/18/17 0857  BP: 129/75  Pulse: 93  Weight: 150 lb 14.4 oz (68.4 kg)    Fetal Status: Fetal Heart Rate (bpm): NST   Movement: Present     General:  Alert, oriented and cooperative. Patient is in no acute distress.  Skin: Skin is warm and dry. No rash noted.   Cardiovascular: Normal heart rate noted  Respiratory: Normal respiratory effort, no problems with respiration noted  Abdomen: Soft, gravid, appropriate for gestational age.  Pain/Pressure: Present     Pelvic: Cervical exam deferred        Extremities: Normal range of motion.  Edema: Trace  Mental Status:  Normal mood and affect. Normal behavior. Normal judgment and thought content.   Assessment and Plan:  Pregnancy: G2P0010 at 5657w0d  1. Supervision of high risk pregnancy, antepartum  2. Type 2 diabetes mellitus affecting pregnancy, antepartum Diet  controlled Not well controlled over last week but this week well controlled with am fasting as low as 49 Will monitor diet control for one week and start meds if remains uncontrolled NST/BPP today 8/10, non-reactive NST Cont weekly BPP/NST   Preterm labor symptoms and general obstetric precautions including but not limited to vaginal bleeding, contractions, leaking of fluid and fetal movement were reviewed in detail with the patient. Please refer to After Visit Summary for other counseling recommendations.  Return in about 1 week (around 05/25/2017) for NST/BPP and HOB weekly *Also needs to see Asher MuirJamie next week, OB visit (MD).   Conan BowensKelly M Krist Rosenboom, MD

## 2017-05-18 NOTE — Progress Notes (Addendum)
Pt informed that the ultrasound is considered a limited OB ultrasound and is not intended to be a complete ultrasound exam.  Patient also informed that the ultrasound is not being completed with the intent of assessing for fetal or placental anomalies or any pelvic abnormalities.  Explained that the purpose of today's ultrasound is to assess for presentation, BPP and amniotic fluid volume.  Patient acknowledges the purpose of the exam and the limitations of the study.    US for growth scheduled on 12/20, BPP added

## 2017-05-26 ENCOUNTER — Ambulatory Visit (INDEPENDENT_AMBULATORY_CARE_PROVIDER_SITE_OTHER): Payer: 59 | Admitting: Obstetrics & Gynecology

## 2017-05-26 ENCOUNTER — Ambulatory Visit: Payer: Self-pay

## 2017-05-26 ENCOUNTER — Ambulatory Visit (INDEPENDENT_AMBULATORY_CARE_PROVIDER_SITE_OTHER): Payer: 59 | Admitting: *Deleted

## 2017-05-26 VITALS — BP 129/84 | HR 97 | Wt 153.0 lb

## 2017-05-26 DIAGNOSIS — O24119 Pre-existing diabetes mellitus, type 2, in pregnancy, unspecified trimester: Secondary | ICD-10-CM

## 2017-05-26 DIAGNOSIS — O24113 Pre-existing diabetes mellitus, type 2, in pregnancy, third trimester: Secondary | ICD-10-CM

## 2017-05-26 DIAGNOSIS — O0990 Supervision of high risk pregnancy, unspecified, unspecified trimester: Secondary | ICD-10-CM

## 2017-05-26 DIAGNOSIS — O0993 Supervision of high risk pregnancy, unspecified, third trimester: Secondary | ICD-10-CM

## 2017-05-26 LAB — POCT URINALYSIS DIP (DEVICE)
BILIRUBIN URINE: NEGATIVE
Glucose, UA: NEGATIVE mg/dL
HGB URINE DIPSTICK: NEGATIVE
KETONES UR: NEGATIVE mg/dL
Leukocytes, UA: NEGATIVE
Nitrite: NEGATIVE
PH: 7 (ref 5.0–8.0)
Protein, ur: NEGATIVE mg/dL
Specific Gravity, Urine: 1.015 (ref 1.005–1.030)
Urobilinogen, UA: 0.2 mg/dL (ref 0.0–1.0)

## 2017-05-26 MED ORDER — ONDANSETRON 4 MG PO TBDP: 4.0000 mg | ORAL_TABLET | Freq: Three times a day (TID) | ORAL | 3 refills | Status: DC | PRN

## 2017-05-26 MED ORDER — METFORMIN HCL 500 MG PO TABS: 500.0000 mg | ORAL_TABLET | Freq: Two times a day (BID) | ORAL | 5 refills | Status: DC

## 2017-05-26 NOTE — Progress Notes (Signed)

## 2017-05-26 NOTE — Progress Notes (Signed)
Pt reports increased vaginal d/c - white w/no odor irritation or itching. She requests refill of Ondansetron.  Pt is concerned about low weight gain. Next US for growth and BPP scheduled on 12/20

## 2017-05-26 NOTE — Progress Notes (Signed)
   PRENATAL VISIT NOTE  Subjective:  Cheyenne Lozano is a 26 y.o. G2P0010 at 5756w1d being seen today for ongoing prenatal care.  She is currently monitored for the following issues for this high-risk pregnancy and has Type 2 diabetes mellitus affecting pregnancy, antepartum; Supervision of high risk pregnancy, antepartum; Anemia of pregnancy, unspecified trimester; and Abdominal cramping affecting pregnancy on their problem list.  Patient reports no complaints.  Contractions: Irregular. Vag. Bleeding: None.  Movement: Present. Denies leaking of fluid.   The following portions of the patient's history were reviewed and updated as appropriate: allergies, current medications, past family history, past medical history, past social history, past surgical history and problem list. Problem list updated.  Objective:   Vitals:   05/26/17 0927  BP: 129/84  Pulse: 97  Weight: 153 lb (69.4 kg)    Fetal Status: Fetal Heart Rate (bpm): NST   Movement: Present     General:  Alert, oriented and cooperative. Patient is in no acute distress.  Skin: Skin is warm and dry. No rash noted.   Cardiovascular: Normal heart rate noted  Respiratory: Normal respiratory effort, no problems with respiration noted  Abdomen: Soft, gravid, appropriate for gestational age.  Pain/Pressure: Present     Pelvic: Cervical exam deferred        Extremities: Normal range of motion.     Mental Status:  Normal mood and affect. Normal behavior. Normal judgment and thought content.   Assessment and Plan:  Pregnancy: G2P0010 at 5656w1d  1. Type 2 diabetes mellitus affecting pregnancy, antepartum Mildly elevated blood sugars across the board, see below.    Had great response to metformin before pregnancy, will restart this now.  Hypoglycemic precautions given.  Encouraged to eat more protein; this is what she tolerates for now. Nausea medication also given per request.  NST performed today was reviewed and was found to be  reactive. Subsequent BPP performed today was also reviewed and was found to be 10/10. AFI was also normal. Continue recommended antenatal testing and prenatal care. - ondansetron (ZOFRAN-ODT) 4 MG disintegrating tablet; Take 1 tablet (4 mg total) by mouth every 8 (eight) hours as needed for nausea or vomiting.  Dispense: 20 tablet; Refill: 3 - metFORMIN (GLUCOPHAGE) 500 MG tablet; Take 1 tablet (500 mg total) by mouth 2 (two) times daily with a meal.  Dispense: 60 tablet; Refill: 5  2. Supervision of high risk pregnancy, antepartum Preterm labor symptoms and general obstetric precautions including but not limited to vaginal bleeding, contractions, leaking of fluid and fetal movement were reviewed in detail with the patient. Please refer to After Visit Summary for other counseling recommendations.  Return in about 3 weeks (around 06/16/2017) for NST/BPP and HOB.   Jaynie CollinsUgonna Zamira Hickam, MD

## 2017-06-02 ENCOUNTER — Ambulatory Visit (INDEPENDENT_AMBULATORY_CARE_PROVIDER_SITE_OTHER): Payer: 59 | Admitting: *Deleted

## 2017-06-02 ENCOUNTER — Other Ambulatory Visit (HOSPITAL_COMMUNITY)
Admission: RE | Admit: 2017-06-02 | Discharge: 2017-06-02 | Disposition: A | Discharge status: Home / Self Care | Payer: 59 | Source: Ambulatory Visit | Attending: Obstetrics & Gynecology | Admitting: Obstetrics & Gynecology

## 2017-06-02 ENCOUNTER — Ambulatory Visit (INDEPENDENT_AMBULATORY_CARE_PROVIDER_SITE_OTHER): Payer: 59 | Admitting: Obstetrics & Gynecology

## 2017-06-02 ENCOUNTER — Ambulatory Visit: Payer: Self-pay

## 2017-06-02 VITALS — BP 133/79 | HR 105 | Wt 153.0 lb

## 2017-06-02 DIAGNOSIS — O24113 Pre-existing diabetes mellitus, type 2, in pregnancy, third trimester: Secondary | ICD-10-CM

## 2017-06-02 DIAGNOSIS — Z3A35 35 weeks gestation of pregnancy: Secondary | ICD-10-CM | POA: Diagnosis not present

## 2017-06-02 DIAGNOSIS — E119 Type 2 diabetes mellitus without complications: Secondary | ICD-10-CM | POA: Diagnosis not present

## 2017-06-02 DIAGNOSIS — O24119 Pre-existing diabetes mellitus, type 2, in pregnancy, unspecified trimester: Secondary | ICD-10-CM

## 2017-06-02 DIAGNOSIS — O0993 Supervision of high risk pregnancy, unspecified, third trimester: Secondary | ICD-10-CM

## 2017-06-02 DIAGNOSIS — O0990 Supervision of high risk pregnancy, unspecified, unspecified trimester: Secondary | ICD-10-CM

## 2017-06-02 LAB — POCT URINALYSIS DIP (DEVICE)
Bilirubin Urine: NEGATIVE
Glucose, UA: NEGATIVE mg/dL
Hgb urine dipstick: NEGATIVE
Ketones, ur: NEGATIVE mg/dL
Leukocytes, UA: NEGATIVE
Nitrite: NEGATIVE
Protein, ur: NEGATIVE mg/dL
Specific Gravity, Urine: 1.01 (ref 1.005–1.030)
Urobilinogen, UA: 0.2 mg/dL (ref 0.0–1.0)
pH: 7 (ref 5.0–8.0)

## 2017-06-02 LAB — OB RESULTS CONSOLE GC/CHLAMYDIA: GC PROBE AMP, GENITAL: NEGATIVE

## 2017-06-02 NOTE — Patient Instructions (Signed)

## 2017-06-02 NOTE — Progress Notes (Signed)
Pt reports increased abdominal cramping and UC's.  She desires Cx exam today. Pt states she always feels dizzy. She is not taking Metformin twice daily - only once.

## 2017-06-02 NOTE — Progress Notes (Signed)

## 2017-06-02 NOTE — Progress Notes (Signed)
   PRENATAL VISIT NOTE  Subjective:  Cheyenne Lozano is a 26 y.o. G2P0010 at 6357w1d being seen today for ongoing prenatal care.  She is currently monitored for the following issues for this high-risk pregnancy and has Type 2 diabetes mellitus affecting pregnancy, antepartum; Supervision of high risk pregnancy, antepartum; Anemia of pregnancy, unspecified trimester; and Abdominal cramping affecting pregnancy on their problem list.  Patient reports occasional contractions and stomach pain associated with Metformin.  Contractions: Irregular. Vag. Bleeding: None.  Movement: Present. Denies leaking of fluid.   The following portions of the patient's history were reviewed and updated as appropriate: allergies, current medications, past family history, past medical history, past social history, past surgical history and problem list. Problem list updated.  Objective:   Vitals:   06/02/17 0811  BP: 133/79  Pulse: (!) 105  Weight: 153 lb (69.4 kg)    Fetal Status: Fetal Heart Rate (bpm): NST   Movement: Present     General:  Alert, oriented and cooperative. Patient is in no acute distress.  Skin: Skin is warm and dry. No rash noted.   Cardiovascular: Normal heart rate noted  Respiratory: Normal respiratory effort, no problems with respiration noted  Abdomen: Soft, gravid, appropriate for gestational age.  Pain/Pressure: Present     Pelvic: Cervical exam deferred        Extremities: Normal range of motion.  Edema: Trace  Mental Status:  Normal mood and affect. Normal behavior. Normal judgment and thought content.   Assessment and Plan:  Pregnancy: G2P0010 at 6957w1d  1. Supervision of high risk pregnancy, antepartum NST and BPP reassuring today  2. Type 2 diabetes mellitus affecting pregnancy, antepartum FBS nl and PP most wnl, up to 128  Preterm labor symptoms and general obstetric precautions including but not limited to vaginal bleeding, contractions, leaking of fluid and fetal movement  were reviewed in detail with the patient. Please refer to After Visit Summary for other counseling recommendations.  Return in about 7 days (around 06/09/2017) for weekly @ scheduled.   Scheryl DarterJames Jaelin Devincentis, MD

## 2017-06-05 LAB — CERVICOVAGINAL ANCILLARY ONLY
CHLAMYDIA, DNA PROBE: NEGATIVE
Neisseria Gonorrhea: NEGATIVE

## 2017-06-07 LAB — STREP GP B NAA

## 2017-06-08 ENCOUNTER — Ambulatory Visit (HOSPITAL_COMMUNITY): Payer: 59

## 2017-06-09 ENCOUNTER — Ambulatory Visit (INDEPENDENT_AMBULATORY_CARE_PROVIDER_SITE_OTHER): Payer: 59 | Admitting: *Deleted

## 2017-06-09 ENCOUNTER — Ambulatory Visit (HOSPITAL_COMMUNITY)
Admission: RE | Admit: 2017-06-09 | Discharge: 2017-06-09 | Disposition: A | Discharge status: Home / Self Care | Payer: 59 | Source: Ambulatory Visit | Attending: Maternal & Fetal Medicine | Admitting: Maternal & Fetal Medicine

## 2017-06-09 ENCOUNTER — Ambulatory Visit (INDEPENDENT_AMBULATORY_CARE_PROVIDER_SITE_OTHER): Payer: 59 | Admitting: Obstetrics and Gynecology

## 2017-06-09 VITALS — BP 134/83 | HR 85 | Wt 154.1 lb

## 2017-06-09 DIAGNOSIS — Z362 Encounter for other antenatal screening follow-up: Secondary | ICD-10-CM | POA: Insufficient documentation

## 2017-06-09 DIAGNOSIS — O24119 Pre-existing diabetes mellitus, type 2, in pregnancy, unspecified trimester: Secondary | ICD-10-CM

## 2017-06-09 DIAGNOSIS — O24113 Pre-existing diabetes mellitus, type 2, in pregnancy, third trimester: Secondary | ICD-10-CM | POA: Diagnosis present

## 2017-06-09 DIAGNOSIS — O0990 Supervision of high risk pregnancy, unspecified, unspecified trimester: Secondary | ICD-10-CM

## 2017-06-09 DIAGNOSIS — Z3A36 36 weeks gestation of pregnancy: Secondary | ICD-10-CM | POA: Diagnosis not present

## 2017-06-09 LAB — POCT URINALYSIS DIP (DEVICE)
BILIRUBIN URINE: NEGATIVE
Glucose, UA: NEGATIVE mg/dL
HGB URINE DIPSTICK: NEGATIVE
KETONES UR: NEGATIVE mg/dL
LEUKOCYTES UA: NEGATIVE
NITRITE: NEGATIVE
PH: 7 (ref 5.0–8.0)
Protein, ur: NEGATIVE mg/dL
Specific Gravity, Urine: 1.015 (ref 1.005–1.030)
Urobilinogen, UA: 0.2 mg/dL (ref 0.0–1.0)

## 2017-06-09 LAB — OB RESULTS CONSOLE GBS: GBS: POSITIVE

## 2017-06-09 NOTE — Progress Notes (Signed)
cw

## 2017-06-09 NOTE — Progress Notes (Signed)
US for growth and BPP today @ [facility]

## 2017-06-09 NOTE — Progress Notes (Signed)
   PRENATAL VISIT NOTE  Subjective:  Cheyenne Lozano is a 26 y.o. G2P0010 at 6762w1d being seen today for ongoing prenatal care.  She is currently monitored for the following issues for this high-risk pregnancy and has Type 2 diabetes mellitus affecting pregnancy, antepartum; Supervision of high risk pregnancy, antepartum; Anemia of pregnancy, unspecified trimester; and Abdominal cramping affecting pregnancy on their problem list.  Patient reports occasional contractions.  Contractions: Irregular. Vag. Bleeding: None.  Movement: Present. Denies leaking of fluid.   T2DM - metformin 500 mg daily FG: 68-86 PP: 108-134, about half >120  The following portions of the patient's history were reviewed and updated as appropriate: allergies, current medications, past family history, past medical history, past social history, past surgical history and problem list. Problem list updated.  Objective:   Vitals:   06/09/17 0817  BP: 134/83  Pulse: 85  Weight: 154 lb 1.6 oz (69.9 kg)    Fetal Status: Fetal Heart Rate (bpm): NST   Movement: Present     General:  Alert, oriented and cooperative. Patient is in no acute distress.  Skin: Skin is warm and dry. No rash noted.   Cardiovascular: Normal heart rate noted  Respiratory: Normal respiratory effort, no problems with respiration noted  Abdomen: Soft, gravid, appropriate for gestational age.  Pain/Pressure: Present     Pelvic: Cervical exam deferred        Extremities: Normal range of motion.     Mental Status:  Normal mood and affect. Normal behavior. Normal judgment and thought content.   Assessment and Plan:  Pregnancy: G2P0010 at 662w1d  1. Supervision of high risk pregnancy, antepartum GBS done today  2. Type 2 diabetes mellitus affecting pregnancy, antepartum Metformin 500 mg daily started last week NST today reactive BPP at MFM today   Preterm labor symptoms and general obstetric precautions including but not limited to vaginal  bleeding, contractions, leaking of fluid and fetal movement were reviewed in detail with the patient. Please refer to After Visit Summary for other counseling recommendations.  Return in about 7 days (around 06/16/2017) for as scheduled.   Conan BowensKelly M Machi Whittaker, MD

## 2017-06-11 LAB — STREP GP B NAA: STREP GROUP B AG: POSITIVE — AB

## 2017-06-16 ENCOUNTER — Ambulatory Visit (INDEPENDENT_AMBULATORY_CARE_PROVIDER_SITE_OTHER): Payer: 59 | Admitting: General Practice

## 2017-06-16 ENCOUNTER — Encounter: Payer: 59 | Admitting: Obstetrics & Gynecology

## 2017-06-16 ENCOUNTER — Ambulatory Visit: Payer: Self-pay

## 2017-06-16 ENCOUNTER — Ambulatory Visit (INDEPENDENT_AMBULATORY_CARE_PROVIDER_SITE_OTHER): Payer: 59 | Admitting: Obstetrics & Gynecology

## 2017-06-16 VITALS — BP 131/86 | HR 90 | Wt 156.0 lb

## 2017-06-16 DIAGNOSIS — O24119 Pre-existing diabetes mellitus, type 2, in pregnancy, unspecified trimester: Secondary | ICD-10-CM

## 2017-06-16 DIAGNOSIS — O24113 Pre-existing diabetes mellitus, type 2, in pregnancy, third trimester: Secondary | ICD-10-CM

## 2017-06-16 DIAGNOSIS — O99820 Streptococcus B carrier state complicating pregnancy: Secondary | ICD-10-CM

## 2017-06-16 DIAGNOSIS — O0990 Supervision of high risk pregnancy, unspecified, unspecified trimester: Secondary | ICD-10-CM

## 2017-06-16 DIAGNOSIS — O163 Unspecified maternal hypertension, third trimester: Secondary | ICD-10-CM

## 2017-06-16 LAB — POCT URINALYSIS DIP (DEVICE)
BILIRUBIN URINE: NEGATIVE
Glucose, UA: NEGATIVE mg/dL
KETONES UR: NEGATIVE mg/dL
Nitrite: NEGATIVE
PH: 7 (ref 5.0–8.0)
Protein, ur: NEGATIVE mg/dL
Specific Gravity, Urine: 1.015 (ref 1.005–1.030)
Urobilinogen, UA: 1 mg/dL (ref 0.0–1.0)

## 2017-06-16 NOTE — Progress Notes (Addendum)
PRENATAL VISIT NOTE  Subjective:  Cheyenne Lozano is a 26 y.o. G2P0010 at [redacted]w[redacted]d being seen today for ongoing prenatal care.  She is currently monitored for the following issues for this high-risk pregnancy and has Type 2 diabetes mellitus affecting pregnancy, antepartum; Supervision of high risk pregnancy, antepartum; Anemia of pregnancy, unspecified trimester; and Group B Streptococcus carrier, +RV culture, currently pregnant on their problem list.  Patient reports no complaints.  Contractions: Irregular. Vag. Bleeding: None.  Movement: Present. Denies leaking of fluid.   The following portions of the patient's history were reviewed and updated as appropriate: allergies, current medications, past family history, past medical history, past social history, past surgical history and problem list. Problem list updated.  Objective:   Vitals:   06/16/17 0820 06/16/17 0822  BP: 137/90 131/86  Pulse: 90   Weight: 156 lb (70.8 kg)     Fetal Status: Fetal Heart Rate (bpm): NST Fundal Height: 37 cm Movement: Present  Presentation: Vertex  General:  Alert, oriented and cooperative. Patient is in no acute distress.  Skin: Skin is warm and dry. No rash noted.   Cardiovascular: Normal heart rate noted  Respiratory: Normal respiratory effort, no problems with respiration noted  Abdomen: Soft, gravid, appropriate for gestational age.  Pain/Pressure: Present     Pelvic: Cervical exam performed Dilation: Closed Effacement (%): Thick Station: Ballotable  Extremities: Normal range of motion.  Edema: Trace  Mental Status:  Normal mood and affect. Normal behavior. Normal judgment and thought content.   [place] Mfm Fetal Bpp Wo Non Stress  Result Date: 06/09/2017 ----------------------------------------------------------------------  OBSTETRICS REPORT                      (Signed Final 06/09/2017 04:13 pm) ---------------------------------------------------------------------- Patient Info  ID #:        [id number]                          D.O.B.:  1990/11/28 (26 yrs)  Name:       Cheyenne Lozano                Visit Date: 06/09/2017 10:53 am ---------------------------------------------------------------------- Performed By  Performed By:     Earley Brooke     Ref. Address:     [facility]                                                             OB[facility]Gyn Clinic                                                             [address]                                                             [place], [place]                                                             [address]  Attending:        Darlyn Read MD         Location:         [facility]  Referred By:      [facility] for                    [facility]                    Healthcare ---------------------------------------------------------------------- Orders   #  Description                                 Code   1  [place] MFM OB FOLLOW UP                         60454.09   2  [place] MFM FETAL BPP WO NON STRESS              81191.47  ----------------------------------------------------------------------   #  Ordered By               Order #        Accession #    Episode #   1  Darlyn Read              [id number]      [id number]     [id number]   2  KELLY DAVIS              [id number]      [id number]     [id number]  ---------------------------------------------------------------------- Indications   [redacted] weeks gestation of pregnancy                Z3A.36   Pre-existing diabetes, type 2, in pregnancy,   O24.113   third trimester   Encounter for other antenatal screening        Z36.2   follow-up  ---------------------------------------------------------------------- OB History  Gravidity:    2         Term:   0        Prem:   0        SAB:   1  TOP:          0       Ectopic:  0        Living: 0  ---------------------------------------------------------------------- Fetal Evaluation  Num Of Fetuses:     1  Fetal Heart         135  Rate(bpm):  Cardiac Activity:   Observed  Presentation:       Cephalic  Placenta:           Posterior, above cervical os  P. Cord Insertion:  Previously Visualized  Amniotic Fluid  AFI FV:      Subjectively within normal limits  AFI Sum(cm)     %Tile       Largest Pocket(cm)  16.65           62          5.17  RUQ(cm)       RLQ(cm)       LUQ(cm)        LLQ(cm)  4.71          5.17          2.73           4.04 ---------------------------------------------------------------------- Biophysical Evaluation  Amniotic F.V:   Pocket => 2 cm two         F. Tone:        Observed                  planes  F. Movement:    Observed                   Score:          8/8  F. Breathing:   Observed ---------------------------------------------------------------------- Biometry  BPD:      88.8  mm     G. Age:  35w 6d         53  %    CI:        80.36   %    70 - 86                                                          FL/HC:      20.9   %    20.1 - 22.1  HC:      312.9  mm     G. Age:  35w 1d          7  %    HC/AC:      0.95        0.93 - 1.11  AC:      328.7  mm     G. Age:  36w 5d         77  %    FL/BPD:     73.5   %    71 - 87  FL:       65.3  mm     G. Age:  33w 5d          4  %    FL/AC:      19.9   %    20 - 24  Est. FW:    2750  gm      6 lb 1 oz     56  % ---------------------------------------------------------------------- Gestational Age  LMP:           36w 1d        Date:  09/29/16                 EDD:   07/06/17  U/S Today:     35w 3d  EDD:   07/11/17  Best:          36w 1d     Det. By:  LMP  (09/29/16)          EDD:   07/06/17 ---------------------------------------------------------------------- Anatomy  Cranium:               Appears normal         Aortic Arch:            Previously seen  Cavum:                 Previously seen        Ductal  Arch:            Previously seen  Ventricles:            Appears normal         Diaphragm:              Appears normal  Choroid Plexus:        Resolved CPC           Stomach:                Appears normal, left                                                                        sided  Cerebellum:            Previously seen        Abdomen:                Previously seen  Posterior Fossa:       Previously seen        Abdominal Wall:         Previously seen  Nuchal Fold:           Previously seen        Cord Vessels:           Previously seen  Face:                  Orbits and profile     Kidneys:                Appear normal                         previously seen  Lips:                  Previously seen        Bladder:                Appears normal  Thoracic:              Appears normal         Spine:                  Previously seen  Heart:                 Previously seen        Upper Extremities:      Previously seen  RVOT:  Previously seen        Lower Extremities:      Previously seen  LVOT:                  Previously seen  Other:  Female gender previously seen. Heels and 5th digit prev. visualized.          Open hands prev. visualized. Nasal bone prev. visualized. ---------------------------------------------------------------------- Cervix Uterus Adnexa  Cervix  Not visualized (advanced GA >29wks)  Left Ovary  Previously seen.  Right Ovary  Previously seen ---------------------------------------------------------------------- Impression  Single living intrauterine pregnancy at 36w 1d.  Cephalic presentation.  Placenta Posterior, above cervical os.  Normal amniotic fluid volume.  Appropriate interval fetal growth.  Normal interval fetal anatomy.  BPP 8/8. ---------------------------------------------------------------------- Recommendations  Continue antenatal testing.  Recommend delivery by 39 weeks if maternal and fetal status  remain reassuring.  ----------------------------------------------------------------------                   Darlyn Read, MD Electronically Signed Final Report   06/09/2017 04:13 pm ----------------------------------------------------------------------  [place] Mfm Ob Follow Up  Result Date: 06/09/2017 ----------------------------------------------------------------------  OBSTETRICS REPORT                      (Signed Final 06/09/2017 04:13 pm) ---------------------------------------------------------------------- Patient Info  ID #:       [id number]                          D.O.B.:  1991-05-16 (26 yrs)  Name:       Cheyenne Lozano                Visit Date: 06/09/2017 10:53 am ---------------------------------------------------------------------- Performed By  Performed By:     Earley Brooke     Ref. Address:     [facility]                                                             OB/Gyn Clinic                                                             [address]                                                             [place], [place]  [address]  Attending:        Darlyn Read MD         Location:         [facility]  Referred By:      [facility] for                    [facility]                    Healthcare ---------------------------------------------------------------------- Orders   #  Description                                 Code   1  [place] MFM OB FOLLOW UP                         60454.09   2  [place] MFM FETAL BPP WO NON STRESS              81191.47  ----------------------------------------------------------------------   #  Ordered By               Order #        Accession #    Episode #   1  Darlyn Read              [id number]      [id number]     [id number]   2  KELLY DAVIS              [id number]      [id number]     [id number]   ---------------------------------------------------------------------- Indications   [redacted] weeks gestation of pregnancy                Z3A.36   Pre-existing diabetes, type 2, in pregnancy,   O24.113   third trimester   Encounter for other antenatal screening        Z36.2   follow-up  ---------------------------------------------------------------------- OB History  Gravidity:    2         Term:   0        Prem:   0        SAB:   1  TOP:          0       Ectopic:  0        Living: 0 ---------------------------------------------------------------------- Fetal Evaluation  Num Of Fetuses:     1  Fetal Heart         135  Rate(bpm):  Cardiac Activity:   Observed  Presentation:       Cephalic  Placenta:           Posterior, above cervical os  P. Cord Insertion:  Previously Visualized  Amniotic Fluid  AFI FV:      Subjectively within normal limits  AFI Sum(cm)     %Tile       Largest Pocket(cm)  16.65           62          5.17  RUQ(cm)       RLQ(cm)       LUQ(cm)        LLQ(cm)  4.71          5.17          2.73  4.04 ---------------------------------------------------------------------- Biophysical Evaluation  Amniotic F.V:   Pocket => 2 cm two         F. Tone:        Observed                  planes  F. Movement:    Observed                   Score:          8/8  F. Breathing:   Observed ---------------------------------------------------------------------- Biometry  BPD:      88.8  mm     G. Age:  35w 6d         53  %    CI:        80.36   %    70 - 86                                                          FL/HC:      20.9   %    20.1 - 22.1  HC:      312.9  mm     G. Age:  35w 1d          7  %    HC/AC:      0.95        0.93 - 1.11  AC:      328.7  mm     G. Age:  36w 5d         77  %    FL/BPD:     73.5   %    71 - 87  FL:       65.3  mm     G. Age:  33w 5d          4  %    FL/AC:      19.9   %    20 - 24  Est. FW:    2750  gm      6 lb 1 oz     56  %  ---------------------------------------------------------------------- Gestational Age  LMP:           36w 1d        Date:  09/29/16                 EDD:   07/06/17  U/S Today:     35w 3d                                        EDD:   07/11/17  Best:          36w 1d     Det. By:  LMP  (09/29/16)          EDD:   07/06/17 ---------------------------------------------------------------------- Anatomy  Cranium:               Appears normal         Aortic Arch:            Previously seen  Cavum:                 Previously seen        Ductal  Arch:            Previously seen  Ventricles:            Appears normal         Diaphragm:              Appears normal  Choroid Plexus:        Resolved CPC           Stomach:                Appears normal, left                                                                        sided  Cerebellum:            Previously seen        Abdomen:                Previously seen  Posterior Fossa:       Previously seen        Abdominal Wall:         Previously seen  Nuchal Fold:           Previously seen        Cord Vessels:           Previously seen  Face:                  Orbits and profile     Kidneys:                Appear normal                         previously seen  Lips:                  Previously seen        Bladder:                Appears normal  Thoracic:              Appears normal         Spine:                  Previously seen  Heart:                 Previously seen        Upper Extremities:      Previously seen  RVOT:                  Previously seen        Lower Extremities:      Previously seen  LVOT:                  Previously seen  Other:  Female gender previously seen. Heels and 5th digit prev. visualized.          Open hands prev. visualized. Nasal bone prev. visualized. ---------------------------------------------------------------------- Cervix Uterus Adnexa  Cervix  Not visualized (advanced GA >29wks)  Left Ovary  Previously seen.  Right Ovary  Previously seen  ---------------------------------------------------------------------- Impression  Single living intrauterine pregnancy at 36w 1d.  Cephalic presentation.  Placenta Posterior, above cervical  os.  Normal amniotic fluid volume.  Appropriate interval fetal growth.  Normal interval fetal anatomy.  BPP 8/8. ---------------------------------------------------------------------- Recommendations  Continue antenatal testing.  Recommend delivery by 39 weeks if maternal and fetal status  remain reassuring. ----------------------------------------------------------------------                   Darlyn ReadEmily Bunce, MD Electronically Signed Final Report   06/09/2017 04:13 pm ----------------------------------------------------------------------  [place] Fetal Bpp W/nonstress  Result Date: 05/26/2017 ----------------------------------------------------------------------  OBSTETRICS REPORT                      (Signed Final 05/26/2017 04:26 pm) ---------------------------------------------------------------------- Patient Info  ID #:       [id number]                          D.O.B.:  February 11, 1991 (26 yrs)  Name:       Cheyenne Lozano                Visit Date: 05/26/2017 11:54 am ---------------------------------------------------------------------- Performed By  Performed By:     Sedalia Mutaiane Day RNC          Ref. Address:     [facility]                                                             OB[facility]Gyn Clinic                                                             [address]                                                             [address]                                                             [place], [place]                                                             [id number]  Attending:        Jaynie CollinsUgonna Lively Haberman         [facility]  Referred By:      [facility] for                    [facility]                    Healthcare ---------------------------------------------------------------------- Orders   #  Description                                 Code   1  US FETAL BPP W/NONSTRESS                    [address].6  ----------------------------------------------------------------------   #  Ordered By               Order #        Accession #    Episode #   1  Jaynie Collins           [id number]      [id number]     [id number]  ---------------------------------------------------------------------- Service(s) Provided   US Fetal BPP W NST                                   [phone]  ---------------------------------------------------------------------- Indications   [redacted] weeks gestation of pregnancy                Z3A.34   Pre-existing diabetes, type 2, in pregnancy,   O24.113   third trimester  ---------------------------------------------------------------------- OB History  Gravidity:    2         Term:   0        Prem:   0        SAB:   1  TOP:          0       Ectopic:  0        Living: 0 ---------------------------------------------------------------------- Fetal Evaluation  Num Of Fetuses:     1  Preg. Location:     Intrauterine  Cardiac Activity:   Observed  Presentation:       Cephalic  Amniotic Fluid  AFI FV:      Subjectively within normal limits  AFI Sum(cm)     %Tile       Largest Pocket(cm)  16.6            60          6.33  RUQ(cm)       RLQ(cm)       LUQ(cm)        LLQ(cm)  3.77          3.38          3.12           6.33 ---------------------------------------------------------------------- Biophysical Evaluation  Amniotic F.V:   Pocket => 2 cm two         F. Tone:  Observed                  planes  F. Movement:    Observed                   N.S.T:          Reactive  F. Breathing:   Observed                   Score:          10/10 ---------------------------------------------------------------------- Gestational Age  LMP:           34w 1d        Date:   09/29/16                 EDD:   07/06/17  Best:          34w 1d     Det. By:  LMP  (09/29/16)          EDD:   07/06/17 ---------------------------------------------------------------------- Impression  IUP at  [redacted]w[redacted]d  Normal amniotic fluid volume  BPP 10/10 ---------------------------------------------------------------------- Recommendations  Continue antenatal testing.  Recommend delivery by 39 weeks if maternal and fetal status  remain reassuring. ----------------------------------------------------------------------               Jaynie Collins, MD Electronically Signed Final Report   05/26/2017 04:26 pm ----------------------------------------------------------------------  US Fetal Bpp W/nonstress  Result Date: 05/18/2017 ----------------------------------------------------------------------  OBSTETRICS REPORT                      (Signed Final 05/18/2017 12:42 pm) ---------------------------------------------------------------------- Patient Info  ID #:       [id number]                          D.O.B.:  Jan 22, 1991 (26 yrs)  Name:       Cheyenne Lozano                Visit Date: 05/18/2017 10:16 am ---------------------------------------------------------------------- Performed By  Performed By:     Sedalia Muta Day RNC          Ref. Address:     [facility]                                                             [address]                                                             [address]  [place], [place]                                                             [phone]  Attending:        Leroy LibmanKelly Davis MD         [facility]         [facility]                                                             [facility]  [facility]      [facility]                    [facility]                    [facility] ---------------------------------------------------------------------- Orders   #  Description                                 Code   1  US FETAL BPP W/NONSTRESS                    53664.476818.4  ----------------------------------------------------------------------   #  Ordered By               Order #        Accession #    Episode #   1  Leroy LibmanKELLY DAVIS              [id number]      [phone]     [id number]  ---------------------------------------------------------------------- Service(s) Provided   US Fetal BPP W NST                                   [phone]  ---------------------------------------------------------------------- Indications   [redacted] weeks gestation of pregnancy                Z3A.33   Pre-existing diabetes, type 2, in pregnancy,   O24.113   third trimester  ---------------------------------------------------------------------- OB History  Gravidity:    2         Term:   0        Prem:   0        SAB:   1  TOP:          0       Ectopic:  0        Living: 0 ---------------------------------------------------------------------- Fetal Evaluation  Num Of Fetuses:     1  Preg. Location:     Intrauterine  Cardiac Activity:   Observed  Presentation:       Cephalic  Amniotic Fluid  AFI FV:      Subjectively upper-normal  AFI Sum(cm)     %Tile       Largest Pocket(cm)  19.84           74          6.27  RUQ(cm)       RLQ(cm)       LUQ(cm)        LLQ(cm)  6.27          6.08          2.76           4.73 ---------------------------------------------------------------------- Biophysical Evaluation  Amniotic F.V:   Pocket => 2 cm two         F. Tone:        Observed                  planes  F. Movement:    Observed                   N.S.T:          Nonreactive  F. Breathing:   Observed                   Score:          8/10 ---------------------------------------------------------------------- Gestational Age  LMP:           33w 0d        Date:  09/29/16                 EDD:   07/06/17  Best:           33w 0d     Det. By:  LMP  (09/29/16)          EDD:   07/06/17 ---------------------------------------------------------------------- Impression  BPP 8/10, non-reactive NST ---------------------------------------------------------------------- Recommendations  Cont weekly NST/BPP ----------------------------------------------------------------------                   Leroy Libman, MD Electronically Signed Final Report   05/18/2017 12:42 pm ----------------------------------------------------------------------   Assessment and Plan:  Pregnancy: G2P0010 at [redacted]w[redacted]d  1. Elevated blood pressure complicating pregnancy in third trimester, antepartum No symptoms. Will continue to monitor. Patient told that early delivery may be indicated if she rules in for [facility]. Check labs.  - Comprehensive metabolic panel - CBC - Protein / creatinine ratio, urine  2. Type 2 diabetes mellitus affecting pregnancy, antepartum Great CBGs recorded.  Will check CBG today. IOL to be scheduled at 39 weeks - Hemoglobin A1c NST performed today was reviewed and was found to be reactive.  AFI was also normal.  Continue recommended antenatal testing and prenatal care.  3. Group B Streptococcus carrier, +RV culture, currently pregnant Will need prophylaxis in labor  4. Supervision of high risk pregnancy, antepartum Term labor symptoms and general obstetric precautions including but not limited to vaginal bleeding, contractions, leaking of fluid and fetal movement were reviewed in detail with the patient. Please refer to After Visit Summary for other counseling recommendations.  Return in about 1 week (around 06/23/2017) for BPP, NST, OB Visit (HOB).   Jaynie Collins, MD

## 2017-06-16 NOTE — Patient Instructions (Signed)
Return to clinic for any scheduled appointments or obstetric concerns, or go to [facility] for evaluation  

## 2017-06-16 NOTE — Addendum Note (Signed)
Addended by: Tammatha Cobb L on: 06/16/2017 01:53 PM   Modules accepted: Orders  

## 2017-06-17 LAB — COMPREHENSIVE METABOLIC PANEL
ALBUMIN: 3.3 g/dL — AB (ref 3.5–5.5)
ALT: 10 IU/L (ref 0–32)
AST: 15 IU/L (ref 0–40)
Albumin/Globulin Ratio: 1.3 (ref 1.2–2.2)
Alkaline Phosphatase: 93 IU/L (ref 39–117)
BUN / CREAT RATIO: 8 — AB (ref 9–23)
BUN: 5 mg/dL — ABNORMAL LOW (ref 6–20)
Bilirubin Total: 0.4 mg/dL (ref 0.0–1.2)
CALCIUM: 9 mg/dL (ref 8.7–10.2)
CO2: 20 mmol/L (ref 20–29)
CREATININE: 0.66 mg/dL (ref 0.57–1.00)
Chloride: 103 mmol/L (ref 96–106)
GFR calc Af Amer: 143 mL/min/{1.73_m2} (ref >59)
GFR, EST NON AFRICAN AMERICAN: 124 mL/min/{1.73_m2} (ref >59)
GLOBULIN, TOTAL: 2.6 g/dL (ref 1.5–4.5)
Glucose: 79 mg/dL (ref 65–99)
Potassium: 4.1 mmol/L (ref 3.5–5.2)
SODIUM: 139 mmol/L (ref 134–144)
TOTAL PROTEIN: 5.9 g/dL — AB (ref 6.0–8.5)

## 2017-06-17 LAB — PROTEIN / CREATININE RATIO, URINE
Creatinine, Urine: 56.1 mg/dL
Protein, Ur: 14.8 mg/dL
Protein/Creat Ratio: 264 mg/g{creat} — ABNORMAL HIGH (ref 0–200)

## 2017-06-17 LAB — HEMOGLOBIN A1C
Est. average glucose Bld gHb Est-mCnc: 120 mg/dL
Hgb A1c MFr Bld: 5.8 % — ABNORMAL HIGH (ref 4.8–5.6)

## 2017-06-17 LAB — CBC
Hematocrit: 29.9 % — ABNORMAL LOW (ref 34.0–46.6)
Hemoglobin: 9.7 g/dL — ABNORMAL LOW (ref 11.1–15.9)
MCH: 26.4 pg — AB (ref 26.6–33.0)
MCHC: 32.4 g/dL (ref 31.5–35.7)
MCV: 82 fL (ref 79–97)
PLATELETS: 278 10*3/uL (ref 150–379)
RBC: 3.67 x10E6/uL — ABNORMAL LOW (ref 3.77–5.28)
RDW: 14.3 % (ref 12.3–15.4)
WBC: 11.9 10*3/uL — ABNORMAL HIGH (ref 3.4–10.8)

## 2017-06-19 ENCOUNTER — Ambulatory Visit (INDEPENDENT_AMBULATORY_CARE_PROVIDER_SITE_OTHER): Payer: 59 | Admitting: *Deleted

## 2017-06-19 ENCOUNTER — Encounter: Payer: Self-pay | Admitting: General Practice

## 2017-06-19 VITALS — BP 138/86 | HR 105

## 2017-06-19 DIAGNOSIS — O24119 Pre-existing diabetes mellitus, type 2, in pregnancy, unspecified trimester: Secondary | ICD-10-CM

## 2017-06-19 DIAGNOSIS — O24113 Pre-existing diabetes mellitus, type 2, in pregnancy, third trimester: Secondary | ICD-10-CM

## 2017-06-19 DIAGNOSIS — O163 Unspecified maternal hypertension, third trimester: Secondary | ICD-10-CM

## 2017-06-19 NOTE — Progress Notes (Signed)
Pt denies H/A or visual disturbances.  

## 2017-06-22 ENCOUNTER — Encounter (HOSPITAL_COMMUNITY): Payer: Self-pay | Admitting: *Deleted

## 2017-06-22 ENCOUNTER — Telehealth (HOSPITAL_COMMUNITY): Payer: Self-pay | Admitting: *Deleted

## 2017-06-22 NOTE — Telephone Encounter (Signed)
Preadmission screen  

## 2017-06-23 ENCOUNTER — Other Ambulatory Visit: Payer: Self-pay | Admitting: Advanced Practice Midwife

## 2017-06-23 ENCOUNTER — Ambulatory Visit: Payer: Self-pay

## 2017-06-23 ENCOUNTER — Ambulatory Visit: Payer: 59 | Admitting: *Deleted

## 2017-06-23 ENCOUNTER — Ambulatory Visit (INDEPENDENT_AMBULATORY_CARE_PROVIDER_SITE_OTHER): Payer: 59 | Admitting: Family Medicine

## 2017-06-23 VITALS — BP 136/80 | HR 102 | Temp 98.8°F | Wt 157.0 lb

## 2017-06-23 DIAGNOSIS — B3731 Acute candidiasis of vulva and vagina: Secondary | ICD-10-CM

## 2017-06-23 DIAGNOSIS — B373 Candidiasis of vulva and vagina: Secondary | ICD-10-CM

## 2017-06-23 DIAGNOSIS — O0990 Supervision of high risk pregnancy, unspecified, unspecified trimester: Secondary | ICD-10-CM

## 2017-06-23 DIAGNOSIS — O24119 Pre-existing diabetes mellitus, type 2, in pregnancy, unspecified trimester: Secondary | ICD-10-CM

## 2017-06-23 LAB — POCT URINALYSIS DIP (DEVICE)
Bilirubin Urine: NEGATIVE
Glucose, UA: NEGATIVE mg/dL
Ketones, ur: NEGATIVE mg/dL
NITRITE: NEGATIVE
Protein, ur: NEGATIVE mg/dL
Specific Gravity, Urine: 1.01 (ref 1.005–1.030)
UROBILINOGEN UA: 0.2 mg/dL (ref 0.0–1.0)
pH: 6.5 (ref 5.0–8.0)

## 2017-06-23 MED ORDER — TERCONAZOLE 0.8 % VA CREA: 1.0000 | TOPICAL_CREAM | Freq: Every day | VAGINAL | 0 refills | Status: DC

## 2017-06-23 NOTE — Progress Notes (Signed)

## 2017-06-23 NOTE — Patient Instructions (Signed)

## 2017-06-23 NOTE — Progress Notes (Signed)
   PRENATAL VISIT NOTE  Subjective:  Cheyenne Lozano is a [age] y.o. G2P0010 at 3948w1d being seen today for ongoing prenatal care.  She is currently monitored for the following issues for this high-risk pregnancy and has Type 2 diabetes mellitus affecting pregnancy, antepartum; Supervision of high risk pregnancy, antepartum; Anemia of pregnancy, unspecified trimester; and Group B Streptococcus carrier, +RV culture, currently pregnant on their problem list.  Patient reports vaginal irritation and cold and flu-like symptoms. Has had a few elevated BP's with normal labs last week. .  Contractions: Irregular. Vag. Bleeding: None.  Movement: Present. Denies leaking of fluid.   The following portions of the patient's history were reviewed and updated as appropriate: allergies, current medications, past family history, past medical history, past social history, past surgical history and problem list. Problem list updated.  Objective:   Vitals:   06/23/17 1024 06/23/17 1131  BP: (!) 141/90 136/80  Pulse: (!) 102   Temp: 98.8 F (37.1 C)   Weight: 157 lb (71.2 kg)     Fetal Status: Fetal Heart Rate (bpm): NST   Movement: Present   Urine Prot neg Urine Gluc neg  General:  Alert, oriented and cooperative. Patient is in no acute distress.  Skin: Skin is warm and dry. No rash noted.   Cardiovascular: Normal heart rate noted  Respiratory: Normal respiratory effort, no problems with respiration noted  Abdomen: Soft, gravid, appropriate for gestational age.  Pain/Pressure: Present     Pelvic: Cervical exam deferred        Extremities: Normal range of motion.  Edema: Trace  Mental Status:  Normal mood and affect. Normal behavior. Normal judgment and thought content.   FBS 60-74 2 our pp 98-125 (1 out of range) NST:  Baseline: 135 bpm, Variability: Good {> 6 bpm), Accelerations: Reactive and Decelerations: Absent BPP is 8/8  Assessment and Plan:  Pregnancy: G2P0010 at 3148w1d  1. Supervision of high  risk pregnancy, antepartum   2. Type 2 diabetes mellitus affecting pregnancy, antepartum Continue metformin for IOL at 39 wks Pre-eclampsia precautions reviewed  3. Yeast vaginitis Treat presumptively - terconazole (TERAZOL 3) 0.8 % vaginal cream; Place 1 applicator vaginally at bedtime.  Dispense: 20 g; Refill: 0  5. Viral URI Supportive care.  Preterm labor symptoms and general obstetric precautions including but not limited to vaginal bleeding, contractions, leaking of fluid and fetal movement were reviewed in detail with the patient. Please refer to After Visit Summary for other counseling recommendations.  Return in 1 week (on 06/30/2017).   Reva Boresanya S Aaryn Parrilla, MD

## 2017-06-23 NOTE — Progress Notes (Signed)
Pt reports flu-like sx; sore throat, sneezing, body aches, cough, nasal congestion, dizziness and lack of appetite. She also feels she is getting a vaginal yeast infection.  She denies H/A or blurry vision.  IOL scheduled on 1/10.

## 2017-06-25 ENCOUNTER — Other Ambulatory Visit: Payer: Self-pay | Admitting: Obstetrics and Gynecology

## 2017-06-26 ENCOUNTER — Encounter (HOSPITAL_COMMUNITY): Payer: Self-pay

## 2017-06-26 ENCOUNTER — Other Ambulatory Visit: Payer: Self-pay

## 2017-06-26 ENCOUNTER — Inpatient Hospital Stay (HOSPITAL_COMMUNITY)
Admission: AD | Admit: 2017-06-26 | Discharge: 2017-07-01 | DRG: 786 | Disposition: A | Discharge status: Home / Self Care | Payer: 59 | Source: Ambulatory Visit | Attending: Obstetrics and Gynecology | Admitting: Obstetrics and Gynecology

## 2017-06-26 DIAGNOSIS — O9902 Anemia complicating childbirth: Secondary | ICD-10-CM | POA: Diagnosis present

## 2017-06-26 DIAGNOSIS — Z3A38 38 weeks gestation of pregnancy: Secondary | ICD-10-CM

## 2017-06-26 DIAGNOSIS — O99824 Streptococcus B carrier state complicating childbirth: Secondary | ICD-10-CM | POA: Diagnosis present

## 2017-06-26 DIAGNOSIS — O2412 Pre-existing diabetes mellitus, type 2, in childbirth: Secondary | ICD-10-CM | POA: Diagnosis present

## 2017-06-26 DIAGNOSIS — O411230 Chorioamnionitis, third trimester, not applicable or unspecified: Secondary | ICD-10-CM | POA: Diagnosis present

## 2017-06-26 DIAGNOSIS — O134 Gestational [pregnancy-induced] hypertension without significant proteinuria, complicating childbirth: Principal | ICD-10-CM | POA: Diagnosis present

## 2017-06-26 DIAGNOSIS — O479 False labor, unspecified: Secondary | ICD-10-CM

## 2017-06-26 DIAGNOSIS — E119 Type 2 diabetes mellitus without complications: Secondary | ICD-10-CM | POA: Diagnosis present

## 2017-06-26 DIAGNOSIS — Z7984 Long term (current) use of oral hypoglycemic drugs: Secondary | ICD-10-CM

## 2017-06-26 DIAGNOSIS — O139 Gestational [pregnancy-induced] hypertension without significant proteinuria, unspecified trimester: Secondary | ICD-10-CM | POA: Diagnosis present

## 2017-06-26 DIAGNOSIS — D649 Anemia, unspecified: Secondary | ICD-10-CM | POA: Diagnosis present

## 2017-06-26 DIAGNOSIS — O1414 Severe pre-eclampsia complicating childbirth: Secondary | ICD-10-CM | POA: Diagnosis not present

## 2017-06-26 DIAGNOSIS — O133 Gestational [pregnancy-induced] hypertension without significant proteinuria, third trimester: Secondary | ICD-10-CM | POA: Diagnosis not present

## 2017-06-26 DIAGNOSIS — O99019 Anemia complicating pregnancy, unspecified trimester: Secondary | ICD-10-CM

## 2017-06-26 DIAGNOSIS — O24419 Gestational diabetes mellitus in pregnancy, unspecified control: Secondary | ICD-10-CM | POA: Diagnosis not present

## 2017-06-26 HISTORY — DX: Essential (primary) hypertension: I10

## 2017-06-26 LAB — CBC
HCT: 29 % — ABNORMAL LOW (ref 36.0–46.0)
HEMOGLOBIN: 9.3 g/dL — AB (ref 12.0–15.0)
MCH: 26.6 pg (ref 26.0–34.0)
MCHC: 32.1 g/dL (ref 30.0–36.0)
MCV: 83.1 fL (ref 78.0–100.0)
Platelets: 280 10*3/uL (ref 150–400)
RBC: 3.49 MIL/uL — ABNORMAL LOW (ref 3.87–5.11)
RDW: 14.3 % (ref 11.5–15.5)
WBC: 11 10*3/uL — ABNORMAL HIGH (ref 4.0–10.5)

## 2017-06-26 LAB — COMPREHENSIVE METABOLIC PANEL
ALBUMIN: 2.9 g/dL — AB (ref 3.5–5.0)
ALK PHOS: 98 U/L (ref 38–126)
ALT: 11 U/L — ABNORMAL LOW (ref 14–54)
ANION GAP: 11 (ref 5–15)
AST: 19 U/L (ref 15–41)
CALCIUM: 8.4 mg/dL — AB (ref 8.9–10.3)
CO2: 21 mmol/L — ABNORMAL LOW (ref 22–32)
Chloride: 105 mmol/L (ref 101–111)
Creatinine, Ser: 0.81 mg/dL (ref 0.44–1.00)
GFR calc Af Amer: >60 mL/min (ref >60)
GLUCOSE: 78 mg/dL (ref 65–99)
Potassium: 3.6 mmol/L (ref 3.5–5.1)
Sodium: 137 mmol/L (ref 135–145)
TOTAL PROTEIN: 6.4 g/dL — AB (ref 6.5–8.1)
Total Bilirubin: 0.7 mg/dL (ref 0.3–1.2)

## 2017-06-26 LAB — TYPE AND SCREEN
ABO/RH(D): O POS
Antibody Screen: NEGATIVE

## 2017-06-26 LAB — ABO/RH: ABO/RH(D): O POS

## 2017-06-26 MED ORDER — OXYCODONE-ACETAMINOPHEN 5-325 MG PO TABS: 2.0000 | ORAL_TABLET | ORAL | Status: DC | PRN

## 2017-06-26 MED ORDER — LACTATED RINGERS IV SOLN
INTRAVENOUS | Status: DC
  Administered 2017-06-26 – 2017-06-28 (×8): via INTRAVENOUS

## 2017-06-26 MED ORDER — PENICILLIN G POT IN DEXTROSE 60000 UNIT/ML IV SOLN
3.0000 10*6.[IU] | INTRAVENOUS | Status: DC
  Administered 2017-06-27 (×6): 3 10*6.[IU] via INTRAVENOUS
  Filled 2017-06-26 (×7): qty 50

## 2017-06-26 MED ORDER — OXYTOCIN BOLUS FROM INFUSION: 500.0000 mL | Freq: Once | INTRAVENOUS | Status: DC

## 2017-06-26 MED ORDER — OXYCODONE-ACETAMINOPHEN 5-325 MG PO TABS: 1.0000 | ORAL_TABLET | ORAL | Status: DC | PRN

## 2017-06-26 MED ORDER — LIDOCAINE HCL (PF) 1 % IJ SOLN: 30.0000 mL | INTRAMUSCULAR | Status: DC | PRN

## 2017-06-26 MED ORDER — LACTATED RINGERS IV SOLN
500.0000 mL | INTRAVENOUS | Status: DC | PRN
  Administered 2017-06-27 – 2017-06-28 (×6): 500 mL via INTRAVENOUS

## 2017-06-26 MED ORDER — MISOPROSTOL 50MCG HALF TABLET
50.0000 ug | ORAL_TABLET | ORAL | Status: DC | PRN
  Administered 2017-06-26 – 2017-06-27 (×2): 50 ug via BUCCAL
  Filled 2017-06-26 (×2): qty 1

## 2017-06-26 MED ORDER — SOD CITRATE-CITRIC ACID 500-334 MG/5ML PO SOLN
30.0000 mL | ORAL | Status: DC | PRN
  Administered 2017-06-28: 30 mL via ORAL
  Filled 2017-06-26: qty 15

## 2017-06-26 MED ORDER — PENICILLIN G POTASSIUM 5000000 UNITS IJ SOLR
5.0000 10*6.[IU] | Freq: Once | INTRAVENOUS | Status: AC
  Administered 2017-06-26: 5 10*6.[IU] via INTRAVENOUS
  Filled 2017-06-26: qty 5

## 2017-06-26 MED ORDER — ONDANSETRON HCL 4 MG/2ML IJ SOLN: 4.0000 mg | Freq: Four times a day (QID) | INTRAMUSCULAR | Status: DC | PRN

## 2017-06-26 MED ORDER — OXYTOCIN 40 UNITS IN LACTATED RINGERS INFUSION - SIMPLE MED: 2.5000 [IU]/h | INTRAVENOUS | Status: DC

## 2017-06-26 MED ORDER — ACETAMINOPHEN 325 MG PO TABS
650.0000 mg | ORAL_TABLET | ORAL | Status: DC | PRN
  Administered 2017-06-27: 650 mg via ORAL
  Filled 2017-06-26: qty 2

## 2017-06-26 NOTE — MAU Provider Note (Signed)
Chief Complaint:  Contractions   Seen at 1635   HPI: Cheyenne Lozano is a 27 y.o. G2P0010 at 13w4dho presents to maternity admissions reporting uterine contractions. She was being evaluated by RN for labor when elevated BPs were noted.  Has had some elevated BPs in office visits.. She reports good fetal movement, denies LOF, vaginal bleeding, vaginal itching/burning, urinary symptoms, h/a, dizziness, n/v, diarrhea, constipation or fever/chills.  She denies headache, visual changes or RUQ abdominal pain.  Hypertension  This is a recurrent problem. The problem is unchanged. Associated symptoms include peripheral edema. Pertinent negatives include no blurred vision, headaches, malaise/fatigue or palpitations. Past treatments include nothing. There are no compliance problems.    RN Note: Having regular contractions, 7-880m apart. No bleeding or leaking.  No recent exams    Past Medical History: Past Medical History:  Diagnosis Date  . Breast hypertrophy in female 02/26/2015  . [place]   "actually I'm prediabetic; dr. put me on RX to get A1C #'s down" (02/26/2015)    Past obstetric history: OB History  Gravida Para Term Preterm AB Living  2       1    SAB TAB Ectopic Multiple Live Births  1            # Outcome Date GA Lbr Len/2nd Weight Sex Delivery Anes PTL Lv  2 Current           1 SAB 08/2016              Past Surgical History: Past Surgical History:  Procedure Laterality Date  . BREAST REDUCTION SURGERY Bilateral 02/26/2015   Procedure: BILATERAL BREAST REDUCTION  ;  Surgeon: DaCrissie ReeseMD;  [place] [facility] [place];  Laterality: Bilateral;  . REDUCTION MAMMAPLASTY Bilateral 02/26/2015  . WISDOM TOOTH EXTRACTION  ~ 2010   "bottom 2"    Family History: Family History  Problem Relation Age of Onset  . Diabetes Father   . Hypertension Father   . Diabetes Maternal Grandmother   . Hypertension Maternal Grandmother   . Diabetes Maternal  Grandfather   . Hypertension Maternal Grandfather     Social History: Social History   Tobacco Use  . Smoking status: Never Smoker  . Smokeless tobacco: Never Used  Substance Use Topics  . Alcohol use: Yes    Comment: not since pregnancy  . Drug use: No    Allergies: No Known Allergies  Meds:  Medications Prior to Admission  Medication Sig Dispense Refill Last Dose  . docusate sodium (COLACE) 100 MG capsule Take 100 mg by mouth 2 (two) times daily.   06/26/2017 at Unknown time  . ferrous sulfate (FERROUSUL) 325 (65 FE) MG tablet Take 1 tablet (325 mg total) 2 (two) times daily by mouth. 60 tablet 1 06/26/2017 at Unknown time  . metFORMIN (GLUCOPHAGE) 500 MG tablet Take 1 tablet (500 mg total) by mouth 2 (two) times daily with a meal. 60 tablet 5 06/26/2017 at 0830  . ondansetron (ZOFRAN-ODT) 4 MG disintegrating tablet Take 1 tablet (4 mg total) by mouth every 8 (eight) hours as needed for nausea or vomiting. 20 tablet 3 06/25/2017 at Unknown time  . Prenatal Vit-Fe Fumarate-FA (PRENATAL MULTIVITAMIN) TABS tablet Take 1 tablet by mouth daily at 12 noon.   06/25/2017 at Unknown time  . ACCU-CHEK FASTCLIX LANCETS MISC 1 Device by Percutaneous route 4 (four) times daily. 100 each 12 Taking  . Blood Glucose Monitoring Suppl (ACCU-CHEK NANO  SMARTVIEW) w/Device KIT 1 Device by Does not apply route daily. 1 kit 0 Taking  . glucose blood test strip Use as instructed 100 each 12 Taking  . terconazole (TERAZOL 3) 0.8 % vaginal cream Place 1 applicator vaginally at bedtime. (Patient not taking: Reported on 06/26/2017) 20 g 0 Not Taking at Unknown time    I have reviewed patient's Past Medical Hx, Surgical Hx, Family Hx, Social Hx, medications and allergies.   ROS:  Review of Systems  Constitutional: Negative for malaise/fatigue.  Eyes: Negative for blurred vision.  Cardiovascular: Negative for palpitations.  Neurological: Negative for headaches.   Other systems negative  Physical Exam   Patient  Vitals for the past 24 hrs:  BP Temp Temp src Pulse Resp SpO2 Weight  06/26/17 1630 (!) 136/100 - - 87 - - -  06/26/17 1629 (!) 138/101 - - 80 - - -  06/26/17 1628 - - - - 16 - -  06/26/17 1530 136/88 98.6 F (37 C) Oral 85 17 100 % 159 lb (72.1 kg)   Vitals:   06/26/17 1718 06/26/17 1730 06/26/17 1745 06/26/17 1800  BP: (!) 139/93 (!) 139/99 140/90 (!) 137/93  Pulse: 85 94 73 87  Resp:      Temp:      TempSrc:      SpO2:      Weight:        Constitutional: Well-developed, well-nourished female in no acute distress.  Cardiovascular: normal rate and rhythm Respiratory: normal effort, clear to auscultation bilaterally GI: Abd soft, non-tender, gravid appropriate for gestational age.   No rebound or guarding. MS: Extremities nontender, Trace edema, normal ROM Neurologic: Alert and oriented x 4.   DTRs 2+, no clonus GU: Neg CVAT.  PELVIC EXAM: Dilation: Closed Effacement (%): 50 Cervical Position: Posterior Station: Ballotable Exam by:: jolynn  FHT:  Baseline 135 , moderate variability, accelerations present, no decelerations Contractions: Irregular    Labs:  O/Positive/-- (07/16 0000) Results for orders placed or performed during the hospital encounter of 06/26/17 (from the past 24 hour(s))  CBC     Status: Abnormal   Collection Time: 06/26/17  4:44 PM  Result Value Ref Range   WBC 11.0 (H) 4.0 - 10.5 K/uL   RBC 3.49 (L) 3.87 - 5.11 MIL/uL   Hemoglobin 9.3 (L) 12.0 - 15.0 g/dL   HCT 29.0 (L) 36.0 - 46.0 %   MCV 83.1 78.0 - 100.0 fL   MCH 26.6 26.0 - 34.0 pg   MCHC 32.1 30.0 - 36.0 g/dL   RDW 14.3 11.5 - 15.5 %   Platelets 280 150 - 400 K/uL  Comprehensive metabolic panel     Status: Abnormal   Collection Time: 06/26/17  4:44 PM  Result Value Ref Range   Sodium 137 135 - 145 mmol/L   Potassium 3.6 3.5 - 5.1 mmol/L   Chloride 105 101 - 111 mmol/L   CO2 21 (L) 22 - 32 mmol/L   Glucose, Bld 78 65 - 99 mg/dL   BUN <5 (L) 6 - 20 mg/dL   Creatinine, Ser 0.81 0.44 -  1.00 mg/dL   Calcium 8.4 (L) 8.9 - 10.3 mg/dL   Total Protein 6.4 (L) 6.5 - 8.1 g/dL   Albumin 2.9 (L) 3.5 - 5.0 g/dL   AST 19 15 - 41 U/L   ALT 11 (L) 14 - 54 U/L   Alkaline Phosphatase 98 38 - 126 U/L   Total Bilirubin 0.7 0.3 - 1.2 mg/dL  GFR calc non Af Amer >60 >60 mL/min   GFR calc Af Amer >60 >60 mL/min   Anion gap 11 5 - 15  Protein / creatinine ratio, urine     Status: None   Collection Time: 06/26/17  4:59 PM  Result Value Ref Range   Creatinine, Urine 36.00 mg/dL   Total Protein, Urine <6 mg/dL   Protein Creatinine Ratio        0.00 - 0.15 mg/mg[Cre]    Imaging:    MAU Course/MDM: I have ordered labs and reviewed results. There was a delay in results secondary to misplacement of lab vials NST reviewed and found reactive Consult Dr Rosana Hoes with presentation, exam findings and test results. She is scheduled for IOL this Thursday.  Dr Rosana Hoes wants to get labs before deciding if patient needs to be induced earlier for gestational hypertension. Treatments in MAU included EFM.    Reconsulted Dr Ilda Basset  WIll admit for Gestational hypertension  Assessment: Single IUP at 28w4dGestational Hypertension Patient Active Problem List   Diagnosis Date Noted  . Group B Streptococcus carrier, +RV culture, currently pregnant 06/16/2017  . Anemia of pregnancy, unspecified trimester 04/15/2017  . Type 2 diabetes mellitus affecting pregnancy, antepartum 01/11/2017  . Supervision of high risk pregnancy, antepartum 01/11/2017    Plan: Admit for induction of labor MD to follow   MHansel FeinsteinCNM, MSN Certified Nurse-Midwife 06/26/2017 4:35 PM

## 2017-06-26 NOTE — Anesthesia Pain Management Evaluation Note (Signed)
  CRNA Pain Management Visit Note  Patient: Cheyenne Lozano, 27 y.o., female  "Hello [facility] am [facility]. We have an anesthesia team available at all times to provide care throughout the hospital, including epidural management and anesthesia for C-section. I don't know your plan for the delivery whether it a natural birth, water birth, IV sedation, nitrous supplementation, doula or epidural, but we want to meet your pain goals."   1.Was your pain managed to your expectations on prior hospitalizations?   No prior hospitalizations  2.What is your expectation for pain management during this hospitalization?     Epidural  3.How can we help you reach that goal? Epidural when desired  Record the patient's initial score and the patient's pain goal.   Pain: 0  Pain Goal: 5 [facility] wants you to be able to say your pain was always managed very well.  Cheyenne Lozano 06/26/2017

## 2017-06-26 NOTE — MAU Note (Signed)
Called lab to find out why blood had not been processed and they said it had not be received. Phlebotomy had drawn patient over an hour ago and sent it to lab. Blood was just found on another unit at their tube station and sent to lab. Will be processed now.

## 2017-06-26 NOTE — MAU Note (Signed)
Having regular contractions, 7-238min apart. No bleeding or leaking.  No recent exams

## 2017-06-26 NOTE — H&P (Signed)
OBSTETRIC ADMISSION HISTORY AND PHYSICAL  Cheyenne Lozano is a 27 y.o. female G42P0010 with IUP at 75w4dby LMP presenting for IOL due to gestational HTN. Patient with PMH significant for T2DM for which she was also going to be induced in 3 days. Regular contractions beginning around 1400. She reports +FMs, No LOF, no VB, no blurry vision, headaches or peripheral edema, and RUQ pain.  She plans on breastfeeding. She request progesterone OCP for birth control. [facility]  Dating: By LMP --->  Estimated Date of Delivery: 07/06/17   Sono:   @[redacted]w[redacted]d , CWD, normal anatomy, vertex presentation,  1233g, 60% EFW   [facility]Com[facility] Active Problem List   Diagnosis Date Noted  . Gestational hypertension 06/26/2017  . Group B Streptococcus carrier, +RV culture, currently pregnant 06/16/2017  . Anemia of pregnancy, unspecified trimester 04/15/2017  . Type 2 diabetes mellitus affecting pregnancy, antepartum 01/11/2017  . Supervision of high risk pregnancy, antepartum 01/11/2017    Past Medical History: Past Medical History:  Diagnosis Date  . Breast hypertrophy in female 02/26/2015  . [place]    "actually I'm prediabetic; dr. put me on RX to get A1C #'s down" (02/26/2015)    Past Surgical History: Past Surgical History:  Procedure Laterality Date  . BREAST REDUCTION SURGERY Bilateral 02/26/2015   Procedure: BILATERAL BREAST REDUCTION  ;  Surgeon: DCrissie Reese MD;  [place]  Service: Plastics;  Laterality: Bilateral;  . REDUCTION MAMMAPLASTY Bilateral 02/26/2015  . WISDOM TOOTH EXTRACTION  ~ 2010   "bottom 2"    Obstetrical History: OB History    Gravida Para Term Preterm AB Living   2       1     SAB TAB Ectopic Multiple Live Births   1              Social History: Social History   Socioeconomic History  . Marital status: Single    Spouse name: None  .  Number of children: None  . Years of education: None  . Highest education level: None  Social Needs  . Financial resource strain: None  . Food insecurity - worry: None  . Food insecurity - inability: None  . Transportation needs - medical: None  . Transportation needs - non-medical: None  Occupational History  . None  Tobacco Use  . Smoking status: Never Smoker  . Smokeless tobacco: Never Used  Substance and Sexual Activity  . Alcohol use: Yes    Comment: not since pregnancy  . Drug use: No  . Sexual activity: No  Other Topics Concern  . None  Social History Narrative  . None    Family History: Family History  Problem Relation Age of Onset  . Diabetes Father   . Hypertension Father   . Diabetes Maternal Grandmother   . Hypertension Maternal Grandmother   . Diabetes Maternal Grandfather   . Hypertension Maternal Grandfather     Allergies: No Known Allergies  Medications Prior to Admission  Medication Sig Dispense Refill Last Dose  . docusate sodium (COLACE) 100 MG capsule Take 100 mg by mouth 2 (two) times daily.   06/26/2017 at Unknown time  . ferrous sulfate (FERROUSUL) 325 (65 FE) MG tablet Take 1 tablet (325 mg total) 2 (two) times daily by mouth. 60 tablet 1 06/26/2017 at Unknown time  . metFORMIN (GLUCOPHAGE) 500 MG tablet Take 1 tablet (500  mg total) by mouth 2 (two) times daily with a meal. 60 tablet 5 06/26/2017 at 0830  . ondansetron (ZOFRAN-ODT) 4 MG disintegrating tablet Take 1 tablet (4 mg total) by mouth every 8 (eight) hours as needed for nausea or vomiting. 20 tablet 3 06/25/2017 at Unknown time  . Prenatal Vit-Fe Fumarate-FA (PRENATAL MULTIVITAMIN) TABS tablet Take 1 tablet by mouth daily at 12 noon.   06/25/2017 at Unknown time  . ACCU-CHEK FASTCLIX LANCETS MISC 1 Device by Percutaneous route 4 (four) times daily. 100 each 12 Taking  . Blood Glucose Monitoring Suppl (ACCU-CHEK NANO SMARTVIEW) w/Device KIT 1 Device by Does not apply route daily. 1 kit 0 Taking  .  glucose blood test strip Use as instructed 100 each 12 Taking  . terconazole (TERAZOL 3) 0.8 % vaginal cream Place 1 applicator vaginally at bedtime. (Patient not taking: Reported on 06/26/2017) 20 g 0 Not Taking at Unknown time     Review of Systems   All systems reviewed and negative except as stated in HPI  Blood pressure (!) 149/96, pulse 86, temperature 98.6 F (37 C), temperature source Oral, resp. rate 16, weight 72.1 kg (159 lb), last menstrual period 09/29/2016, SpO2 100 %. General appearance: alert, cooperative, appears stated age and no distress Lungs: clear to auscultation bilaterally Heart: regular rate and rhythm, III/VI systolic murmur loudest in aortic region Abdomen: soft, non-tender; bowel sounds normal Extremities: Homans sign is negative, no sign of DVT DTR's normal Presentation: cephalic Fetal monitoringBaseline: 125 bpm, Variability: Good {> 6 bpm), Accelerations: Reactive, Decelerations: Absent Uterine activity: irritability Dilation: Closed Effacement (%): 50 Station: Ballotable Exam by:: jolynn   Prenatal labs: ABO, Rh: O/Positive/-- (07/16 0000) Antibody: Positive (07/16 0000) Rubella: Immune (07/16 0000) RPR: Non Reactive (10/25 1011)  HBsAg: Negative (07/16 0000)  HIV: Non Reactive (10/25 1011)  GBS: Positive (12/21 0918)  1 hr Glucola: n/a, A1c 5.8  Genetic screening  AFP WNL Anatomy US R choroid plexus  Prenatal Transfer Tool  Maternal Diabetes: Yes:  Diabetes Type:  Pre-pregnancy on Metformin Genetic Screening: Normal Maternal Ultrasounds/Referrals: Abnormal:  Findings:   Other:R choroid plexus Fetal Ultrasounds or other Referrals:  Other: BPP 06/23/17 rec IOL at 39wks Maternal Substance Abuse:  No Significant Maternal Medications:  None Significant Maternal Lab Results: Lab values include: Group B Strep positive  Results for orders placed or performed during the hospital encounter of 06/26/17 (from the past 24 hour(s))  CBC   Collection  Time: 06/26/17  4:44 PM  Result Value Ref Range   WBC 11.0 (H) 4.0 - 10.5 K/uL   RBC 3.49 (L) 3.87 - 5.11 MIL/uL   Hemoglobin 9.3 (L) 12.0 - 15.0 g/dL   HCT 29.0 (L) 36.0 - 46.0 %   MCV 83.1 78.0 - 100.0 fL   MCH 26.6 26.0 - 34.0 pg   MCHC 32.1 30.0 - 36.0 g/dL   RDW 14.3 11.5 - 15.5 %   Platelets 280 150 - 400 K/uL  Comprehensive metabolic panel   Collection Time: 06/26/17  4:44 PM  Result Value Ref Range   Sodium 137 135 - 145 mmol/L   Potassium 3.6 3.5 - 5.1 mmol/L   Chloride 105 101 - 111 mmol/L   CO2 21 (L) 22 - 32 mmol/L   Glucose, Bld 78 65 - 99 mg/dL   BUN <5 (L) 6 - 20 mg/dL   Creatinine, Ser 0.81 0.44 - 1.00 mg/dL   Calcium 8.4 (L) 8.9 - 10.3 mg/dL   Total Protein 6.4 (L)  6.5 - 8.1 g/dL   Albumin 2.9 (L) 3.5 - 5.0 g/dL   AST 19 15 - 41 U/L   ALT 11 (L) 14 - 54 U/L   Alkaline Phosphatase 98 38 - 126 U/L   Total Bilirubin 0.7 0.3 - 1.2 mg/dL   GFR calc non Af Amer >60 >60 mL/min   GFR calc Af Amer >60 >60 mL/min   Anion gap 11 5 - 15  Protein / creatinine ratio, urine   Collection Time: 06/26/17  4:59 PM  Result Value Ref Range   Creatinine, Urine 36.00 mg/dL   Total Protein, Urine <6 mg/dL   Protein Creatinine Ratio        0.00 - 0.15 mg/mg[Cre]    Patient Active Problem List   Diagnosis Date Noted  . Gestational hypertension 06/26/2017  . Group B Streptococcus carrier, +RV culture, currently pregnant 06/16/2017  . Anemia of pregnancy, unspecified trimester 04/15/2017  . Type 2 diabetes mellitus affecting pregnancy, antepartum 01/11/2017  . Supervision of high risk pregnancy, antepartum 01/11/2017    Assessment/Plan:  Cheyenne Lozano is a 27 y.o. G2P0010 at 69w4dhere for IOL 2/2 gestational HTN. H/o T2DM on metformin,  #Labor: Not in labor. Induce from closed cervix with cytotoec. Place FB when able.  #GHTN: [place], Mildly elevated BPs but no severe range, asymptomatic #T2DM: CBGs q4 hrs while not in active labor #Pain: Controlled; plan for  epidural #FWB: Cat I  #ID: GBS+, plan for PCN coverage #MOF: breast  #MOC: progesterone OCP #Circ:  Inpatient if insurance will allow  PLarwance Rote MD  06/26/2017, 7:33 PM PGY-1  OB FELLOW HISTORY AND PHYSICAL ATTESTATION  I confirm that I have verified the information documented in the resident's note and that I have also personally reperformed the physical exam and all medical decision making activities. I agree with above documentation and have made edits as needed.   JLuiz Blare DO OB Fellow 06/26/2017, 9:39 PM

## 2017-06-27 ENCOUNTER — Encounter: Payer: Self-pay | Admitting: *Deleted

## 2017-06-27 ENCOUNTER — Inpatient Hospital Stay (HOSPITAL_COMMUNITY): Payer: 59 | Admitting: Anesthesiology

## 2017-06-27 LAB — CBC
HEMATOCRIT: 28.4 % — AB (ref 36.0–46.0)
HEMOGLOBIN: 9 g/dL — AB (ref 12.0–15.0)
MCH: 26.2 pg (ref 26.0–34.0)
MCHC: 31.7 g/dL (ref 30.0–36.0)
MCV: 82.8 fL (ref 78.0–100.0)
Platelets: 251 10*3/uL (ref 150–400)
RBC: 3.43 MIL/uL — AB (ref 3.87–5.11)
RDW: 14.1 % (ref 11.5–15.5)
WBC: 11.2 10*3/uL — AB (ref 4.0–10.5)

## 2017-06-27 LAB — GLUCOSE, CAPILLARY
GLUCOSE-CAPILLARY: 112 mg/dL — AB (ref 65–99)
GLUCOSE-CAPILLARY: 73 mg/dL (ref 65–99)
GLUCOSE-CAPILLARY: 70 mg/dL (ref 65–99)
GLUCOSE-CAPILLARY: 60 mg/dL — AB (ref 65–99)
GLUCOSE-CAPILLARY: 70 mg/dL (ref 65–99)
Glucose-Capillary: 71 mg/dL (ref 65–99)
Glucose-Capillary: 66 mg/dL (ref 65–99)
Glucose-Capillary: 75 mg/dL (ref 65–99)
Glucose-Capillary: 71 mg/dL (ref 65–99)
Glucose-Capillary: 87 mg/dL (ref 65–99)
Glucose-Capillary: 89 mg/dL (ref 65–99)

## 2017-06-27 LAB — RPR: RPR: NONREACTIVE

## 2017-06-27 LAB — PROTEIN / CREATININE RATIO, URINE
CREATININE, URINE: 36 mg/dL
Total Protein, Urine: <6 mg/dL

## 2017-06-27 MED ORDER — SODIUM CHLORIDE 0.9 % IV SOLN
2.0000 g | Freq: Four times a day (QID) | INTRAVENOUS | Status: DC
  Administered 2017-06-27 – 2017-06-28 (×3): 2 g via INTRAVENOUS
  Filled 2017-06-27 (×5): qty 2000

## 2017-06-27 MED ORDER — PHENYLEPHRINE 40 MCG/ML (10ML) SYRINGE FOR IV PUSH (FOR BLOOD PRESSURE SUPPORT): 80.0000 ug | PREFILLED_SYRINGE | INTRAVENOUS | Status: DC | PRN

## 2017-06-27 MED ORDER — PHENYLEPHRINE 40 MCG/ML (10ML) SYRINGE FOR IV PUSH (FOR BLOOD PRESSURE SUPPORT)
80.0000 ug | PREFILLED_SYRINGE | INTRAVENOUS | Status: DC | PRN
  Filled 2017-06-27: qty 10

## 2017-06-27 MED ORDER — FENTANYL 2.5 MCG/ML BUPIVACAINE 1/10 % EPIDURAL INFUSION (WH - ANES)
14.0000 mL/h | INTRAMUSCULAR | Status: DC | PRN
  Administered 2017-06-27 – 2017-06-28 (×4): 14 mL/h via EPIDURAL
  Filled 2017-06-27 (×4): qty 100

## 2017-06-27 MED ORDER — EPHEDRINE 5 MG/ML INJ: 10.0000 mg | INTRAVENOUS | Status: DC | PRN

## 2017-06-27 MED ORDER — GENTAMICIN SULFATE 40 MG/ML IJ SOLN
140.0000 mg | Freq: Two times a day (BID) | INTRAVENOUS | Status: DC
  Administered 2017-06-27 – 2017-06-28 (×2): 140 mg via INTRAVENOUS
  Filled 2017-06-27 (×3): qty 3.5

## 2017-06-27 MED ORDER — LIDOCAINE HCL (PF) 1 % IJ SOLN
INTRAMUSCULAR | Status: DC | PRN
  Administered 2017-06-27: 6 mL via EPIDURAL
  Administered 2017-06-27: 4 mL

## 2017-06-27 MED ORDER — OXYTOCIN 40 UNITS IN LACTATED RINGERS INFUSION - SIMPLE MED
1.0000 m[IU]/min | INTRAVENOUS | Status: DC
  Administered 2017-06-27 – 2017-06-28 (×2): 2 m[IU]/min via INTRAVENOUS
  Administered 2017-06-28: 8 m[IU]/min via INTRAVENOUS
  Administered 2017-06-28: 6 m[IU]/min via INTRAVENOUS
  Filled 2017-06-27: qty 1000

## 2017-06-27 MED ORDER — FLEET ENEMA 7-19 GM/118ML RE ENEM
1.0000 | ENEMA | Freq: Once | RECTAL | Status: AC
  Administered 2017-06-27: 1 via RECTAL

## 2017-06-27 MED ORDER — ZOLPIDEM TARTRATE 5 MG PO TABS
5.0000 mg | ORAL_TABLET | Freq: Every evening | ORAL | Status: DC | PRN
  Administered 2017-06-27: 5 mg via ORAL
  Filled 2017-06-27: qty 1

## 2017-06-27 MED ORDER — FENTANYL CITRATE (PF) 100 MCG/2ML IJ SOLN
100.0000 ug | INTRAMUSCULAR | Status: DC | PRN
  Administered 2017-06-27 (×4): 100 ug via INTRAVENOUS
  Administered 2017-06-28 (×2): 50 ug via INTRAVENOUS
  Filled 2017-06-27 (×4): qty 2

## 2017-06-27 MED ORDER — DIPHENHYDRAMINE HCL 50 MG/ML IJ SOLN: 12.5000 mg | INTRAMUSCULAR | Status: DC | PRN

## 2017-06-27 MED ORDER — LACTATED RINGERS IV SOLN
500.0000 mL | Freq: Once | INTRAVENOUS | Status: AC
  Administered 2017-06-27: 500 mL via INTRAVENOUS

## 2017-06-27 MED ORDER — TERBUTALINE SULFATE 1 MG/ML IJ SOLN: 0.2500 mg | Freq: Once | INTRAMUSCULAR | Status: DC | PRN

## 2017-06-27 NOTE — Progress Notes (Signed)
Pharmacy Antibiotic Note  Cheyenne Lozano is a 27 y.o. G2P0010 at 6188w5d admitted on 06/26/2017 for [facility]OL [facility]. Pt is in labor and progressing but now has an elevated temperature. Pharmacy has been consulted for gentamicin dosing for Triple I.  Plan: Gentamicin 140 mg IV every 12 hours Monitor serum creatinine per protocol Serum gentamicin levels as indicated by duration of therapy and clinical response.  Height: 4\' 11"  (149.9 cm) Weight: 159 lb (72.1 kg) IBW/kg (Calculated) : 43.2  Temp (24hrs), Avg:98.7 F (37.1 C), Min:97.8 F (36.6 C), Max:100.7 F (38.2 C)  Recent Labs  Lab 06/26/17 1644 06/27/17 1051  WBC 11.0* 11.2*  CREATININE 0.81  --     Estimated Creatinine Clearance: 92.6 mL/min (by C-G formula based on SCr of 0.81 mg/dL).    No Known Allergies  Antimicrobials this admission: Penicillin per positive GBS protocol   Dose adjustments this admission: N/A  Thank you for allowing pharmacy to be a part of this patient's care.  Arelia SneddonMason, Maurine Mowbray Anne 06/27/2017 10:54 PM

## 2017-06-27 NOTE — Anesthesia Procedure Notes (Signed)
Epidural Patient location during procedure: OB  Staffing Anesthesiologist: Sharlotte Baka, MD  Preanesthetic Checklist Completed: patient identified, pre-op evaluation, timeout performed, IV checked, risks and benefits discussed and monitors and equipment checked  Epidural Patient position: sitting Prep: DuraPrep Patient monitoring: blood pressure and continuous pulse ox Approach: right paramedian Location: L3-L4 Injection technique: LOR air  Needle:  Needle type: Tuohy  Needle gauge: 17 G Needle insertion depth: 5 cm Catheter type: closed end flexible Catheter size: 19 Gauge Catheter at skin depth: 10 cm Test dose: negative  Assessment Sensory level: T8  Additional Notes   Dosing of Epidural:  1st dose, through catheter .............................................  Xylocaine 40 mg  2nd dose, through catheter, after waiting 3 minutes.........Xylocaine 60 mg    As each dose occurred, patient was free of IV sx; and patient exhibited no evidence of SA injection.  Patient is more comfortable after epidural dosed. Please see RN's note for documentation of vital signs,and FHR which are stable.  Patient reminded not to try to ambulate with numb legs, and that an RN must be present when she attempts to get up.          

## 2017-06-27 NOTE — Progress Notes (Signed)
Hypoglycemic Event  CBG: 60  Treatment: 15 GM carbohydrate snack  Symptoms: None  Follow-up CBG: Time:0510 CBG Result:pending  Possible Reasons for Event: Other: labor; clear liquid diet  Comments/MD notified:Dr. Rushie GoltzLeland notifed at 1658     Jacinto ReapSarah K Aleen Marston

## 2017-06-27 NOTE — Progress Notes (Signed)
Labor Progress Note Enola A Mayford Knifeurner is a 27 y.o. G2P0010 at 7169w5d presented for IOL for GHTN. Pregnancy also complicated by T2DM.  S: Patient more comfortable with epidural in place. No complaints at this time. Having some n/v but declines medication at this time.   O:  BP (!) 144/101   Pulse (!) 117   Temp 99.3 F (37.4 C) (Oral)   Resp 18   Ht 4\' 11"  (1.499 m)   Wt 72.1 kg (159 lb)   LMP 09/29/2016   SpO2 100%   BMI 32.11 kg/m  EFM: 125/mod variability/+ accels, no decels  CVE: Dilation: 6 Effacement (%): 80 Cervical Position: Posterior Station: -1 Presentation: Vertex Exam by:: Kennyth ArnoldH Beitz RN    A&P: 27 y.o. G2P0010 5569w5d IOL for GHTN. Pregnancy also complicated by T2DM.  #Labor: Progressing well on pitocin. S/p FB, cytotec, and AROM. Continue pitocin with expectant management  # GHTN: BP's elevated but no severe range pressures # T2DM: CBGs have been at goal, continue to monitor q2h in labor #Pain: epidural #FWB: CAT I NST #GBS positive, PCN ppx  [name], DO OB Fellow

## 2017-06-27 NOTE — Progress Notes (Signed)
LABOR PROGRESS NOTE  Cheyenne Lozano is a 27 y.o. G2P0010 at 8859w5d  admitted for IOL for GHTN. Pregnancy also complicated by T2DM.  Subjective: Patient feeling uncomfortable, having frequent ctx. She also reports trying to have BM, but not being able to  Objective: BP 132/83   Pulse 85   Temp 97.8 F (36.6 C) (Oral)   Resp 20   Ht 4\' 11"  (1.499 m)   Wt 159 lb (72.1 kg)   LMP 09/29/2016   SpO2 100%   BMI 32.11 kg/m  or  Vitals:   06/27/17 0608 06/27/17 0700 06/27/17 0720 06/27/17 0823  BP: 131/87 130/81  132/83  Pulse: 85 97  85  Resp:   16 20  Temp:   97.8 F (36.6 C)   TempSrc:   Oral   SpO2:      Weight:      Height:        SVE Dilation: Fingertip Effacement (%): 50 Cervical Position: Posterior Station: -2 Presentation: Vertex Exam by:: Dr. Nira Retortegele FHT: baseline rate 125, moderate varibility, +acel, no decel Toco: Ctx q1-2 min  CBG (last 3)  Recent Labs    06/27/17 0136 06/27/17 0525 06/27/17 0919  GLUCAP 73 71 66     Assessment / Plan: 27 y.o. G2P0010 at 5259w5d here for IOL GHTN, pregnancy also c/b T2DM  Labor: s/p cytotec x2, last dose at 0130. Has been contracting regularly since. FB placed now. Fetal Wellbeing:  Cat I Pain Control:  Iv pain meds Anticipated MOD:  SVD  GHTN: BP have been normal to mild range T2DM: CBGs have been at goal, continue to monitor q4h; q1-2h when in active labor  Frederik PearJulie P Fread Kottke, MD 06/27/2017, 9:18 AM

## 2017-06-27 NOTE — Anesthesia Preprocedure Evaluation (Addendum)
Anesthesia Evaluation  Patient identified by MRN, date of birth, ID band Patient awake    Reviewed: Allergy & Precautions, H&P , Patient's Chart, lab work & pertinent test results  Airway Mallampati: II  TM Distance: >3 FB Neck ROM: full    Dental  (+) Teeth Intact   Pulmonary    breath sounds clear to auscultation       Cardiovascular hypertension,  Rhythm:regular Rate:Normal     Neuro/Psych    GI/Hepatic   Endo/Other  diabetes, Type 2  Renal/GU      Musculoskeletal   Abdominal   Peds  Hematology  (+) anemia ,   Anesthesia Other Findings       Reproductive/Obstetrics (+) Pregnancy                            Anesthesia Physical Anesthesia Plan  ASA: III  Anesthesia Plan: Epidural   Post-op Pain Management:    Induction:   PONV Risk Score and Plan:   Airway Management Planned:   Additional Equipment:   Intra-op Plan:   Post-operative Plan:   Informed Consent: I have reviewed the patients History and Physical, chart, labs and discussed the procedure including the risks, benefits and alternatives for the proposed anesthesia with the patient or authorized representative who has indicated his/her understanding and acceptance.   Dental Advisory Given  Plan Discussed with:   Anesthesia Plan Comments: (Labs checked- platelets confirmed with RN in room. Fetal heart tracing, per RN, reported to be stable enough for sitting procedure. Discussed epidural, and patient consents to the procedure:  included risk of possible headache,backache, failed block, allergic reaction, and nerve injury. This patient was asked if she had any questions or concerns before the procedure started.)        Anesthesia Quick Evaluation

## 2017-06-27 NOTE — Progress Notes (Signed)
Labor Progress Note Cheyenne Lozano is a 27 y.o. G2P0010 at 4964w5d presented for IOL for GHTN. Pregnancy also complicated by T2DM.  S: Patient more comfortable, frequent contractions but pain controlled with IV meds.   O:  BP 115/70   Pulse 71   Temp 98.6 F (37 C) (Oral)   Resp 16   Ht 4\' 11"  (1.499 m)   Wt 72.1 kg (159 lb)   LMP 09/29/2016   SpO2 100%   BMI 32.11 kg/m  EFM: HR 120/mod variability/+ accels, no decels Toco: Ctx Q574min  CVE: Dilation: Fingertip Effacement (%): 50 Cervical Position: Posterior Station: -2 Presentation: Vertex Exam by:: Dr. Nira Retortegele  Results for orders placed or performed during the hospital encounter of 06/26/17 (from the past 24 hour(s))  CBC     Status: Abnormal   Collection Time: 06/26/17  4:44 PM  Result Value Ref Range   WBC 11.0 (H) 4.0 - 10.5 K/uL   RBC 3.49 (L) 3.87 - 5.11 MIL/uL   Hemoglobin 9.3 (L) 12.0 - 15.0 g/dL   HCT 16.129.0 (L) 09.636.0 - 04.546.0 %   MCV 83.1 78.0 - 100.0 fL   MCH 26.6 26.0 - 34.0 pg   MCHC 32.1 30.0 - 36.0 g/dL   RDW 40.914.3 81.111.5 - 91.415.5 %   Platelets 280 150 - 400 K/uL  Comprehensive metabolic panel     Status: Abnormal   Collection Time: 06/26/17  4:44 PM  Result Value Ref Range   Sodium 137 135 - 145 mmol/L   Potassium 3.6 3.5 - 5.1 mmol/L   Chloride 105 101 - 111 mmol/L   CO2 21 (L) 22 - 32 mmol/L   Glucose, Bld 78 65 - 99 mg/dL   BUN <5 (L) 6 - 20 mg/dL   Creatinine, Ser 7.820.81 0.44 - 1.00 mg/dL   Calcium 8.4 (L) 8.9 - 10.3 mg/dL   Total Protein 6.4 (L) 6.5 - 8.1 g/dL   Albumin 2.9 (L) 3.5 - 5.0 g/dL   AST 19 15 - 41 U/L   ALT 11 (L) 14 - 54 U/L   Alkaline Phosphatase 98 38 - 126 U/L   Total Bilirubin 0.7 0.3 - 1.2 mg/dL   GFR calc non Af Amer >60 >60 mL/min   GFR calc Af Amer >60 >60 mL/min   Anion gap 11 5 - 15  Protein / creatinine ratio, urine     Status: None   Collection Time: 06/26/17  4:59 PM  Result Value Ref Range   Creatinine, Urine 36.00 mg/dL   Total Protein, Urine <6 mg/dL   Protein  Creatinine Ratio        0.00 - 0.15 mg/mg[Cre]  Type and screen [facility]     Status: None   Collection Time: 06/26/17  8:00 PM  Result Value Ref Range   ABO/RH(D) O POS    Antibody Screen NEG    Sample Expiration 06/29/2017   RPR     Status: None   Collection Time: 06/26/17  8:00 PM  Result Value Ref Range   RPR Ser Ql Non Reactive Non Reactive  ABO/Rh     Status: None   Collection Time: 06/26/17  8:00 PM  Result Value Ref Range   ABO/RH(D) O POS   Glucose, capillary     Status: Abnormal   Collection Time: 06/26/17 10:11 PM  Result Value Ref Range   Glucose-Capillary 112 (H) 65 - 99 mg/dL  Glucose, capillary     Status: None  Collection Time: 06/27/17  1:36 AM  Result Value Ref Range   Glucose-Capillary 73 65 - 99 mg/dL  Glucose, capillary     Status: None   Collection Time: 06/27/17  5:25 AM  Result Value Ref Range   Glucose-Capillary 71 65 - 99 mg/dL  Glucose, capillary     Status: None   Collection Time: 06/27/17  9:19 AM  Result Value Ref Range   Glucose-Capillary 66 65 - 99 mg/dL  Glucose, capillary     Status: None   Collection Time: 06/27/17  9:50 AM  Result Value Ref Range   Glucose-Capillary 70 65 - 99 mg/dL  CBC     Status: Abnormal   Collection Time: 06/27/17 10:51 AM  Result Value Ref Range   WBC 11.2 (H) 4.0 - 10.5 K/uL   RBC 3.43 (L) 3.87 - 5.11 MIL/uL   Hemoglobin 9.0 (L) 12.0 - 15.0 g/dL   HCT 16.1 (L) 09.6 - 04.5 %   MCV 82.8 78.0 - 100.0 fL   MCH 26.2 26.0 - 34.0 pg   MCHC 31.7 30.0 - 36.0 g/dL   RDW 40.9 81.1 - 91.4 %   Platelets 251 150 - 400 K/uL  Glucose, capillary     Status: None   Collection Time: 06/27/17 12:53 PM  Result Value Ref Range   Glucose-Capillary 75 65 - 99 mg/dL    A&P: 27 y.o. [id number] [redacted]w[redacted]d IOL for GHTN. Pregnancy also complicated by T2DM. #Labor: Progressing well. Foley balloon placed around 0900, looser per RN's exam. S/p cytotec x2, d/c'd for frequent ctx's. # GHTN: BP's within normal range  recently # T@DM : CBGs have been at goal, continue to monitor q4h; q1-2h when in active labor #Pain: IV pain meds #FWB: CAT I tracing #GBS positive: PCN  Alroy Bailiff, MD 12:56 PM

## 2017-06-27 NOTE — Progress Notes (Signed)
LABOR PROGRESS NOTE  Cheyenne Lozano is a 27 y.o. G2P0010 at 5025w5d  admitted for IOL for GHTN. Pregnancy also complicated by T2DM.  Subjective: Patient  Had been feeling strong contractions, pain uncontrolled with IV Fentanyl. Has now gotten epidural, and pain well-controlled.  Objective: BP 124/85   Pulse 89   Temp 98.4 F (36.9 C) (Oral)   Resp 16   Ht 4\' 11"  (1.499 m)   Wt 159 lb (72.1 kg)   LMP 09/29/2016   SpO2 100%   BMI 32.11 kg/m  or  Vitals:   06/27/17 1545 06/27/17 1550 06/27/17 1555 06/27/17 1600  BP: 128/84 129/90 (!) 135/97 124/85  Pulse: (!) 102 99 96 89  Resp: 16 16 16 16   Temp:      TempSrc:      SpO2: 99% 100% 99% 100%  Weight:      Height:        SVE Dilation: 4.5 Effacement (%): 70 Cervical Position: Posterior Station: -2 Presentation: Vertex Exam by:: DR. Nira Retortegele FHT: baseline rate 120, moderate varibility, 10x10 acel, no decel Toco: Ctx q 2-4 min  CBG (last 3)  Recent Labs    06/27/17 0919 06/27/17 0950 06/27/17 1253  GLUCAP 66 70 75     Assessment / Plan: 27 y.o. G2P0010 at 6525w5d here for IOL GHTN, pregnancy also c/b T2DM  Labor:FB out around 1545, AROM with clear fluid. Ctx regularly. Will recheck in 60-90 min, and start IV Pit if not making cervical change Fetal Wellbeing:  Cat I Pain Control:  epidural Anticipated MOD:  SVD  GHTN: BP have been normal to mild range T2DM: CBGs have been at goal, monitor q2h now  Frederik PearJulie P Camara Renstrom, MD 06/27/2017

## 2017-06-27 NOTE — Progress Notes (Addendum)
Hypoglycemic Event  CBG: 66  Treatment: 15 GM carbohydrate snack  Symptoms: None  Follow-up CBG: Time:0935 CBG Result: 70  Possible Reasons for Event: Other: Labor and clear liquid diet  Comments/MD notified: Dr. Nira Retortegele notified at [id number]    Cheyenne ReapSarah K Taniya Lozano

## 2017-06-27 NOTE — Progress Notes (Signed)

## 2017-06-27 NOTE — Progress Notes (Signed)
Labor Progress Note Cheyenne Lozano is a 27 y.o. G2P0010 at 5837w5d presented for IOL for GHTN. Pregnancy also complicated by T2DM.  S: Patient more comfortable with epidural in place. No complaints at this time.  O:  BP 126/80   Pulse 88   Temp 98.4 F (36.9 C) (Oral)   Resp 16   Ht 4\' 11"  (1.499 m)   Wt 72.1 kg (159 lb)   LMP 09/29/2016   SpO2 100%   BMI 32.11 kg/m  EFM: 125/mod variability/+ accels, no decels  CVE: Dilation: 4.5 Effacement (%): 70 Cervical Position: Posterior Station: -2 Presentation: Vertex Exam by:: DR. Nira Retortegele   A&P: 27 y.o. G2P0010 2737w5d IOL for GHTN. Pregnancy also complicated by T2DM.  #Labor: Progressing well. Foley balloon out around 1500. S/p cytotec x2. Will plan for pitocin gtt.  # GHTN: BP's within normal range  # T@DM : CBGs have been at goal, continue to monitor q4h; q1-2h when in active labor #Pain: epidural #FWB: CAT I NST #GBS positive, PCN ppx  Alroy BailiffParker W Senon Nixon, MD 5:51 PM

## 2017-06-27 NOTE — Progress Notes (Signed)
Cheyenne Lozano is a 27 y.o. G2P0010 at 5342w5d by LMP admitted for induction of labor due to  gHTN.  Subjective: Patient appears comfortable with no current issues.   She denies pain with contractions.  Has not been able to sleep much but otherwise feels well.   Objective: BP (!) 135/98   Pulse 78   Temp 98.4 F (36.9 C) (Oral)   Resp 18   Ht 4\' 11"  (1.499 m)   Wt 72.1 kg (159 lb)   LMP 09/29/2016   SpO2 100%   BMI 32.11 kg/m  No intake/output data recorded. No intake/output data recorded.  FHT:  FHR: 130  bpm, variability: moderate,  accelerations:  Present,  decelerations:  Absent UC:   irregular SVE:   Dilation: Closed Effacement (%): Thick Station: -2 Exam by:: dr Doroteo Glassmanphelps  Labs: Lab Results  Component Value Date   WBC 11.0 (H) 06/26/2017   HGB 9.3 (L) 06/26/2017   HCT 29.0 (L) 06/26/2017   MCV 83.1 06/26/2017   PLT 280 06/26/2017    Assessment / Plan: IOL due to gHTN and gDM. Minimal progress on Pitocin, cervix is closed and thick on repeat check.  Attempt FB when able. Will continue Cytotec for now.   Labor: Slow progress, continue Cytotec for now.  Fetal Wellbeing:  Category I Pain Control:  well controlled, plans to have epidural I/D:  GBS+, will give PCN Anticipated MOD:  NSVD  Cheyenne MarchYashika Kaelen Brennan, MD  06/27/2017, 1:44 AM

## 2017-06-28 ENCOUNTER — Encounter (HOSPITAL_COMMUNITY)
Admission: AD | Disposition: A | Discharge status: Home / Self Care | Payer: Self-pay | Source: Ambulatory Visit | Attending: Obstetrics and Gynecology

## 2017-06-28 ENCOUNTER — Encounter (HOSPITAL_COMMUNITY): Payer: Self-pay | Admitting: *Deleted

## 2017-06-28 DIAGNOSIS — Z3A38 38 weeks gestation of pregnancy: Secondary | ICD-10-CM

## 2017-06-28 DIAGNOSIS — O1414 Severe pre-eclampsia complicating childbirth: Secondary | ICD-10-CM

## 2017-06-28 DIAGNOSIS — E119 Type 2 diabetes mellitus without complications: Secondary | ICD-10-CM

## 2017-06-28 DIAGNOSIS — O2412 Pre-existing diabetes mellitus, type 2, in childbirth: Secondary | ICD-10-CM

## 2017-06-28 DIAGNOSIS — O99824 Streptococcus B carrier state complicating childbirth: Secondary | ICD-10-CM

## 2017-06-28 LAB — CBC
HEMATOCRIT: 26.9 % — AB (ref 36.0–46.0)
HEMATOCRIT: 27.8 % — AB (ref 36.0–46.0)
HEMOGLOBIN: 8.7 g/dL — AB (ref 12.0–15.0)
Hemoglobin: 9.1 g/dL — ABNORMAL LOW (ref 12.0–15.0)
MCH: 26.6 pg (ref 26.0–34.0)
MCH: 26.9 pg (ref 26.0–34.0)
MCHC: 32.3 g/dL (ref 30.0–36.0)
MCHC: 32.7 g/dL (ref 30.0–36.0)
MCV: 82.2 fL (ref 78.0–100.0)
MCV: 82.3 fL (ref 78.0–100.0)
Platelets: 238 10*3/uL (ref 150–400)
Platelets: 259 10*3/uL (ref 150–400)
RBC: 3.27 MIL/uL — ABNORMAL LOW (ref 3.87–5.11)
RBC: 3.38 MIL/uL — AB (ref 3.87–5.11)
RDW: 14.2 % (ref 11.5–15.5)
RDW: 14.3 % (ref 11.5–15.5)
WBC: 12.9 10*3/uL — ABNORMAL HIGH (ref 4.0–10.5)
WBC: 18.9 10*3/uL — ABNORMAL HIGH (ref 4.0–10.5)

## 2017-06-28 LAB — COMPREHENSIVE METABOLIC PANEL
ALBUMIN: 2.4 g/dL — AB (ref 3.5–5.0)
ALK PHOS: 98 U/L (ref 38–126)
ALT: 9 U/L — ABNORMAL LOW (ref 14–54)
ALT: 10 U/L — ABNORMAL LOW (ref 14–54)
ALT: 9 U/L — AB (ref 14–54)
ANION GAP: 11 (ref 5–15)
ANION GAP: 9 (ref 5–15)
AST: 19 U/L (ref 15–41)
AST: 20 U/L (ref 15–41)
AST: 23 U/L (ref 15–41)
Albumin: 2.3 g/dL — ABNORMAL LOW (ref 3.5–5.0)
Albumin: 2.1 g/dL — ABNORMAL LOW (ref 3.5–5.0)
Alkaline Phosphatase: 89 U/L (ref 38–126)
Alkaline Phosphatase: 96 U/L (ref 38–126)
Anion gap: 9 (ref 5–15)
BILIRUBIN TOTAL: 0.5 mg/dL (ref 0.3–1.2)
BILIRUBIN TOTAL: 0.8 mg/dL (ref 0.3–1.2)
BILIRUBIN TOTAL: 1.1 mg/dL (ref 0.3–1.2)
BUN: <5 mg/dL — ABNORMAL LOW (ref 6–20)
BUN: 5 mg/dL — ABNORMAL LOW (ref 6–20)
BUN: 6 mg/dL (ref 6–20)
CALCIUM: 8.1 mg/dL — AB (ref 8.9–10.3)
CO2: 20 mmol/L — ABNORMAL LOW (ref 22–32)
CO2: 20 mmol/L — ABNORMAL LOW (ref 22–32)
CO2: 22 mmol/L (ref 22–32)
CREATININE: 1.2 mg/dL — AB (ref 0.44–1.00)
Calcium: 8 mg/dL — ABNORMAL LOW (ref 8.9–10.3)
Calcium: 8 mg/dL — ABNORMAL LOW (ref 8.9–10.3)
Chloride: 102 mmol/L (ref 101–111)
Chloride: 106 mmol/L (ref 101–111)
Chloride: 103 mmol/L (ref 101–111)
Creatinine, Ser: 1.1 mg/dL — ABNORMAL HIGH (ref 0.44–1.00)
Creatinine, Ser: 1.13 mg/dL — ABNORMAL HIGH (ref 0.44–1.00)
GFR calc Af Amer: >60 mL/min (ref >60)
GFR calc Af Amer: >60 mL/min (ref >60)
GFR calc non Af Amer: >60 mL/min (ref >60)
GFR calc non Af Amer: >60 mL/min (ref >60)
GLUCOSE: 93 mg/dL (ref 65–99)
GLUCOSE: 99 mg/dL (ref 65–99)
Glucose, Bld: 84 mg/dL (ref 65–99)
POTASSIUM: 3.4 mmol/L — AB (ref 3.5–5.1)
POTASSIUM: 3.5 mmol/L (ref 3.5–5.1)
Potassium: 3.8 mmol/L (ref 3.5–5.1)
Sodium: 133 mmol/L — ABNORMAL LOW (ref 135–145)
Sodium: 135 mmol/L (ref 135–145)
Sodium: 134 mmol/L — ABNORMAL LOW (ref 135–145)
TOTAL PROTEIN: 5 g/dL — AB (ref 6.5–8.1)
TOTAL PROTEIN: 5.5 g/dL — AB (ref 6.5–8.1)
TOTAL PROTEIN: 5.6 g/dL — AB (ref 6.5–8.1)

## 2017-06-28 LAB — PROTEIN / CREATININE RATIO, URINE
Creatinine, Urine: 108 mg/dL
PROTEIN CREATININE RATIO: 0.99 mg/mg{creat} — AB (ref 0.00–0.15)
TOTAL PROTEIN, URINE: 107 mg/dL

## 2017-06-28 LAB — GLUCOSE, CAPILLARY
GLUCOSE-CAPILLARY: 80 mg/dL (ref 65–99)
GLUCOSE-CAPILLARY: 79 mg/dL (ref 65–99)
Glucose-Capillary: 131 mg/dL — ABNORMAL HIGH (ref 65–99)
Glucose-Capillary: 69 mg/dL (ref 65–99)
Glucose-Capillary: 80 mg/dL (ref 65–99)
Glucose-Capillary: 87 mg/dL (ref 65–99)
Glucose-Capillary: 93 mg/dL (ref 65–99)
Glucose-Capillary: 94 mg/dL (ref 65–99)

## 2017-06-28 SURGERY — Surgical Case
Anesthesia: Epidural

## 2017-06-28 MED ORDER — LIDOCAINE-EPINEPHRINE (PF) 2 %-1:200000 IJ SOLN
INTRAMUSCULAR | Status: AC
  Filled 2017-06-28: qty 20

## 2017-06-28 MED ORDER — ONDANSETRON HCL 4 MG/2ML IJ SOLN
INTRAMUSCULAR | Status: AC
  Filled 2017-06-28: qty 2

## 2017-06-28 MED ORDER — LIDOCAINE-EPINEPHRINE 2 %-1:100000 IJ SOLN
INTRAMUSCULAR | Status: DC | PRN
  Administered 2017-06-28: 2 mL via INTRADERMAL
  Administered 2017-06-28: 5 mL via INTRADERMAL
  Administered 2017-06-28: 10 mL
  Administered 2017-06-28: 3 mL via INTRADERMAL

## 2017-06-28 MED ORDER — FENTANYL CITRATE (PF) 100 MCG/2ML IJ SOLN: 25.0000 ug | INTRAMUSCULAR | Status: DC | PRN

## 2017-06-28 MED ORDER — LACTATED RINGERS IV SOLN
INTRAVENOUS | Status: DC
Start: 2017-06-28 — End: 2017-07-01
  Administered 2017-06-29: 03:00:00 via INTRAVENOUS

## 2017-06-28 MED ORDER — ONDANSETRON HCL 4 MG/2ML IJ SOLN
INTRAMUSCULAR | Status: DC | PRN
  Administered 2017-06-28: 4 mg via INTRAVENOUS

## 2017-06-28 MED ORDER — MAGNESIUM SULFATE 40 G IN LACTATED RINGERS - SIMPLE
1.0000 g/h | INTRAVENOUS | Status: AC
  Filled 2017-06-28: qty 500

## 2017-06-28 MED ORDER — OXYCODONE HCL 5 MG/5ML PO SOLN: 5.0000 mg | Freq: Once | ORAL | Status: DC | PRN

## 2017-06-28 MED ORDER — SODIUM CHLORIDE 0.9% FLUSH: 3.0000 mL | INTRAVENOUS | Status: DC | PRN

## 2017-06-28 MED ORDER — NALBUPHINE HCL 10 MG/ML IJ SOLN: 5.0000 mg | Freq: Once | INTRAMUSCULAR | Status: DC | PRN

## 2017-06-28 MED ORDER — OXYCODONE HCL 5 MG PO TABS: 5.0000 mg | ORAL_TABLET | Freq: Once | ORAL | Status: DC | PRN

## 2017-06-28 MED ORDER — MORPHINE SULFATE (PF) 0.5 MG/ML IJ SOLN
INTRAMUSCULAR | Status: DC | PRN
  Administered 2017-06-28 (×2): 1 mg via INTRAVENOUS

## 2017-06-28 MED ORDER — OXYCODONE-ACETAMINOPHEN 5-325 MG PO TABS
1.0000 | ORAL_TABLET | ORAL | Status: DC | PRN
  Filled 2017-06-28: qty 1

## 2017-06-28 MED ORDER — NALBUPHINE HCL 10 MG/ML IJ SOLN: 5.0000 mg | INTRAMUSCULAR | Status: DC | PRN

## 2017-06-28 MED ORDER — ZOLPIDEM TARTRATE 5 MG PO TABS: 5.0000 mg | ORAL_TABLET | Freq: Every evening | ORAL | Status: DC | PRN

## 2017-06-28 MED ORDER — MEPERIDINE HCL 25 MG/ML IJ SOLN
INTRAMUSCULAR | Status: AC
  Filled 2017-06-28: qty 1

## 2017-06-28 MED ORDER — HYDRALAZINE HCL 20 MG/ML IJ SOLN: 5.0000 mg | INTRAMUSCULAR | Status: DC | PRN

## 2017-06-28 MED ORDER — SODIUM BICARBONATE 8.4 % IV SOLN
INTRAVENOUS | Status: AC
  Filled 2017-06-28: qty 50

## 2017-06-28 MED ORDER — FENTANYL CITRATE (PF) 100 MCG/2ML IJ SOLN
INTRAMUSCULAR | Status: AC
  Filled 2017-06-28: qty 2

## 2017-06-28 MED ORDER — PHENYLEPHRINE 40 MCG/ML (10ML) SYRINGE FOR IV PUSH (FOR BLOOD PRESSURE SUPPORT)
PREFILLED_SYRINGE | INTRAVENOUS | Status: AC
  Filled 2017-06-28: qty 10

## 2017-06-28 MED ORDER — OXYTOCIN 10 UNIT/ML IJ SOLN
INTRAMUSCULAR | Status: AC
  Filled 2017-06-28: qty 4

## 2017-06-28 MED ORDER — MEPERIDINE HCL 25 MG/ML IJ SOLN
12.5000 mg | INTRAMUSCULAR | Status: DC | PRN
  Administered 2017-06-28: 12.5 mg via INTRAVENOUS

## 2017-06-28 MED ORDER — KETOROLAC TROMETHAMINE 30 MG/ML IJ SOLN
INTRAMUSCULAR | Status: AC
  Filled 2017-06-28: qty 1

## 2017-06-28 MED ORDER — MEPERIDINE HCL 25 MG/ML IJ SOLN
6.2500 mg | INTRAMUSCULAR | Status: DC | PRN
  Administered 2017-06-28: 6.25 mg via INTRAVENOUS

## 2017-06-28 MED ORDER — MORPHINE SULFATE (PF) 0.5 MG/ML IJ SOLN
INTRAMUSCULAR | Status: AC
  Filled 2017-06-28: qty 10

## 2017-06-28 MED ORDER — OXYCODONE-ACETAMINOPHEN 5-325 MG PO TABS
2.0000 | ORAL_TABLET | ORAL | Status: DC | PRN
  Administered 2017-06-29 – 2017-07-01 (×9): 2 via ORAL
  Filled 2017-06-28 (×9): qty 2

## 2017-06-28 MED ORDER — NALOXONE HCL 0.4 MG/ML IJ SOLN: 0.4000 mg | INTRAMUSCULAR | Status: DC | PRN

## 2017-06-28 MED ORDER — PROMETHAZINE HCL 25 MG/ML IJ SOLN: 6.2500 mg | INTRAMUSCULAR | Status: DC | PRN

## 2017-06-28 MED ORDER — MISOPROSTOL 200 MCG PO TABS
ORAL_TABLET | ORAL | Status: AC
  Filled 2017-06-28: qty 4

## 2017-06-28 MED ORDER — DIPHENHYDRAMINE HCL 25 MG PO CAPS
25.0000 mg | ORAL_CAPSULE | ORAL | Status: DC | PRN
  Administered 2017-06-29: 25 mg via ORAL
  Filled 2017-06-28: qty 1

## 2017-06-28 MED ORDER — LABETALOL HCL 5 MG/ML IV SOLN: 20.0000 mg | INTRAVENOUS | Status: DC | PRN

## 2017-06-28 MED ORDER — OXYTOCIN 10 UNIT/ML IJ SOLN
INTRAVENOUS | Status: DC | PRN
  Administered 2017-06-28: 40 [IU] via INTRAVENOUS

## 2017-06-28 MED ORDER — ACETAMINOPHEN 500 MG PO TABS
1000.0000 mg | ORAL_TABLET | Freq: Four times a day (QID) | ORAL | Status: AC
  Administered 2017-06-28 – 2017-06-29 (×2): 1000 mg via ORAL
  Filled 2017-06-28 (×2): qty 2

## 2017-06-28 MED ORDER — PHENYLEPHRINE HCL 10 MG/ML IJ SOLN
INTRAMUSCULAR | Status: DC | PRN
  Administered 2017-06-28 (×6): 80 ug via INTRAVENOUS

## 2017-06-28 MED ORDER — MAGNESIUM SULFATE BOLUS VIA INFUSION
4.0000 g | Freq: Once | INTRAVENOUS | Status: AC
  Administered 2017-06-28: 4 g via INTRAVENOUS
  Filled 2017-06-28: qty 500

## 2017-06-28 MED ORDER — GENTAMICIN SULFATE 40 MG/ML IJ SOLN
140.0000 mg | Freq: Two times a day (BID) | INTRAVENOUS | Status: AC
  Administered 2017-06-28 – 2017-06-29 (×2): 140 mg via INTRAVENOUS
  Filled 2017-06-28 (×2): qty 3.5

## 2017-06-28 MED ORDER — CLINDAMYCIN PHOSPHATE 900 MG/50ML IV SOLN
900.0000 mg | Freq: Once | INTRAVENOUS | Status: AC
  Administered 2017-06-28: 900 mg via INTRAVENOUS
  Filled 2017-06-28: qty 50

## 2017-06-28 MED ORDER — NALOXONE HCL 4 MG/10ML IJ SOLN
1.0000 ug/kg/h | INTRAVENOUS | Status: DC | PRN
  Filled 2017-06-28: qty 5

## 2017-06-28 MED ORDER — MORPHINE SULFATE-NACL 0.5-0.9 MG/ML-% IV SOSY
PREFILLED_SYRINGE | INTRAVENOUS | Status: DC | PRN
  Administered 2017-06-28: 4 mg via EPIDURAL

## 2017-06-28 MED ORDER — MISOPROSTOL 25 MCG QUARTER TABLET
25.0000 ug | ORAL_TABLET | ORAL | Status: DC | PRN
  Filled 2017-06-28: qty 1

## 2017-06-28 MED ORDER — KETOROLAC TROMETHAMINE 30 MG/ML IJ SOLN: 30.0000 mg | Freq: Four times a day (QID) | INTRAMUSCULAR | Status: AC | PRN

## 2017-06-28 MED ORDER — SODIUM CHLORIDE 0.9 % IV SOLN
2.0000 g | Freq: Four times a day (QID) | INTRAVENOUS | Status: DC
  Filled 2017-06-28: qty 2000

## 2017-06-28 MED ORDER — DIPHENHYDRAMINE HCL 50 MG/ML IJ SOLN: 12.5000 mg | INTRAMUSCULAR | Status: DC | PRN

## 2017-06-28 MED ORDER — SCOPOLAMINE 1 MG/3DAYS TD PT72
1.0000 | MEDICATED_PATCH | Freq: Once | TRANSDERMAL | Status: DC
  Filled 2017-06-28: qty 1

## 2017-06-28 MED ORDER — SODIUM CHLORIDE 0.9 % IR SOLN
Status: DC | PRN
  Administered 2017-06-28: 1

## 2017-06-28 MED ORDER — TERBUTALINE SULFATE 1 MG/ML IJ SOLN: 0.2500 mg | Freq: Once | INTRAMUSCULAR | Status: DC | PRN

## 2017-06-28 MED ORDER — ONDANSETRON HCL 4 MG/2ML IJ SOLN
4.0000 mg | Freq: Three times a day (TID) | INTRAMUSCULAR | Status: DC | PRN
Start: 2017-06-28 — End: 2017-07-01

## 2017-06-28 MED ORDER — MISOPROSTOL 200 MCG PO TABS
800.0000 ug | ORAL_TABLET | Freq: Once | ORAL | Status: AC
  Administered 2017-06-28: 800 ug via RECTAL

## 2017-06-28 SURGICAL SUPPLY — 35 items
APL SKNCLS STERI-STRIP NONHPOA (GAUZE/BANDAGES/DRESSINGS) ×1
BENZOIN TINCTURE PRP APPL 2/3 (GAUZE/BANDAGES/DRESSINGS) ×3 IMPLANT
CHLORAPREP W/TINT 26ML (MISCELLANEOUS) ×3 IMPLANT
CLAMP CORD UMBIL (MISCELLANEOUS) IMPLANT
CLOSURE STERI STRIP 1/2 X4 (GAUZE/BANDAGES/DRESSINGS) ×1 IMPLANT
CLOSURE WOUND 1/2 X4 (GAUZE/BANDAGES/DRESSINGS) ×1
CLOTH BEACON ORANGE TIMEOUT ST (SAFETY) ×3 IMPLANT
DRSG OPSITE POSTOP 4X10 (GAUZE/BANDAGES/DRESSINGS) ×3 IMPLANT
ELECT REM PT RETURN 9FT ADLT (ELECTROSURGICAL) ×3
ELECTRODE REM PT RTRN 9FT ADLT (ELECTROSURGICAL) ×1 IMPLANT
EXTRACTOR VACUUM M CUP 4 TUBE (SUCTIONS) IMPLANT
EXTRACTOR VACUUM M CUP 4' TUBE (SUCTIONS)
GLOVE BIOGEL PI IND STRL 7.0 (GLOVE) ×2 IMPLANT
GLOVE BIOGEL PI IND STRL 7.5 (GLOVE) ×2 IMPLANT
GLOVE BIOGEL PI INDICATOR 7.0 (GLOVE) ×4
GLOVE BIOGEL PI INDICATOR 7.5 (GLOVE) ×4
GLOVE ECLIPSE 7.5 STRL STRAW (GLOVE) ×3 IMPLANT
GOWN STRL REUS W/TWL LRG LVL3 (GOWN DISPOSABLE) ×9 IMPLANT
KIT ABG SYR 3ML LUER SLIP (SYRINGE) IMPLANT
NDL HYPO 25X5/8 SAFETYGLIDE (NEEDLE) IMPLANT
NEEDLE HYPO 25X5/8 SAFETYGLIDE (NEEDLE) IMPLANT
NS IRRIG 1000ML POUR BTL (IV SOLUTION) ×3 IMPLANT
PACK C SECTION WH (CUSTOM PROCEDURE TRAY) ×3 IMPLANT
PAD OB MATERNITY 4.3X12.25 (PERSONAL CARE ITEMS) ×3 IMPLANT
PENCIL SMOKE EVAC W/HOLSTER (ELECTROSURGICAL) ×3 IMPLANT
RTRCTR C-SECT PINK 25CM LRG (MISCELLANEOUS) ×3 IMPLANT
STRIP CLOSURE SKIN 1/2X4 (GAUZE/BANDAGES/DRESSINGS) ×2 IMPLANT
SUT VIC AB 0 CT1 36 (SUTURE) ×3 IMPLANT
SUT VIC AB 0 CTX 36 (SUTURE) ×9
SUT VIC AB 0 CTX36XBRD ANBCTRL (SUTURE) ×2 IMPLANT
SUT VIC AB 2-0 CT1 27 (SUTURE) ×3
SUT VIC AB 2-0 CT1 TAPERPNT 27 (SUTURE) ×1 IMPLANT
SUT VIC AB 4-0 KS 27 (SUTURE) ×3 IMPLANT
TOWEL OR 17X24 6PK STRL BLUE (TOWEL DISPOSABLE) ×3 IMPLANT
TRAY FOLEY BAG SILVER LF 14FR (SET/KITS/TRAYS/PACK) ×3 IMPLANT

## 2017-06-28 NOTE — Progress Notes (Signed)
Cheyenne Lozano is a 27 y.o. G2P0010 at 2539w6d by LMP admitted for induction of labor due to Hypertension now with preeclampsia. Pregnancy complicated by T2DM (on metformin).   Subjective: Patient doing well with no concerns. Having some pain but well controlled. Family at bedside. Patient is having boy and desires circumcision while inpatient but is unsure if it will be covered by insurance as it is beginning of year. Planning on breast feeding. Wants POP for contraception.   Objective: BP 115/64   Pulse (!) 112   Temp 98.9 F (37.2 C) (Oral)   Resp 18   Ht 4\' 11"  (1.499 m)   Wt 72.1 kg (159 lb)   LMP 09/29/2016   SpO2 99%   BMI 32.11 kg/m  I/O last 3 completed shifts: In: -  Out: 1625 [Urine:1625] Total I/O In: -  Out: 350 [Urine:350]  FHT:  FHR: 130 bpm, variability: moderate,  accelerations:  Present,  decelerations:  Present early UC:   irregular, every 2-4 minutes SVE:   Dilation: 6.5 Effacement (%): 80 Station: +1 Exam by:: Dr. Rachelle HoraMoss  Labs: Lab Results  Component Value Date   WBC 18.9 (H) 06/28/2017   HGB 9.1 (L) 06/28/2017   HCT 27.8 (L) 06/28/2017   MCV 82.2 06/28/2017   PLT 259 06/28/2017    Assessment / Plan: Induction of labor due to preeclampsia and gestational hypertension,  progressing well on pitocin  Labor: Continue pitocin Preeclampsia:  elevated Cr 1.10>1.13 Fetal Wellbeing:  Category I Pain Control:  Labor support without medications I/D:  triple I, continue amp/gent Anticipated MOD:  NSVD  Cheyenne Lozano 06/28/2017, 10:54 AM

## 2017-06-28 NOTE — Progress Notes (Signed)
Labor Progress Note Cheyenne Lozano is a 27 y.o. G2P0010 at 236w6d presented for IOL for gHTN now with preeclampsia. Pregnancy complicated by T2DM. S: Patient is feeling tired and unhappy about long induction  O:  BP (!) 142/91   Pulse (!) 111   Temp 98.7 F (37.1 C) (Oral)   Resp 18   Ht 4\' 11"  (1.499 m)   Wt 72.1 kg (159 lb)   LMP 09/29/2016   SpO2 99%   BMI 32.11 kg/m  EFM: 130 bpm/mod var/pos acels/ no decels  CVE: Dilation: 6.5 Effacement (%): 80 Cervical Position: Posterior Station: +1 Presentation: Vertex Exam by:: Dr. Rachelle HoraMoss   A&P: 27 y.o. G2P0010 696w6d here for IOL with gHTN and superimposed preeclampsia. #Labor: No cervical progress. Patient has not had adequate contractions throughout the night due to fetal late decelerations. Replaced IUPC. Continue pitocin until adequate contractions. # preeclampsia: CMP showed creatinine of 1.1. Repeat CMP this am # triple I: continue amp and gent  Cheyenne Corporationmber Lorey Pallett, DO 9:05 AM

## 2017-06-28 NOTE — Transfer of Care (Signed)
Immediate Anesthesia Transfer of Care Note  Patient: Cheyenne Lozano  Procedure(s) Performed: CESAREAN SECTION (N/A )  Patient Location: PACU  Anesthesia Type:Epidural  Level of Consciousness: awake  Airway & Oxygen Therapy: Patient Spontanous Breathing  Post-op Assessment: Report given to RN  Post vital signs: Reviewed and stable  Last Vitals:  Vitals:   06/28/17 1600 06/28/17 1630  BP: (!) 147/88 (!) 144/85  Pulse: (!) 115 (!) 115  Resp: 18   Temp: 36.9 C   SpO2:      Last Pain:  Vitals:   06/28/17 1600  TempSrc: Oral  PainSc: 0-No pain      Patients Stated Pain Goal: 8 (06/27/17 1530)  Complications: No apparent anesthesia complications

## 2017-06-28 NOTE — Anesthesia Postprocedure Evaluation (Signed)
Anesthesia Post Note  Patient: Cheyenne Lozano  Procedure(s) Performed: CESAREAN SECTION (N/A )     Patient location during evaluation: PACU Anesthesia Type: Epidural Level of consciousness: awake and alert Pain management: pain level controlled Vital Signs Assessment: post-procedure vital signs reviewed and stable Respiratory status: spontaneous breathing and respiratory function stable Cardiovascular status: blood pressure returned to baseline and stable Postop Assessment: no apparent nausea or vomiting and epidural receding Anesthetic complications: no    Last Vitals:  Vitals:   06/28/17 1915 06/28/17 1930  BP: 122/82 (!) 145/87  Pulse: (!) 107 (!) 132  Resp: (!) 23 11  Temp:    SpO2: 100% 92%    Last Pain:  Vitals:   06/28/17 1930  TempSrc:   PainSc: 5                  Beryle Lathehomas E Brock

## 2017-06-28 NOTE — Progress Notes (Signed)
Labor Progress Note Cheyenne Lozano is a 27 y.o. G2P0010 at 5257w6d presented for IOL for gHTN with superimposed preeclampsia with severe features.   S:No complaints   O:  BP 138/83   Pulse (!) 113   Temp 98.9 F (37.2 C) (Oral)   Resp 18   Ht 4\' 11"  (1.499 m)   Wt 72.1 kg (159 lb)   LMP 09/29/2016   SpO2 99%   BMI 32.11 kg/m  EFM: 130 bpm/mod var/no decels  CVE: Dilation: 6 Effacement (%): 80 Cervical Position: Posterior Station: +1 Presentation: Vertex Exam by:: Dr. Rachelle HoraMoss   A&P: 27 y.o. G2P0010 7157w6d here for IOL for gHTN with superimposed preeclampsia with severe features. #Labor: No cervical change since 2000 last night. IUPC in place. Not adequate on 24 mU/min pitocin. Continue pitocin to get adequate contraction pattern. #preeclampsia with severe features- patient has RUQ pain that improved only briefly and now has returned. Blood pressures well controlled. cmp q8hr. Cont magnesium #triple I: ruptured greater than 24 hours. Continue amp/gent #fetal well-being: cat 1  Cheyenne Charbonneau, DO 3:39 PM

## 2017-06-28 NOTE — Progress Notes (Signed)
Labor Progress Note Cheyenne Lozano is a 27 y.o. G2P0010 at 1552w5d presented for IOL for GHTN. Pregnancy also complicated by T2DM.  S: Comfortable with epidural. O:  BP 107/63   Pulse (!) 105   Temp 99 F (37.2 C) (Oral)   Resp 16   Ht 4\' 11"  (1.499 m)   Wt 72.1 kg (159 lb)   LMP 09/29/2016   SpO2 97%   BMI 32.11 kg/m  EFM: 130/min variability/+ accels/+ decels (variables)  CVE: Dilation: 6 Effacement (%): 80 Cervical Position: Posterior Station: +1 Presentation: Vertex Exam by:: Dr. Doroteo GlassmanPhelps    A&P: 27 y.o. G2P0010 7052w5d IOL for GHTN. Pregnancy also complicated by T2DM.  #Labor: Early labor. Not in active, adequate labor. Pitocin was discontinued due to late decels. FHT more reassuring now so will attempt to augment with pitocin again.  # GHTN: BPs wnl. PIH labs reordered due to poor UOP. P/C inaccurate due to blood. Cr is elevated. Continue to monitor. Will treat if severe features present.  # T2DM: CBGs have been at goal, continue to monitor q2h in labor #Pain: epidural #FWB: CAT II NST  Hopeful for NSVD; have discussed possibility of c/s with patient if fetus unable to tolerate induction.  [name], DO OB Fellow

## 2017-06-28 NOTE — Progress Notes (Signed)
Fever Patient with mildly elevated temperatures, tachycardia, and new elevated fetal baseline. -Amp/Gent for suspicion of Triple I started.   Category 2 tracing  - recurrent decels with some minimal variability.  -Internal monitors placed. -Fluid bolus given for low BPs  Labor -Not making much cervical change in the last 4 hours.  Contractions not adequate.  -Continue to increase pitocin as able. -Need to get MVUs 180-220  gHTN -recollect preE labs as patient had minimal UOP -PIH symtpoms negative -no severe range pressures  Dr. Adrian BlackwaterStinson updated. Will continue to monitor.   Caryl AdaJazma Indica Marcott, DO OB Fellow

## 2017-06-28 NOTE — Progress Notes (Signed)
Cheyenne Lozano is a 27 y.o. G2P0010 at 5954w6d by LMP admitted for induction of labor due to Hypertension. Pregnancy complicated by T2DM (on metform).   Subjective: Patient very concerned about labor and why she has not delivered yet. Patient pain well controlled. Mother at bedside. Patient's worries about fetal health were alleviated after discussing case with Dr. Rachelle HoraMoss.   Objective: BP 127/84   Pulse 97   Temp 98.6 F (37 C) (Oral)   Resp 20   Ht 4\' 11"  (1.499 m)   Wt 72.1 kg (159 lb)   LMP 09/29/2016   SpO2 99%   BMI 32.11 kg/m  I/O last 3 completed shifts: In: -  Out: 1625 [Urine:1625] Total I/O In: 958.9 [P.O.:430; I.V.:250.9; Other:21; IV Piggyback:257] Out: 600 [Urine:600]  FHT:  FHR: 140 bpm, variability: moderate,  accelerations:  Present,  decelerations:  Present late UC:   regular, every 2 minutes SVE:   Dilation: 6.5 Effacement (%): 80 Station: +1 Exam by:: (Dr. Darin EngelsAbraham)  Labs: Lab Results  Component Value Date   WBC 18.9 (H) 06/28/2017   HGB 9.1 (L) 06/28/2017   HCT 27.8 (L) 06/28/2017   MCV 82.2 06/28/2017   PLT 259 06/28/2017    Assessment / Plan: Induction of labor due to preeclampsia,  progressing well on pitocin  Labor: patient with minimal dilatation, however contractions not adequate (190 MVUs). Continue pitocin. Can plan to re-check, may consider C-section if no progresson  Preeclampsia:  on magnesium sulfate Fetal Wellbeing:  Category I Pain Control:  Epidural I/D:  triple I, continue amp/gent Anticipated MOD:  NSVD  Cheyenne Lozano 06/28/2017, 12:40 PM

## 2017-06-28 NOTE — Op Note (Signed)
Cheyenne Lozano PROCEDURE DATE: 06/28/2017  PREOPERATIVE DIAGNOSES: Intrauterine pregnancy at 4721w6d weeks gestation; arrest of dilation; fetal indications  POSTOPERATIVE DIAGNOSES: The same  PROCEDURE: Primary Low Transverse Cesarean Section  SURGEON, primary:  Shonna ChockNoah Wouk, MD SURGEON, fellow: Rolm BookbinderAmber Moss, DO    ANESTHESIOLOGY TEAM: Anesthesiologist: Beryle LatheBrock, Thomas E, MD; Lewie LoronGermeroth, John, MD; Cristela BlueJackson, Kyle, MD CRNA: Algis GreenhouseBurger, Linda A, CRNA  INDICATIONS: Cheyenne NewcomerChelsey A Lozano is a 27 y.o. [phone] at 5121w6d here for cesarean section secondary to the indications listed under preoperative diagnoses; please see preoperative note for further details.  The risks of cesarean section were discussed with the patient including but were not limited to: bleeding which may require transfusion or reoperation; infection which may require antibiotics; injury to bowel, bladder, ureters or other surrounding organs; injury to the fetus; need for additional procedures including hysterectomy in the event of a life-threatening hemorrhage; placental abnormalities wth subsequent pregnancies, incisional problems, thromboembolic phenomenon and other postoperative/anesthesia complications.   The patient concurred with the proposed plan, giving informed written consent for the procedure.    FINDINGS:  Viable female infant in cephalic presentation.  Apgars 2 and 6.  Clear amniotic fluid.  Intact placenta, three vessel cord.  Normal uterus, fallopian tubes and ovaries bilaterally.  ANESTHESIA: Epidural   ESTIMATED BLOOD LOSS: 777 ml URINE OUTPUT:  200 ml, pink-tinged  SPECIMENS: Placenta sent to pathology COMPLICATIONS: None immediate  PROCEDURE IN DETAIL:  The patient preoperatively received intravenous antibiotics and had sequential compression devices applied to her lower extremities.  She was then taken to the operating room where the epidural anesthesia was dosed up to surgical level and was found to be adequate. She was then  placed in a dorsal supine position with a leftward tilt, and prepped and draped in a sterile manner.  A foley catheter was placed into her bladder and attached to constant gravity.  After an adequate timeout was performed, a Pfannenstiel skin incision was made with scalpel and carried through to the underlying layer of fascia. The fascia was incised in the midline, and this incision was extended bilaterally using blunt dissection. Kocher clamps were applied to the superior aspect of the fascial incision and the underlying rectus muscles were dissected off bluntly.  A similar process was carried out on the inferior aspect of the fascial incision. The rectus muscles were separated in the midline bluntly and the peritoneum was entered bluntly. A significant amount of straw-colored fluid was encountered in abdomen. Attention was turned to the lower uterine segment where a low transverse hysterotomy was made with a scalpel and extended bilaterally bluntly.  The infant was successfully delivered, the cord was clamped and cut, and the infant was handed over to the awaiting neonatology team. Uterine massage was then administered, and the placenta delivered intact with a three-vessel cord. The uterus was then cleared of clots and debris.  The hysterotomy was closed with 0 Vicryl in a running locked fashion, and an imbricating layer was also placed with 0 Vicryl.  Figure-of-eight 0 Vicryl serosal stitches were placed to help with hemostasis.  The pelvis was cleared of all clot and debris. Hemostasis was confirmed on all surfaces.  The peritoneum was closed with a 0 Vicryl running stitch.The fascia was then closed using 0 Vicryl in a running fashion.  The subcutaneous layer was irrigated.The skin was closed with a 4-0 Vicryl subcuticular stitch. The patient tolerated the procedure well. Sponge, lap, instrument and needle counts were correct x 3.  She was taken to the  recovery room in stable condition. Patient given  prophylactic dose of cytotec 800 mgc rectally due to risk factors for PPH. Patient will continue IV antibiotics for triple I for at least 24 hours following delivery.  Of note, during the case anethesia notified that at start of case urine was pink tinged and remained pink-tinged during the case. Very low suspicion of bladder or urethral injury as bladder far from hysterotomy and hysterotomy margins were not significantly lateral. Lower uterine segment and upper portion of bladder carefully examined for injury and none was visualized.   Rolm Bookbinder, DO Faculty Practice, [facility]  Attestation of Attending Supervision of Provider:  Evaluation and management procedures were performed by this provider under my supervision and collaboration. I have reviewed the provider's note and chart, and I agree with the management and plan.  Shonna Chock, MD Faculty Practice, [facility]

## 2017-06-28 NOTE — Progress Notes (Signed)
Evaluated patient at bedside. Hx pre-gestational diabetes well controlled, new dx severe preeclampsia (elevated creatinine) and triple I. IOL now for 48 hours. Has had > 24 hours pitocin and arom now for about 24 hours as well. Category 2 tracing. Cervix unchanged at 6 cm for past 24 hours. Has had intermittent recurrent runs of late decels, have had to stop pitocin now for second time. Unable to achieve adequate contractions. Shared decision w/ patient to proceed to c/s for arrest of dilation and fetal indications. Will give clindamycin 900 mg IV once prior to case (is on amp/gent).

## 2017-06-29 ENCOUNTER — Inpatient Hospital Stay (HOSPITAL_COMMUNITY): Payer: 59

## 2017-06-29 ENCOUNTER — Other Ambulatory Visit: Payer: Self-pay

## 2017-06-29 LAB — CBC
HCT: 27.8 % — ABNORMAL LOW (ref 36.0–46.0)
HEMOGLOBIN: 9.2 g/dL — AB (ref 12.0–15.0)
MCH: 27.2 pg (ref 26.0–34.0)
MCHC: 33.1 g/dL (ref 30.0–36.0)
MCV: 82.2 fL (ref 78.0–100.0)
PLATELETS: 263 10*3/uL (ref 150–400)
RBC: 3.38 MIL/uL — AB (ref 3.87–5.11)
RDW: 14.2 % (ref 11.5–15.5)
WBC: 19 10*3/uL — ABNORMAL HIGH (ref 4.0–10.5)

## 2017-06-29 LAB — COMPREHENSIVE METABOLIC PANEL
ALBUMIN: 2 g/dL — AB (ref 3.5–5.0)
ALT: 9 U/L — AB (ref 14–54)
AST: 24 U/L (ref 15–41)
Alkaline Phosphatase: 87 U/L (ref 38–126)
Anion gap: 9 (ref 5–15)
BILIRUBIN TOTAL: 0.5 mg/dL (ref 0.3–1.2)
BUN: 8 mg/dL (ref 6–20)
CHLORIDE: 99 mmol/L — AB (ref 101–111)
CO2: 25 mmol/L (ref 22–32)
CREATININE: 1.14 mg/dL — AB (ref 0.44–1.00)
Calcium: 8.1 mg/dL — ABNORMAL LOW (ref 8.9–10.3)
GFR calc Af Amer: >60 mL/min (ref >60)
GLUCOSE: 85 mg/dL (ref 65–99)
Potassium: 3.9 mmol/L (ref 3.5–5.1)
Sodium: 133 mmol/L — ABNORMAL LOW (ref 135–145)
Total Protein: 4.9 g/dL — ABNORMAL LOW (ref 6.5–8.1)

## 2017-06-29 LAB — GLUCOSE, CAPILLARY: Glucose-Capillary: 87 mg/dL (ref 65–99)

## 2017-06-29 MED ORDER — ENOXAPARIN SODIUM 40 MG/0.4ML ~~LOC~~ SOLN
40.0000 mg | SUBCUTANEOUS | Status: DC
  Administered 2017-06-29 – 2017-07-01 (×2): 40 mg via SUBCUTANEOUS
  Filled 2017-06-29 (×4): qty 0.4

## 2017-06-29 NOTE — Lactation Note (Signed)
This note was copied from a baby's chart. Lactation Consultation Note  Patient Name: Cheyenne Lozano ZOXWR'UToday's Date: 06/29/2017 Reason for consult: Initial assessment;Early term 37-38.6wks;NICU baby;Breast reduction;Other (Comment)(bilateral breast reduction see LC note )  Baby is 22 hours old,  Per mom when the breast reduction was done both nipples and areolas were removed and reattached)  Also mom mentioned she was told at that time wasn't sure if she would be able to breast feed.  LC discussed due to her breast surgery - it is a wait and see process, and hand expressing before and after the pumping or feeding is important to enhance let down. Consistent pumping and hand expressing is the key and hand expressing. Discussed power pumping once a day for 60 mins total ( explained to mom 10 mins on / off over 60 mins , or 20 mins on 10 mins off over 60 mins.  Per mom is attempting to latch in NICU.  LC assessed breast tissue and noted both areolas to be compressible, but some edema.  LC instructed mom on the use shells between feedings except when sleeping.  Per [facility] and didn't think she would breast feed and decided to try and see if it works.  WIC reps into see mom to provide the DEBP for home and sign baby up. LC was told [facility]  Mother informed of post-discharge support and given phone number to the lactation department, including services for phone call assistance; out-patient appointments; and breastfeeding support group. List of other breastfeeding resources in the community given in the handout. Encouraged mother to call for problems or concerns related to breastfeeding.   Maternal Data Has patient been taught Hand Expression?: Yes(LC reviewed and EBM yield from bith breast. mofre from the right compared to left / areola edema ) Does the patient have breastfeeding experience prior to this delivery?:  No  Feeding Feeding Type: Bottle Fed - Formula Nipple Type: Slow - flow Length of feed: 30 min  LATCH Score                   Interventions Interventions: Breast feeding basics reviewed;DEBP  Lactation Tools Discussed/Used Tools: Pump(LC instructed mom due to areola edema ) Breast pump type: Double-Electric Breast Pump WIC Program: Yes([facility] ) Pump Review: Setup, frequency, and cleaning;Milk Storage(has been set up by the [facility] / RN / LC reviewed ) Initiated by:: MAI / reviewed    Consult Status Consult Status: Follow-up Date: 06/30/17 Follow-up type: In-patient    [name] 06/29/2017, 3:53 PM

## 2017-06-29 NOTE — Plan of Care (Signed)
  Education: Knowledge of General Education information will improve 06/29/2017 0313 - Progressing by Bobbye MortonAcheampong, Stark Aguinaga, RN   Activity: Risk for activity intolerance will decrease 06/29/2017 0313 - Progressing by Bobbye MortonAcheampong, Jolene Guyett, RN   Nutrition: Adequate nutrition will be maintained 06/29/2017 0313 - Progressing by Bobbye MortonAcheampong, Ghina Bittinger, RN   Pain Managment: General experience of comfort will improve 06/29/2017 0313 - Progressing by Bobbye MortonAcheampong, Lofton Leon, RN

## 2017-06-29 NOTE — Anesthesia Postprocedure Evaluation (Signed)
Anesthesia Post Note  Patient: Cheyenne Lozano  Procedure(s) Performed: CESAREAN SECTION (N/A )     Patient location during evaluation: Mother Baby Anesthesia Type: Epidural Level of consciousness: awake and alert and oriented Pain management: satisfactory to patient Vital Signs Assessment: post-procedure vital signs reviewed and stable Respiratory status: respiratory function stable Cardiovascular status: stable Postop Assessment: no headache, no backache, epidural receding, patient able to bend at knees, no signs of nausea or vomiting and adequate PO intake Anesthetic complications: no    Last Vitals:  Vitals:   06/29/17 0701 06/29/17 0800  BP: 137/89 (!) 151/92  Pulse: 87 84  Resp: 18 18  Temp:    SpO2:  97%    Last Pain:  Vitals:   06/29/17 0800  TempSrc: Oral  PainSc: 2    Pain Goal: Patients Stated Pain Goal: 3 (06/29/17 0800)               Karleen DolphinFUSSELL,Jaquanda Wickersham

## 2017-06-29 NOTE — Addendum Note (Signed)
Addendum  created 06/29/17 0931 by Graciela HusbandsFussell, Filippo Puls O, CRNA   Charge Capture section accepted, Sign clinical note

## 2017-06-29 NOTE — Progress Notes (Signed)
Subjective: Postpartum Day 1: Cesarean Delivery, triple I, FTP x 24 h. Patient reports tolerating PO.   Foley to be d/c'd Objective: Vital signs in last 24 hours: Temp:  [98 F (36.7 C)-99.4 F (37.4 C)] 98.6 F (37 C) (01/10 0330) Pulse Rate:  [83-132] 84 (01/10 0800) Resp:  [11-23] 18 (01/10 0800) BP: (107-157)/(64-104) 151/92 (01/10 0800) SpO2:  [92 %-100 %] 97 % (01/10 0800) Weight:  [159 lb (72.1 kg)] 159 lb (72.1 kg) (01/10 0413)  Physical Exam:  General: alert, cooperative, appears stated age and no distress Lochia: appropriate Uterine Fundus: firm nontender Incision: healing well, no significant drainage, no significant erythema DVT Evaluation: No evidence of DVT seen on physical exam.  Recent Labs    06/28/17 0915 06/29/17 0350  HGB 9.1* 9.2*  HCT 27.8* 27.8*    Assessment/Plan: Status post Cesarean section. Postoperative course complicated by continued mag therapy til 5 pm, antibiotics x 24 hr due to triple I  Continue current care.  Tilda BurrowJohn V Maureena Dabbs 06/29/2017, 8:47 AM

## 2017-06-30 ENCOUNTER — Encounter (HOSPITAL_COMMUNITY): Payer: Self-pay | Admitting: Obstetrics and Gynecology

## 2017-06-30 LAB — BASIC METABOLIC PANEL
ANION GAP: 10 (ref 5–15)
BUN: 7 mg/dL (ref 6–20)
CO2: 27 mmol/L (ref 22–32)
Calcium: 8 mg/dL — ABNORMAL LOW (ref 8.9–10.3)
Chloride: 102 mmol/L (ref 101–111)
Creatinine, Ser: 1.06 mg/dL — ABNORMAL HIGH (ref 0.44–1.00)
GFR calc Af Amer: >60 mL/min (ref >60)
GLUCOSE: 76 mg/dL (ref 65–99)
POTASSIUM: 4.2 mmol/L (ref 3.5–5.1)
Sodium: 139 mmol/L (ref 135–145)

## 2017-06-30 NOTE — Progress Notes (Signed)
Faculty Attending Note  Post Op Day 2  Subjective: Patient is feeling okay. She reports moderately well controlled pain on PO pain meds. She is ambulating and has some mild headache with blurry vision although she wears glasses part of the time and does not have them with her. Denies light-headedness or dizziness. She is  passing flatus. She is tolerating a regular diet without nausea/vomiting. Bleeding is moderately heavy. She is breast pumping/feeding. Baby is in NICU for hypoglycemia and doing okay.  Objective: Blood pressure 120/76, pulse 88, temperature 97.8 F (36.6 C), temperature source Oral, resp. rate 18, height 4\' 11"  (1.499 m), weight 166 lb 2 oz (75.4 kg), last menstrual period 09/29/2016, SpO2 99 %, unknown if currently breastfeeding. Temp:  [97.8 F (36.6 C)-98.8 F (37.1 C)] 97.8 F (36.6 C) (01/11 0422) Pulse Rate:  [76-92] 88 (01/11 0422) Resp:  [18] 18 (01/11 0422) BP: (120-136)/(76-96) 120/76 (01/11 0422) SpO2:  [99 %-100 %] 99 % (01/11 0422) Weight:  [166 lb 2 oz (75.4 kg)] 166 lb 2 oz (75.4 kg) (01/11 0422)  Physical Exam:  General: alert, oriented, cooperative Chest: CTAB, normal respiratory effort Heart: RRR  Abdomen: soft, appropriately tender to palpation, incision covered by dressing with no evidence of active bleeding, dark red blood on dressing  Uterine Fundus: firm, at the umbilicus Lochia: moderate, rubra DVT Evaluation: no evidence of DVT Extremities: no edema, no calf tenderness  UOP: voiding spontaneously   Current Facility-Administered Medications:  .  diphenhydrAMINE (BENADRYL) injection 12.5 mg, 12.5 mg, Intravenous, Q4H PRN **OR** diphenhydrAMINE (BENADRYL) capsule 25 mg, 25 mg, Oral, Q4H PRN, Beryle LatheBrock, Thomas E, MD, 25 mg at 06/29/17 0013 .  enoxaparin (LOVENOX) injection 40 mg, 40 mg, Subcutaneous, Q24H, Moss, Amber, DO, 40 mg at 06/29/17 0655 .  hydrALAZINE (APRESOLINE) injection 5-10 mg, 5-10 mg, Intravenous, Q20 Min PRN, Moss, [facility], DO .   labetalol (NORMODYNE,TRANDATE) injection 20-40 mg, 20-40 mg, Intravenous, Q10 min PRN, Moss, Amber, DO .  lactated ringers infusion, , Intravenous, Continuous, Moss, Amber, DO, Stopped at 06/29/17 1800 .  meperidine (DEMEROL) injection 12.5 mg, 12.5 mg, Intravenous, Q2H PRN, Bethena Midgetddono, Ernest, MD, 12.5 mg at 06/28/17 1847 .  nalbuphine (NUBAIN) injection 5 mg, 5 mg, Intravenous, Q4H PRN **OR** nalbuphine (NUBAIN) injection 5 mg, 5 mg, Subcutaneous, Q4H PRN, Beryle LatheBrock, Thomas E, MD .  nalbuphine (NUBAIN) injection 5 mg, 5 mg, Intravenous, Once PRN **OR** nalbuphine (NUBAIN) injection 5 mg, 5 mg, Subcutaneous, Once PRN, Beryle LatheBrock, Thomas E, MD .  naloxone [facility](NARCAN) injection 0.4 mg, 0.4 mg, Intravenous, PRN **AND** sodium chloride flush (NS) 0.9 % injection 3 mL, 3 mL, Intravenous, PRN, Beryle LatheBrock, Thomas E, MD .  naloxone HCl (NARCAN) 2 mg in dextrose 5 % 250 mL infusion, 1-4 mcg/kg/hr, Intravenous, Continuous PRN, Beryle LatheBrock, Thomas E, MD .  ondansetron [facility](ZOFRAN) injection 4 mg, 4 mg, Intravenous, Q8H PRN, Beryle LatheBrock, Thomas E, MD .  oxyCODONE-acetaminophen (PERCOCET/ROXICET) 5-325 MG per tablet 1 tablet, 1 tablet, Oral, Q4H PRN, Wouk, Wilfred CurtisNoah Bedford, MD .  oxyCODONE-acetaminophen (PERCOCET/ROXICET) 5-325 MG per tablet 2 tablet, 2 tablet, Oral, Q4H PRN, Ashok PallWouk, Wilfred CurtisNoah Bedford, MD, 2 tablet at 06/30/17 0754 .  scopolamine (TRANSDERM-SCOP) 1 MG/3DAYS 1.5 mg, 1 patch, Transdermal, Once, Beryle LatheBrock, Thomas E, MD .  zolpidem (AMBIEN) tablet 5 mg, 5 mg, Oral, QHS PRN, Kathrynn RunningWouk, Noah Bedford, MD Recent Labs    06/28/17 0915 06/29/17 0350  HGB 9.1* 9.2*  HCT 27.8* 27.8*   CMP Latest Ref Rng & Units 06/29/2017 06/28/2017 06/28/2017  Glucose 65 -  99 mg/dL 85 93 84  BUN 6 - 20 mg/dL 8 6 5(L)  Creatinine 1.61 - 1.00 mg/dL 0.96(E) 4.54(U) 9.81(X)  Sodium 135 - 145 mmol/L 133(L) 134(L) 135  Potassium 3.5 - 5.1 mmol/L 3.9 3.8 3.5  Chloride 101 - 111 mmol/L 99(L) 103 106  CO2 22 - 32 mmol/L 25 22 20(L)  Calcium 8.9 - 10.3 mg/dL 8.1(L) 8.1(L) 8.0(L)   Total Protein 6.5 - 8.1 g/dL 4.9(L) 5.0(L) 5.5(L)  Total Bilirubin 0.3 - 1.2 mg/dL 0.5 0.5 1.1  Alkaline Phos 38 - 126 U/L 87 98 96  AST 15 - 41 U/L 24 23 20   ALT 14 - 54 U/L 9(L) 9(L) 10(L)     Assessment/Plan:  Patient is 27 y.o. [id number] POD#2 s/p 1LTCS at [redacted]w[redacted]d for arrest of dilation, labor complicated by pre-eclampsia with severe features and chorioamnionitis. She is s/p 24 hrs post op MgOS4 as well as gentamycin x 24 hrs post delivery. Still with elevated Cr, will send BMP today. She is doing okay, still with significant abdominal pain and mild headache.   Routine post partum care - pain meds prn - diabetic diet - OCPs   PEC w SF - s/p MgSO4 - BP stable no meds - H/H stable - repeat BMP today  T2DM - CBGs well controlled - on metformin 500 mg bID during pregnancy, diet controlled prior to pregnancy   Plan for discharge likely tomorrow   Conan Bowens 06/30/2017, 10:41 AM

## 2017-06-30 NOTE — Lactation Note (Signed)
This note was copied from a baby's chart. Lactation Consultation Note  Patient Name: Cheyenne Lozano ZOXWR'UToday's Date: 06/30/2017   Visited with P1 Mom with early term infant that is in NICU.  Mom with GDM and PIH, and history of breast reduction surgery.  Mom concerned as she was able to attain enough colostrum for 2 of baby's feedings early on, but now is only getting drops.  Reassured her that this is WNL.    Mom states baby is being transferred out of NICU this evening. To keep baby STS and offer breast on cue, supplementing per Ped guidelines 20-30 ml.  Discussed paced bottle feeding.    Mom to continue breast massage, and hand expression, along with double pumping >8 times per 24 hrs.  Encouraged Mom to call her nurse for assistance prn with latching and positioning.   Lactation available prn and will follow-up in am. [facility].     Judee ClaraSmith, Milady Fleener E 06/30/2017, 5:23 PM

## 2017-07-01 MED ORDER — ACETAMINOPHEN 500 MG PO TABS: 1000.0000 mg | ORAL_TABLET | Freq: Four times a day (QID) | ORAL | 0 refills | Status: DC | PRN

## 2017-07-01 MED ORDER — OXYCODONE HCL 5 MG PO CAPS: 5.0000 mg | ORAL_CAPSULE | ORAL | 0 refills | Status: DC | PRN

## 2017-07-01 NOTE — Discharge Instructions (Signed)

## 2017-07-01 NOTE — Progress Notes (Signed)

## 2017-07-01 NOTE — Lactation Note (Signed)
This note was copied from a baby's chart. Lactation Consultation Note; Baby has been mostly bottle feeding formula since rooming on with mom. She reports she has tried to breast feed twice during the night. Reports he is doing pretty well- feels no pain, only tugging. Has not pumped during the night or yet this morning. Reviewed importance of frequent nursing or pumping to promote a good milk supply. Encouraged to always breast feed first then supplement. [facility]. Baby had formula about 1/2 hour ago. No questions at present. Reviewed our phone number, OP appointments and BFSG as resources for support after DC. To call prn  Patient Name: Cheyenne Kelly SplinterChelsey Leugers VZDGL'OToday's Date: 07/01/2017 Reason for consult: Follow-up assessment;NICU baby(now in mom's room)   Maternal Data Has patient been taught Hand Expression?: Yes Does the patient have breastfeeding experience prior to this delivery?: No  Feeding    LATCH Score                   Interventions    Lactation Tools Discussed/Used WIC Program: Yes   Consult Status Consult Status: Complete    Pamelia HoitWeeks, Audray Rumore D 07/01/2017, 10:24 AM

## 2017-07-01 NOTE — Discharge Summary (Signed)
OB Discharge Summary     Patient Name: Cheyenne Lozano DOB: 1991-02-08 MRN: [id number]  Date of admission: 06/26/2017 Delivering MD: Laurey Arrow BEDFORD   Date of discharge: 07/01/2017  Admitting diagnosis: 38wks CTX 99mns Intrauterine pregnancy: 327w6d   Secondary diagnosis:  Active Problems:   Gestational hypertension Severe preeclampsia Additional problems: Elevated serum creatinine     Discharge diagnosis: Term Pregnancy Delivered and Preeclampsia (severe)                                                                                                Post partum procedures:none  Augmentation: AROM, Pitocin, Cytotec and Foley Balloon  Complications: Intrauterine Inflammation or infection (Chorioamniotis)  Hospital course:  Induction of Labor With Cesarean Section  [age].o. yo G2P1011 at 3864w6ds admitted to the hospital 06/26/2017 for induction of labor. Patient had a labor course significant for arrest of dilation and IUI. The patient went for cesarean section due to Arrest of Dilation and Non-Reassuring FHR, and delivered a Viable infant,_0 (DBLINK,ept,110,,1,,) Membrane Rupture Time/Date: )3:55 PM ,06/27/2017   _1  of operation can be found in separate operative Note.  Patient had an uncomplicated postpartum course. She is ambulating, tolerating a regular diet, passing flatus, and urinating well.  Patient is discharged home in stable condition on 07/01/17.                                    Physical exam  Vitals:   06/30/17 2043 06/30/17 2321 07/01/17 0405 07/01/17 0520  BP: 124/87 135/85 128/81   Pulse: 91 74 73   Resp: _2 Temp: 98.3 F (36.8 C) 98.3 F (36.8 C) 98.2 F (36.8 C)   TempSrc: Oral Oral Oral   SpO2: 100% 100% 100%   Weight:    74 kg (163 lb 4 oz)  Height:       General: alert, cooperative and no distress Lochia: appropriate Uterine Fundus: firm Incision: Healing well with no significant drainage, Dressing is clean, dry, and  intact DVT Evaluation: No evidence of DVT seen on physical exam. Labs: Lab Results  Component Value Date   WBC 19.0 (H) 06/29/2017   HGB 9.2 (L) 06/29/2017   HCT 27.8 (L) 06/29/2017   MCV 82.2 06/29/2017   PLT 263 06/29/2017   CMP Latest Ref Rng & Units 06/30/2017  Glucose 65 - 99 mg/dL 76  BUN 6 - 20 mg/dL 7  Creatinine 0.44 - 1.00 mg/dL 1.06(H)  Sodium 135 - 145 mmol/L 139  Potassium 3.5 - 5.1 mmol/L 4.2  Chloride 101 - 111 mmol/L 102  CO2 22 - 32 mmol/L 27  Calcium 8.9 - 10.3 mg/dL 8.0(L)  Total Protein 6.5 - 8.1 g/dL -  Total Bilirubin 0.3 - 1.2 mg/dL -  Alkaline Phos 38 - 126 U/L -  AST 15 - 41 U/L -  ALT 14 - 54 U/L -    Discharge instruction: per After Visit Summary and "Baby and Me Booklet".  After visit meds:  Allergies as  of 07/01/2017   No Known Allergies     Medication List    STOP taking these medications   metFORMIN 500 MG tablet Commonly known as:  GLUCOPHAGE     TAKE these medications   ACCU-CHEK FASTCLIX LANCETS Misc 1 Device by Percutaneous route 4 (four) times daily.   ACCU-CHEK NANO SMARTVIEW w/Device Kit 1 Device by Does not apply route daily.   acetaminophen 500 MG tablet Commonly known as:  TYLENOL Take 2 tablets (1,000 mg total) by mouth every 6 (six) hours as needed.   docusate sodium 100 MG capsule Commonly known as:  COLACE Take 100 mg by mouth 2 (two) times daily.   ferrous sulfate 325 (65 FE) MG tablet Commonly known as:  FERROUSUL Take 1 tablet (325 mg total) 2 (two) times daily by mouth.   glucose blood test strip Use as instructed   ondansetron 4 MG disintegrating tablet Commonly known as:  ZOFRAN-ODT Take 1 tablet (4 mg total) by mouth every 8 (eight) hours as needed for nausea or vomiting.   oxycodone 5 MG capsule Commonly known as:  OXY-IR Take 1 capsule (5 mg total) by mouth every 4 (four) hours as needed.   prenatal multivitamin Tabs tablet Take 1 tablet by mouth daily at 12 noon.   terconazole 0.8 % vaginal  cream Commonly known as:  TERAZOL 3 Place 1 applicator vaginally at bedtime.       Diet: routine diet  Activity: Advance as tolerated. Pelvic rest for 6 weeks.   Outpatient follow up:BP and labs in 4 days at [facility] Follow up Appt: Future Appointments  Date Time Provider [place]  07/12/2017 10:00 AM [place]  07/25/2017  2:15 PM Dannielle Huh, DO [facility]   Follow up Visit:No Follow-up on file.  Postpartum contraception: Undecided  Newborn Data: Live born female  Birth Weight: 7 lb 3.3 oz (3270 g) APGAR: 2, 6  Newborn Delivery   Birth date/time:  06/28/2017 17:22:00 Delivery type:  C-Section, Low Transverse C-section categorization:  Primary     Baby Feeding: Breast Disposition:rooming in   07/01/2017 Emeterio Reeve, MD

## 2017-07-03 ENCOUNTER — Telehealth: Payer: Self-pay | Admitting: *Deleted

## 2017-07-03 NOTE — Telephone Encounter (Signed)
Received message from [facility] [employer] requesting information in order to complete a disability claim which pt has filed. She needs to verify the pt's delivery date and type of delivery. Please call back and provide reference # [id number]

## 2017-07-07 ENCOUNTER — Ambulatory Visit: Payer: 59 | Admitting: *Deleted

## 2017-07-07 ENCOUNTER — Encounter (HOSPITAL_COMMUNITY): Payer: Self-pay | Admitting: *Deleted

## 2017-07-07 ENCOUNTER — Inpatient Hospital Stay (HOSPITAL_COMMUNITY)
Admission: AD | Admit: 2017-07-07 | Discharge: 2017-07-07 | Disposition: A | Discharge status: Home / Self Care | Payer: 59 | Source: Ambulatory Visit | Attending: Obstetrics and Gynecology | Admitting: Obstetrics and Gynecology

## 2017-07-07 VITALS — BP 143/94 | HR 61

## 2017-07-07 DIAGNOSIS — Z833 Family history of diabetes mellitus: Secondary | ICD-10-CM | POA: Diagnosis not present

## 2017-07-07 DIAGNOSIS — O165 Unspecified maternal hypertension, complicating the puerperium: Secondary | ICD-10-CM | POA: Diagnosis not present

## 2017-07-07 DIAGNOSIS — Z8249 Family history of ischemic heart disease and other diseases of the circulatory system: Secondary | ICD-10-CM | POA: Insufficient documentation

## 2017-07-07 DIAGNOSIS — Z9889 Other specified postprocedural states: Secondary | ICD-10-CM | POA: Insufficient documentation

## 2017-07-07 DIAGNOSIS — Z79899 Other long term (current) drug therapy: Secondary | ICD-10-CM | POA: Diagnosis not present

## 2017-07-07 DIAGNOSIS — Z0130 Encounter for examination of blood pressure without abnormal findings: Secondary | ICD-10-CM

## 2017-07-07 DIAGNOSIS — I10 Essential (primary) hypertension: Secondary | ICD-10-CM | POA: Diagnosis present

## 2017-07-07 LAB — CBC
HCT: 28.3 % — ABNORMAL LOW (ref 36.0–46.0)
Hemoglobin: 9.1 g/dL — ABNORMAL LOW (ref 12.0–15.0)
MCH: 27 pg (ref 26.0–34.0)
MCHC: 32.2 g/dL (ref 30.0–36.0)
MCV: 84 fL (ref 78.0–100.0)
PLATELETS: 613 10*3/uL — AB (ref 150–400)
RBC: 3.37 MIL/uL — AB (ref 3.87–5.11)
RDW: 14.8 % (ref 11.5–15.5)
WBC: 6.3 10*3/uL (ref 4.0–10.5)

## 2017-07-07 LAB — URINALYSIS, ROUTINE W REFLEX MICROSCOPIC
BILIRUBIN URINE: NEGATIVE
Glucose, UA: NEGATIVE mg/dL
KETONES UR: NEGATIVE mg/dL
LEUKOCYTES UA: NEGATIVE
Nitrite: NEGATIVE
Protein, ur: NEGATIVE mg/dL
SPECIFIC GRAVITY, URINE: 1.005 (ref 1.005–1.030)
pH: 6 (ref 5.0–8.0)

## 2017-07-07 LAB — COMPREHENSIVE METABOLIC PANEL
ALT: 16 U/L (ref 14–54)
AST: 18 U/L (ref 15–41)
Albumin: 3 g/dL — ABNORMAL LOW (ref 3.5–5.0)
Alkaline Phosphatase: 80 U/L (ref 38–126)
Anion gap: 8 (ref 5–15)
BUN: 8 mg/dL (ref 6–20)
CHLORIDE: 106 mmol/L (ref 101–111)
CO2: 22 mmol/L (ref 22–32)
CREATININE: 0.8 mg/dL (ref 0.44–1.00)
Calcium: 8.6 mg/dL — ABNORMAL LOW (ref 8.9–10.3)
GFR calc Af Amer: >60 mL/min (ref >60)
GFR calc non Af Amer: >60 mL/min (ref >60)
GLUCOSE: 79 mg/dL (ref 65–99)
Potassium: 3.5 mmol/L (ref 3.5–5.1)
Sodium: 136 mmol/L (ref 135–145)
Total Bilirubin: 0.3 mg/dL (ref 0.3–1.2)
Total Protein: 6.8 g/dL (ref 6.5–8.1)

## 2017-07-07 LAB — PROTEIN / CREATININE RATIO, URINE
Creatinine, Urine: 71 mg/dL
Total Protein, Urine: <6 mg/dL

## 2017-07-07 MED ORDER — AMLODIPINE BESYLATE 10 MG PO TABS: 10.0000 mg | ORAL_TABLET | Freq: Every day | ORAL | 0 refills | Status: DC

## 2017-07-07 NOTE — Progress Notes (Signed)
Patient presents to clinic for BP and incision check. Bp elevated, pt endorses headache and RUQ pain. C/S incision looks well approximated and without sign of infection. Steri strips starting to come up, I removed 3.  Spoke with Dr Rachelle HoraMoss re: pt symptoms. She recommends pt go to MAU for evaluation. She spoke with patient, understanding was voiced. Report called, pt escorted to MAU.

## 2017-07-07 NOTE — MAU Note (Signed)
Patient sent up from clinic for evaluation of elevated blood pressure.  +headache +RUQ pain Rating pain 6/10 Ongoing for past 3 days  PP C/S GHTN--not currently on meds

## 2017-07-07 NOTE — Discharge Instructions (Signed)
Postpartum Hypertension °Postpartum hypertension is high blood pressure after pregnancy that remains higher than normal for more than two days after delivery. You may not realize that you have postpartum hypertension if your blood pressure is not being checked regularly. In some cases, postpartum hypertension will go away on its own, usually within a week of delivery. However, for some women, medical treatment is required to prevent serious complications, such as seizures or stroke. °The following things can affect your blood pressure: °· The type of delivery you had. °· Having received IV fluids or other medicines during or after delivery. ° °What are the causes? °Postpartum hypertension may be caused by any of the following or by a combination of any of the following: °· Hypertension that existed before pregnancy (chronic hypertension). °· Gestational hypertension. °· Preeclampsia or eclampsia. °· Receiving a lot of fluid through an IV during or after delivery. °· Medicines. °· HELLP syndrome. °· Hyperthyroidism. °· Stroke. °· Other rare neurological or blood disorders. ° °In some cases, the cause may not be known. °What increases the risk? °Postpartum hypertension can be related to one or more risk factors, such as: °· Chronic hypertension. In some cases, this may not have been diagnosed before pregnancy. °· Obesity. °· Type 2 diabetes. °· Kidney disease. °· Family history of preeclampsia. °· Other medical conditions that cause hormonal imbalances. ° °What are the signs or symptoms? °As with all types of hypertension, postpartum hypertension may not have any symptoms. Depending on how high your blood pressure is, you may experience: °· Headaches. These may be mild, moderate, or severe. They may also be steady, constant, or sudden in onset (thunderclap headache). °· Visual changes. °· Dizziness. °· Shortness of breath. °· Swelling of your hands, feet, lower legs, or face. In some cases, you may have swelling in  more than one of these locations. °· Heart palpitations or a racing heartbeat. °· Difficulty breathing while lying down. °· Decreased urination. ° °Other rare signs and symptoms may include: °· Sweating more than usual. This lasts longer than a few days after delivery. °· Chest pain. °· Sudden dizziness when you get up from sitting or lying down. °· Seizures. °· Nausea or vomiting. °· Abdominal pain. ° °How is this diagnosed? °The diagnosis of postpartum hypertension is made through a combination of physical examination findings and testing of your blood and urine. You may also have additional tests, such as a CT scan or an MRI, to check for other complications of postpartum hypertension. °How is this treated? °When blood pressure is high enough to require treatment, your options may include: °· Medicines to reduce blood pressure (antihypertensives). Tell your health care provider if you are breastfeeding or if you plan to breastfeed. There are many antihypertensive medicines that are safe to take while breastfeeding. °· Stopping medicines that may be causing hypertension. °· Treating medical conditions that are causing hypertension. °· Treating the complications of hypertension, such as seizures, stroke, or kidney problems. ° °Your health care provider will also continue to monitor your blood pressure closely and repeatedly until it is within a safe range for you. °Follow these instructions at home: °· Take medicines only as directed by your health care provider. °· Get regular exercise after your health care provider tells you that it is safe. °· Follow your health care provider’s recommendations on fluid and salt restrictions. °· Do not use any tobacco products, including cigarettes, chewing tobacco, or electronic cigarettes. If you need help quitting, ask your health care provider. °·   Keep all follow-up visits as directed by your health care provider. This is important. °Contact a health care provider  if: °· Your symptoms get worse. °· You have new symptoms, such as: °? Headache. °? Dizziness. °? Visual changes. °Get help right away if: °· You develop a severe or sudden headache. °· You have seizures. °· You develop numbness or weakness on one side of your body. °· You have difficulty thinking, speaking, or swallowing. °· You develop severe abdominal pain. °· You develop difficulty breathing, chest pain, a racing heartbeat, or heart palpitations. °These symptoms may represent a serious problem that is an emergency. Do not wait to see if the symptoms will go away. Get medical help right away. Call your local emergency services (911 in the [place] Do not drive yourself to the hospital. °This information is not intended to replace advice given to you by your health care provider. Make sure you discuss any questions you have with your health care provider. °Document Released: 02/07/2014 Document Revised: 11/09/2015 Document Reviewed: 12/19/2013 °Elsevier Interactive Patient Education © 2018 [employer] ° °

## 2017-07-07 NOTE — MAU Provider Note (Signed)
History     CSN: [id number]  Arrival date and time: 07/07/17 1532   First Provider Initiated Contact with Patient 07/07/17 1600     Chief Complaint  Patient presents with  . Hypertension   HPI Cheyenne Lozano is a 27 y.o. G2P1011 postpartum from a c/s on 1/9 who presents from clinic for an evaluation for blood pressure. Her blood pressure was 140s/90s in the office. She denies any headache or visual changes. Reports intermittent epigastric pain that she rates a 6/10 when it happens but no pain currently. She has not tried any medication for the pain when it happens.  OB History    Gravida Para Term Preterm AB Living   2 1 1   1 1    SAB TAB Ectopic Multiple Live Births   1     0 1      Past Medical History:  Diagnosis Date  . Breast hypertrophy in female 02/26/2015  . Hypertension   . Type II diabetes mellitus ([place])    "actually I'm prediabetic; dr. put me on RX to get A1C #'s down" (02/26/2015)    Past Surgical History:  Procedure Laterality Date  . BREAST REDUCTION SURGERY Bilateral 02/26/2015   Procedure: BILATERAL BREAST REDUCTION  ;  Surgeon: Crissie Reese, MD;  Location: [facility];  Service: Plastics;  Laterality: Bilateral;  . CESAREAN SECTION N/A 06/28/2017   Procedure: CESAREAN SECTION;  Surgeon: Gwynne Edinger, MD;  Location: [facility];  Service: Obstetrics;  Laterality: N/A;  . REDUCTION MAMMAPLASTY Bilateral 02/26/2015  . WISDOM TOOTH EXTRACTION  ~ 2010   "bottom 2"    Family History  Problem Relation Age of Onset  . Diabetes Father   . Hypertension Father   . Diabetes Maternal Grandmother   . Hypertension Maternal Grandmother   . Diabetes Maternal Grandfather   . Hypertension Maternal Grandfather     Social History   Tobacco Use  . Smoking status: Never Smoker  . Smokeless tobacco: Never Used  Substance Use Topics  . Alcohol use: Yes    Comment: not since pregnancy  . Drug use: No    Allergies: No Known Allergies  Medications Prior to  Admission  Medication Sig Dispense Refill Last Dose  . ACCU-CHEK FASTCLIX LANCETS MISC 1 Device by Percutaneous route 4 (four) times daily. 100 each 12 Taking  . acetaminophen (TYLENOL) 500 MG tablet Take 2 tablets (1,000 mg total) by mouth every 6 (six) hours as needed. 30 tablet 0   . Blood Glucose Monitoring Suppl (ACCU-CHEK NANO SMARTVIEW) w/Device KIT 1 Device by Does not apply route daily. 1 kit 0 Taking  . docusate sodium (COLACE) 100 MG capsule Take 100 mg by mouth 2 (two) times daily.   06/26/2017 at Unknown time  . ferrous sulfate (FERROUSUL) 325 (65 FE) MG tablet Take 1 tablet (325 mg total) 2 (two) times daily by mouth. 60 tablet 1 06/26/2017 at Unknown time  . glucose blood test strip Use as instructed 100 each 12 Taking  . ondansetron (ZOFRAN-ODT) 4 MG disintegrating tablet Take 1 tablet (4 mg total) by mouth every 8 (eight) hours as needed for nausea or vomiting. 20 tablet 3 06/25/2017 at Unknown time  . oxycodone (OXY-IR) 5 MG capsule Take 1 capsule (5 mg total) by mouth every 4 (four) hours as needed. 30 capsule 0   . Prenatal Vit-Fe Fumarate-FA (PRENATAL MULTIVITAMIN) TABS tablet Take 1 tablet by mouth daily at 12 noon.   06/25/2017 at Unknown time  .  terconazole (TERAZOL 3) 0.8 % vaginal cream Place 1 applicator vaginally at bedtime. (Patient not taking: Reported on 06/26/2017) 20 g 0 Not Taking at Unknown time    Review of Systems  Constitutional: Negative.  Negative for fatigue and fever.  HENT: Negative.   Eyes: Negative for visual disturbance.  Respiratory: Negative.  Negative for shortness of breath.   Cardiovascular: Negative.  Negative for chest pain.  Gastrointestinal: Positive for abdominal pain. Negative for constipation, diarrhea, nausea and vomiting.  Genitourinary: Negative.  Negative for dysuria.  Neurological: Negative.  Negative for dizziness and headaches.   Physical Exam   Blood pressure (!) 144/98, pulse (!) 51, temperature 98.6 F (37 C), temperature source  Oral, resp. rate 16, weight 137 lb 0.6 oz (62.2 kg), last menstrual period 09/29/2016, SpO2 100 %, unknown if currently breastfeeding.  Physical Exam  Nursing note and vitals reviewed. Constitutional: She is oriented to person, place, and time. She appears well-developed and well-nourished. No distress.  HENT:  Head: Normocephalic.  Eyes: Pupils are equal, round, and reactive to light.  Cardiovascular: Normal rate, regular rhythm and normal heart sounds.  Respiratory: Effort normal and breath sounds normal. No respiratory distress.  GI: Soft. Bowel sounds are normal. She exhibits no distension. There is no tenderness. There is no rebound and no guarding.  Neurological: She is alert and oriented to person, place, and time.  Skin: Skin is warm and dry.  Psychiatric: She has a normal mood and affect. Her behavior is normal. Judgment and thought content normal.   Patient Vitals for the past 24 hrs:  BP Temp Temp src Pulse Resp SpO2 Weight  07/07/17 1646 (!) 144/98 - - (!) 51 - - -  07/07/17 1631 138/90 - - 60 - - -  07/07/17 1616 140/84 - - 61 - - -  07/07/17 1601 (!) 149/97 - - (!) 59 - - -  07/07/17 1548 - - - - - 100 % 137 lb 0.6 oz (62.2 kg)  07/07/17 1545 (!) 143/95 98.6 F (37 C) Oral 60 16 - -    MAU Course  Procedures Results for orders placed or performed during the hospital encounter of 07/07/17 (from the past 24 hour(s))  Urinalysis, Routine w reflex microscopic     Status: Abnormal   Collection Time: 07/07/17  3:35 PM  Result Value Ref Range   Color, Urine STRAW (A) YELLOW   APPearance CLEAR CLEAR   Specific Gravity, Urine 1.005 1.005 - 1.030   pH 6.0 5.0 - 8.0   Glucose, UA NEGATIVE NEGATIVE mg/dL   Hgb urine dipstick MODERATE (A) NEGATIVE   Bilirubin Urine NEGATIVE NEGATIVE   Ketones, ur NEGATIVE NEGATIVE mg/dL   Protein, ur NEGATIVE NEGATIVE mg/dL   Nitrite NEGATIVE NEGATIVE   Leukocytes, UA NEGATIVE NEGATIVE   RBC / HPF 0-5 0 - 5 RBC/hpf   WBC, UA 0-5 0 - 5  WBC/hpf   Bacteria, UA RARE (A) NONE SEEN   Squamous Epithelial / LPF 0-5 (A) NONE SEEN   Mucus PRESENT   Protein / creatinine ratio, urine     Status: None   Collection Time: 07/07/17  3:35 PM  Result Value Ref Range   Creatinine, Urine 71.00 mg/dL   Total Protein, Urine <6 mg/dL   Protein Creatinine Ratio        0.00 - 0.15 mg/mg[Cre]  CBC     Status: Abnormal   Collection Time: 07/07/17  4:11 PM  Result Value Ref Range   WBC 6.3 4.0 -  10.5 K/uL   RBC 3.37 (L) 3.87 - 5.11 MIL/uL   Hemoglobin 9.1 (L) 12.0 - 15.0 g/dL   HCT 28.3 (L) 36.0 - 46.0 %   MCV 84.0 78.0 - 100.0 fL   MCH 27.0 26.0 - 34.0 pg   MCHC 32.2 30.0 - 36.0 g/dL   RDW 14.8 11.5 - 15.5 %   Platelets 613 (H) 150 - 400 K/uL  Comprehensive metabolic panel     Status: Abnormal   Collection Time: 07/07/17  4:11 PM  Result Value Ref Range   Sodium 136 135 - 145 mmol/L   Potassium 3.5 3.5 - 5.1 mmol/L   Chloride 106 101 - 111 mmol/L   CO2 22 22 - 32 mmol/L   Glucose, Bld 79 65 - 99 mg/dL   BUN 8 6 - 20 mg/dL   Creatinine, Ser 0.80 0.44 - 1.00 mg/dL   Calcium 8.6 (L) 8.9 - 10.3 mg/dL   Total Protein 6.8 6.5 - 8.1 g/dL   Albumin 3.0 (L) 3.5 - 5.0 g/dL   AST 18 15 - 41 U/L   ALT 16 14 - 54 U/L   Alkaline Phosphatase 80 38 - 126 U/L   Total Bilirubin 0.3 0.3 - 1.2 mg/dL   GFR calc non Af Amer >60 >60 mL/min   GFR calc Af Amer >60 >60 mL/min   Anion gap 8 5 - 15     MDM UA CBC, CMP, protein creat ratio  Assessment and Plan   1. Postpartum hypertension    -Discharge home in stable condition -Rx for norvasc 85m daily sent to patient's pharmacy -Preeclampsia precautions discussed -Patient advised to follow-up with [facility] in 1 week for BP check -Patient may return to MAU as needed or if her condition were to change or worsen  CWende MottCNM 07/07/2017, 4:48 PM   Allergies as of 07/07/2017   No Known Allergies     Medication List    STOP taking these medications   ACCU-CHEK FASTCLIX  LANCETS Misc   ACCU-CHEK NANO SMARTVIEW w/Device Kit   glucose blood test strip   oxycodone 5 MG capsule Commonly known as:  OXY-IR   terconazole 0.8 % vaginal cream Commonly known as:  TERAZOL 3     TAKE these medications   acetaminophen 500 MG tablet Commonly known as:  TYLENOL Take 2 tablets (1,000 mg total) by mouth every 6 (six) hours as needed.   amLODipine 10 MG tablet Commonly known as:  NORVASC Take 1 tablet (10 mg total) by mouth daily.   docusate sodium 100 MG capsule Commonly known as:  COLACE Take 100 mg by mouth 2 (two) times daily.   ferrous sulfate 325 (65 FE) MG tablet Commonly known as:  FERROUSUL Take 1 tablet (325 mg total) 2 (two) times daily by mouth.   ondansetron 4 MG disintegrating tablet Commonly known as:  ZOFRAN-ODT Take 1 tablet (4 mg total) by mouth every 8 (eight) hours as needed for nausea or vomiting.   prenatal multivitamin Tabs tablet Take 1 tablet by mouth daily at 12 noon.

## 2017-07-08 NOTE — Progress Notes (Signed)
Agree with nursing staff's documentation of this patient's clinic encounter.  Sherea Liptak, DO    

## 2017-07-12 ENCOUNTER — Ambulatory Visit: Payer: 59

## 2017-07-12 ENCOUNTER — Inpatient Hospital Stay (HOSPITAL_COMMUNITY): Payer: 59

## 2017-07-12 ENCOUNTER — Inpatient Hospital Stay (HOSPITAL_COMMUNITY)
Admission: AD | Admit: 2017-07-12 | Discharge: 2017-07-12 | Disposition: A | Discharge status: Home / Self Care | Payer: 59 | Source: Ambulatory Visit | Attending: Obstetrics and Gynecology | Admitting: Obstetrics and Gynecology

## 2017-07-12 ENCOUNTER — Encounter (HOSPITAL_COMMUNITY): Payer: Self-pay

## 2017-07-12 ENCOUNTER — Telehealth: Payer: Self-pay | Admitting: General Practice

## 2017-07-12 DIAGNOSIS — R51 Headache: Secondary | ICD-10-CM | POA: Diagnosis present

## 2017-07-12 DIAGNOSIS — R1011 Right upper quadrant pain: Secondary | ICD-10-CM

## 2017-07-12 DIAGNOSIS — R7303 Prediabetes: Secondary | ICD-10-CM | POA: Diagnosis not present

## 2017-07-12 DIAGNOSIS — R42 Dizziness and giddiness: Secondary | ICD-10-CM | POA: Insufficient documentation

## 2017-07-12 DIAGNOSIS — M940 Chondrocostal junction syndrome [Tietze]: Secondary | ICD-10-CM | POA: Insufficient documentation

## 2017-07-12 DIAGNOSIS — O9089 Other complications of the puerperium, not elsewhere classified: Secondary | ICD-10-CM | POA: Insufficient documentation

## 2017-07-12 DIAGNOSIS — R519 Headache, unspecified: Secondary | ICD-10-CM

## 2017-07-12 LAB — URINALYSIS, ROUTINE W REFLEX MICROSCOPIC
BILIRUBIN URINE: NEGATIVE
Glucose, UA: NEGATIVE mg/dL
Ketones, ur: NEGATIVE mg/dL
Nitrite: NEGATIVE
PROTEIN: NEGATIVE mg/dL
Specific Gravity, Urine: 1.014 (ref 1.005–1.030)
pH: 6 (ref 5.0–8.0)

## 2017-07-12 LAB — COMPREHENSIVE METABOLIC PANEL
ALBUMIN: 3.3 g/dL — AB (ref 3.5–5.0)
ALT: 21 U/L (ref 14–54)
AST: 23 U/L (ref 15–41)
Alkaline Phosphatase: 93 U/L (ref 38–126)
Anion gap: 8 (ref 5–15)
BUN: 11 mg/dL (ref 6–20)
CHLORIDE: 104 mmol/L (ref 101–111)
CO2: 25 mmol/L (ref 22–32)
CREATININE: 0.79 mg/dL (ref 0.44–1.00)
Calcium: 9.1 mg/dL (ref 8.9–10.3)
GFR calc Af Amer: >60 mL/min (ref >60)
GFR calc non Af Amer: >60 mL/min (ref >60)
GLUCOSE: 90 mg/dL (ref 65–99)
Potassium: 4.3 mmol/L (ref 3.5–5.1)
SODIUM: 137 mmol/L (ref 135–145)
Total Bilirubin: 0.8 mg/dL (ref 0.3–1.2)
Total Protein: 6.8 g/dL (ref 6.5–8.1)

## 2017-07-12 LAB — PROTEIN / CREATININE RATIO, URINE
CREATININE, URINE: 174 mg/dL
PROTEIN CREATININE RATIO: 0.06 mg/mg{creat} (ref 0.00–0.15)
TOTAL PROTEIN, URINE: 11 mg/dL

## 2017-07-12 LAB — LIPASE, BLOOD: LIPASE: 42 U/L (ref 11–51)

## 2017-07-12 LAB — CBC
HCT: 32.7 % — ABNORMAL LOW (ref 36.0–46.0)
Hemoglobin: 10.3 g/dL — ABNORMAL LOW (ref 12.0–15.0)
MCH: 26.3 pg (ref 26.0–34.0)
MCHC: 31.5 g/dL (ref 30.0–36.0)
MCV: 83.6 fL (ref 78.0–100.0)
PLATELETS: 611 10*3/uL — AB (ref 150–400)
RBC: 3.91 MIL/uL (ref 3.87–5.11)
RDW: 14.6 % (ref 11.5–15.5)
WBC: 5.2 10*3/uL (ref 4.0–10.5)

## 2017-07-12 MED ORDER — IBUPROFEN 600 MG PO TABS
600.0000 mg | ORAL_TABLET | Freq: Once | ORAL | Status: AC
  Administered 2017-07-12: 600 mg via ORAL
  Filled 2017-07-12: qty 1

## 2017-07-12 MED ORDER — IBUPROFEN 600 MG PO TABS: 600.0000 mg | ORAL_TABLET | Freq: Four times a day (QID) | ORAL | 0 refills | Status: DC | PRN

## 2017-07-12 MED ORDER — GI COCKTAIL ~~LOC~~
30.0000 mL | Freq: Once | ORAL | Status: AC
  Administered 2017-07-12: 30 mL via ORAL
  Filled 2017-07-12: qty 30

## 2017-07-12 NOTE — Telephone Encounter (Signed)
Patient called and left message on nurse line stating she delivered 2 weeks ago and is having severe headaches and severe right upper quadrant pain. Patient states things have only got worse recently and she isn't sure what to do. Per chart review patient had MAU visit on 1/18 and was started on medication. Called patient & recommended she follow back up with MAU. Patient verbalized understanding to all & had no questions

## 2017-07-12 NOTE — Discharge Instructions (Signed)
Drink 64 ounces of water daily. Eat a healthy diet with protein every time you eat. Keep taking your Norvasc. Keep your appointment on Friday. Get your ibuprofen at the pharmacy and take as needed for headache and pain in your ribs. Return sooner if you have a severe headache with visual changes. Today your blood pressure and your labs are normal with your medication.

## 2017-07-12 NOTE — Telephone Encounter (Signed)
Called and provided needed information.

## 2017-07-12 NOTE — MAU Note (Signed)
G2P1 s/p PCS on 06/28/2017.  Pt has h/o Pre-ecclampsia and was on magnesium post delivery.  Pt has been seen by MD post delivery and is currently on Norvask.  Pt is c/o increased HA and RUQ pain, dizziness, and blurry vision.

## 2017-07-12 NOTE — MAU Provider Note (Signed)
History     CSN: [id number]  Arrival date and time: 07/12/17 1447   First Provider Initiated Contact with Patient 07/12/17 1526   Evaluation and management procedures were performed by student the under my supervision and collaboration. I have examined the patient independently and reviewed the note and chart.  Chief Complaint  Patient presents with  . Headache  . RUQ pain   HPI Cheyenne Lozano is a 27yo G2P1 female with recent IOL 2 weeks ago for gHTN, delivered via pLTCS on 06/28/17 due to failure to progress. Also treated for Triple I (ABX) and PreE with Mg for severe features (HA, RUQ pain, elevated Cr). She was discharged 3 days later and followed up in 1 week for BP check where it was mildly elevated (143/94) and had unresolved headache and RUQ pain. Sent to MAU for further workup (PreE labs normal) and started on 10mg  Norvasc. Patient returns to MAU today without relief of HA or RUQ pain. She has tried tylenol 600 TID with minimal relief. RUQ pain is constant and achey, located "behind her ribs" with nothing making it specifically better or worse. Endorses occasional dizziness throughout the day despite adequate hydration and nutrition. Denies vision changes, difficulty breathing, chest palpitations, syncope, swelling, dysuria, nausea, vomiting, vaginal discharge/bleed. Further denies fever, chills, body aches.   OB History    Gravida Para Term Preterm AB Living   2 1 1   1 1    SAB TAB Ectopic Multiple Live Births   1     0 1      Past Medical History:  Diagnosis Date  . Breast hypertrophy in female 02/26/2015  . Hypertension    Pre-ecclampsia  . Type II diabetes mellitus (HCC)    "actually I'm prediabetic; dr. put me on RX to get A1C #'s down" (02/26/2015)    Past Surgical History:  Procedure Laterality Date  . BREAST REDUCTION SURGERY Bilateral 02/26/2015   Procedure: BILATERAL BREAST REDUCTION  ;  Surgeon: Etter Sjogren, MD;  Location: [facility] OR;  Service: Plastics;  Laterality:  Bilateral;  . CESAREAN SECTION N/A 06/28/2017   Procedure: CESAREAN SECTION;  Surgeon: Kathrynn Running, MD;  Location: [facility] BIRTHING SUITES;  Service: Obstetrics;  Laterality: N/A;  . REDUCTION MAMMAPLASTY Bilateral 02/26/2015  . WISDOM TOOTH EXTRACTION  ~ 2010   "bottom 2"    Family History  Problem Relation Age of Onset  . Diabetes Father   . Hypertension Father   . Diabetes Maternal Grandmother   . Hypertension Maternal Grandmother   . Diabetes Maternal Grandfather   . Hypertension Maternal Grandfather     Social History   Tobacco Use  . Smoking status: Never Smoker  . Smokeless tobacco: Never Used  Substance Use Topics  . Alcohol use: Yes    Comment: not since pregnancy  . Drug use: No    Allergies: No Known Allergies  Medications Prior to Admission  Medication Sig Dispense Refill Last Dose  . acetaminophen (TYLENOL) 500 MG tablet Take 2 tablets (1,000 mg total) by mouth every 6 (six) hours as needed. 30 tablet 0   . amLODipine (NORVASC) 10 MG tablet Take 1 tablet (10 mg total) by mouth daily. 30 tablet 0   . docusate sodium (COLACE) 100 MG capsule Take 100 mg by mouth 2 (two) times daily.   06/26/2017 at Unknown time  . ferrous sulfate (FERROUSUL) 325 (65 FE) MG tablet Take 1 tablet (325 mg total) 2 (two) times daily by mouth. 60 tablet 1 06/26/2017 at  Unknown time  . ondansetron (ZOFRAN-ODT) 4 MG disintegrating tablet Take 1 tablet (4 mg total) by mouth every 8 (eight) hours as needed for nausea or vomiting. 20 tablet 3 06/25/2017 at Unknown time  . Prenatal Vit-Fe Fumarate-FA (PRENATAL MULTIVITAMIN) TABS tablet Take 1 tablet by mouth daily at 12 noon.   06/25/2017 at Unknown time    Review of Systems  Constitutional: Positive for fatigue. Negative for chills, diaphoresis, fever and unexpected weight change.  Respiratory: Negative for cough, shortness of breath, wheezing and stridor.   Cardiovascular: Negative for chest pain, palpitations and leg swelling.  Gastrointestinal:  Positive for abdominal pain (RUQ). Negative for abdominal distention, blood in stool, constipation, diarrhea, nausea and vomiting.  Genitourinary: Negative for dysuria, flank pain, frequency, urgency, vaginal bleeding, vaginal discharge and vaginal pain.  Musculoskeletal: Negative for arthralgias and myalgias.  Neurological: Positive for dizziness. Negative for tremors, seizures, syncope, weakness and light-headedness.  Hematological: Does not bruise/bleed easily.  All other systems reviewed and are negative.  Physical Exam   Blood pressure 111/74, pulse 77, temperature 98.8 F (37.1 C), temperature source Oral, resp. rate 18, unknown if currently breastfeeding.  Today's Vitals   07/12/17 1508 07/12/17 1513 07/12/17 1530  BP:  111/74 112/78  Pulse:  77 81  Resp: 18    Temp: 98.8 F (37.1 C)    TempSrc: Oral    PainSc: 8       Physical Exam  Constitutional: She is oriented to person, place, and time. She appears well-developed and well-nourished. No distress.  HENT:  Head: Normocephalic and atraumatic.  Eyes: EOM are normal. Pupils are equal, round, and reactive to light.  Neck: Normal range of motion. Neck supple.  Cardiovascular: Normal rate, regular rhythm and normal heart sounds. Exam reveals no gallop and no friction rub.  No murmur heard. Respiratory: Breath sounds normal. No respiratory distress. She has no wheezes. She has no rales. She exhibits tenderness (lower border of R ribcage).  GI: Soft. Bowel sounds are normal. She exhibits no distension and no mass. There is tenderness (RUQ tenderness along the liver edge. No hepatomegaly.). There is no rebound and no guarding.  With reexam, client is tender only when pressing directly onto ribs or between the ribs.  Does not have the same pain if pressing on tissue below the ribs.  Musculoskeletal: She exhibits no edema.  Neurological: She is alert and oriented to person, place, and time.  Skin: Skin is warm and dry. No rash  noted. She is not diaphoretic. No erythema.  Psychiatric: She has a normal mood and affect. Her behavior is normal. Judgment and thought content normal.    Results for orders placed or performed during the hospital encounter of 07/12/17 (from the past 24 hour(s))  Protein / creatinine ratio, urine     Status: None   Collection Time: 07/12/17  3:30 PM  Result Value Ref Range   Creatinine, Urine 174.00 mg/dL   Total Protein, Urine 11 mg/dL   Protein Creatinine Ratio 0.06 0.00 - 0.15 mg/mg[Cre]  Urinalysis, Routine w reflex microscopic     Status: Abnormal   Collection Time: 07/12/17  3:30 PM  Result Value Ref Range   Color, Urine YELLOW YELLOW   APPearance CLEAR CLEAR   Specific Gravity, Urine 1.014 1.005 - 1.030   pH 6.0 5.0 - 8.0   Glucose, UA NEGATIVE NEGATIVE mg/dL   Hgb urine dipstick LARGE (A) NEGATIVE   Bilirubin Urine NEGATIVE NEGATIVE   Ketones, ur NEGATIVE NEGATIVE mg/dL  Protein, ur NEGATIVE NEGATIVE mg/dL   Nitrite NEGATIVE NEGATIVE   Leukocytes, UA TRACE (A) NEGATIVE   RBC / HPF 0-5 0 - 5 RBC/hpf   WBC, UA 0-5 0 - 5 WBC/hpf   Bacteria, UA RARE (A) NONE SEEN   Squamous Epithelial / LPF 0-5 (A) NONE SEEN   Mucus PRESENT   CBC     Status: Abnormal   Collection Time: 07/12/17  3:42 PM  Result Value Ref Range   WBC 5.2 4.0 - 10.5 K/uL   RBC 3.91 3.87 - 5.11 MIL/uL   Hemoglobin 10.3 (L) 12.0 - 15.0 g/dL   HCT 96.0 (L) 45.4 - 09.8 %   MCV 83.6 78.0 - 100.0 fL   MCH 26.3 26.0 - 34.0 pg   MCHC 31.5 30.0 - 36.0 g/dL   RDW 11.9 14.7 - 82.9 %   Platelets 611 (H) 150 - 400 K/uL  Comprehensive metabolic panel     Status: Abnormal   Collection Time: 07/12/17  3:42 PM  Result Value Ref Range   Sodium 137 135 - 145 mmol/L   Potassium 4.3 3.5 - 5.1 mmol/L   Chloride 104 101 - 111 mmol/L   CO2 25 22 - 32 mmol/L   Glucose, Bld 90 65 - 99 mg/dL   BUN 11 6 - 20 mg/dL   Creatinine, Ser 5.62 0.44 - 1.00 mg/dL   Calcium 9.1 8.9 - 13.0 mg/dL   Total Protein 6.8 6.5 - 8.1 g/dL    Albumin 3.3 (L) 3.5 - 5.0 g/dL   AST 23 15 - 41 U/L   ALT 21 14 - 54 U/L   Alkaline Phosphatase 93 38 - 126 U/L   Total Bilirubin 0.8 0.3 - 1.2 mg/dL   GFR calc non Af Amer >60 >60 mL/min   GFR calc Af Amer >60 >60 mL/min   Anion gap 8 5 - 15    MAU Course  Procedures RUQ limited US for gall bladder pathology  MDM MSE Exam Labs: CBC, CMP, UA, P/C ratio, Lipase Meds: - GI cocktail given with no immediate relief - IBU 600mg  PO With history of preeclampsia, need to make sure the RUQ pain she is having is not indicative of a worsening preeclampsia.  Blood pressure is normal while here and she is taking the prescribed Norvasc.  Labs are normal.  RUQ pain did not respond to GI cocktail which indicates the pain is less likely to be GI in origin.  RUQ ultrasound confirms there is no gall bladder involvement with this pain.  Repeated physical exam shows the RUQ pain is only when the ribs are palpated.  Palpation of the soft tissue under the ribs does not elicit this pain.  So the likely cause is costochondritis and not a life threatening problem.  Her headache did respond to the ibuprofen, so will prescribe ibuprofen which she can use for headaches and to treat the RUQ pain as needed.  Discussed with her that costochondritis may take several weeks to resolve.  And has an appointment on Friday so she can be reevaluated in case her pain is worsening.  Discussed all of this with the client.  Assessment and Plan  Postpartum with C/S delivery on 06-26-17 following failed induction for gestational hypertension and complicated by severe preeclampsia. Costochondritis Headache - blurred vision has resolved while in MAU and headache is greatly lessened but not entirely resolved with ibuprofen therapy Dizziness - will continue to monitor as she has started Norvasc and has normal BP  today  Plan Drink 64 ounces of water daily. Eat a healthy diet with protein every time you eat. Keep taking your  Norvasc. Keep your appointment on Friday. Get your ibuprofen at the pharmacy and take as needed for headache and pain in your ribs. Return sooner if you have a severe headache with visual changes. Today your blood pressure and your labs are normal with your medication.  Asa Saunas 07/12/2017, 3:42 PM   Nolene Bernheim, RN, MSN, NP-BC Nurse Practitioner, [facility] for [employer], [facility] 07/12/2017 7:47 PM

## 2017-07-25 ENCOUNTER — Encounter: Payer: Self-pay | Admitting: Family Medicine

## 2017-07-25 ENCOUNTER — Ambulatory Visit (INDEPENDENT_AMBULATORY_CARE_PROVIDER_SITE_OTHER): Payer: 59 | Admitting: Family Medicine

## 2017-07-25 DIAGNOSIS — Z1389 Encounter for screening for other disorder: Secondary | ICD-10-CM | POA: Diagnosis not present

## 2017-07-25 LAB — POCT PREGNANCY, URINE: Preg Test, Ur: NEGATIVE

## 2017-07-25 MED ORDER — NORETHINDRONE 0.35 MG PO TABS: 1.0000 | ORAL_TABLET | Freq: Every day | ORAL | 11 refills | Status: DC

## 2017-07-25 NOTE — Progress Notes (Signed)
Subjective:     Cheyenne Lozano is a 27 y.o. female who presents for a postpartum visit. She is 4 weeks postpartum following a low cervical transverse Cesarean section. I have fully reviewed the prenatal and intrapartum course. The delivery was at 38 gestational weeks. Outcome: c-section. Anesthesia: spinal. Postpartum course has been normal. Baby's course has been normal. Baby is feeding by bottle - Similac Advance. Bleeding no bleeding. Bowel function is normal. Bladder function is normal. Patient is sexually active. Contraception method is progesterone pill. Postpartum depression screening: negative.  Review of Systems Pertinent items are noted in HPI.   Objective:    BP 109/76   Pulse 74   Wt 136 lb (61.7 kg)   Breastfeeding? No   BMI 27.47 kg/m   General:  alert, cooperative and appears stated age   Breasts:  inspection negative, no nipple discharge or bleeding, no masses or nodularity palpable  Lungs: clear to auscultation bilaterally  Heart:  regular rate and rhythm, S1, S2 normal, no murmur, click, rub or gallop  Abdomen: soft, non-tender; bowel sounds normal; no masses,  no organomegaly  Psych      Normal mood, no depression Assessment:     4 postpartum exam. Pap smear not done at today's visit.   Plan:    1. Contraception: progresterone only pill 2. Blood pressure normal. Has not been taking norvasc for 1 week. Do not continue norvasc 3. Follow up as needed.

## 2017-08-03 ENCOUNTER — Telehealth: Payer: Self-pay | Admitting: *Deleted

## 2017-08-03 NOTE — Telephone Encounter (Signed)
Carmon left a  voicemessage at 12:21 today stating she saw Dr. Earlene Plateravis for her postpartum check a week ago and has started her first period. States it is extremely heavy and having lots of cramps . Taking ibuprofen with not much relief. Wants a call.

## 2017-08-14 NOTE — Telephone Encounter (Signed)
Still bleeding- changing pad every 2-3 hours. Is on 3rd week, but states she stopped the pills for a while because she started her period. Then just started taking week 2 of  Her pills. Advised her that may be why she is bleeding now and that she should not stop taking pills. Explained her bleeding should stop soon- hopefully when she is on her 2nd pack bleeding should be better. Advised her to call us if she continues to have a problem. She voices understanding.

## 2017-10-16 ENCOUNTER — Encounter: Payer: Self-pay | Admitting: Obstetrics & Gynecology

## 2017-10-16 ENCOUNTER — Ambulatory Visit (INDEPENDENT_AMBULATORY_CARE_PROVIDER_SITE_OTHER): Payer: 59 | Admitting: Obstetrics & Gynecology

## 2017-10-16 VITALS — BP 125/70 | HR 57 | Wt 151.5 lb

## 2017-10-16 DIAGNOSIS — N938 Other specified abnormal uterine and vaginal bleeding: Secondary | ICD-10-CM

## 2017-10-16 MED ORDER — DICLOFENAC SODIUM 75 MG PO TBEC: 75.0000 mg | DELAYED_RELEASE_TABLET | Freq: Two times a day (BID) | ORAL | 2 refills | Status: DC

## 2017-10-16 MED ORDER — NORGESTIMATE-ETH ESTRADIOL 0.25-35 MG-MCG PO TABS: 1.0000 | ORAL_TABLET | Freq: Every day | ORAL | 11 refills | Status: DC

## 2017-10-16 NOTE — Progress Notes (Signed)
Subjective:daily abnormal vaginal bleeding     Cheyenne Lozano is a 27 y.o. female who presents for a postpartum visit. She is 3 months postpartum following a low cervical transverse Cesarean section. I have fully reviewed the prenatal and intrapartum course. The delivery was at 38.4 gestational weeks. Outcome: primary cesarean section, low transverse incision. Anesthesia: epidural. Postpartum course has been complicated by abnormal vaginal bleeding and cramps. Baby's course has been good. Baby is feeding by bottle -  . Bleeding thick, heavy lochia, red and clots. Bowel function is normal. Bladder function is normal. Patient is sexually active. Contraception method is oral progesterone-only contraceptive. Postpartum depression screening: negative.  The following portions of the patient's history were reviewed and updated as appropriate: allergies, current medications, past family history, past medical history, past social history, past surgical history and problem list.  Review of Systems Pertinent items are noted in HPI.   Objective:    BP 125/70   Pulse (!) 57   Wt 151 lb 8 oz (68.7 kg)   LMP 10/02/2017 (Exact Date)   BMI 30.60 kg/m   General:  alert, cooperative and no distress        Heart:     Abdomen: soft, non-tender; bowel sounds normal; no masses,  no organomegaly and incision well healed   Vulva:  normal  Vagina: normal vagina  Cervix:  no lesions  Corpus: normal size, contour, position, consistency, mobility, non-tender  Adnexa:  normal adnexa  Rectal Exam: Not performed.       Blood in vagina Assessment:     3.5 month postpartum exam. Pap smear not done at today's visit.   Plan:    1. Contraception: OCP (estrogen/progesterone)  Current Outpatient Medications on File Prior to Visit  Medication Sig Dispense Refill  . acetaminophen (TYLENOL) 500 MG tablet Take 2 tablets (1,000 mg total) by mouth every 6 (six) hours as needed. 30 tablet 0  . norethindrone  (MICRONOR,CAMILA,ERRIN) 0.35 MG tablet Take 1 tablet (0.35 mg total) by mouth daily. 1 Package 11  . amLODipine (NORVASC) 10 MG tablet Take 1 tablet (10 mg total) by mouth daily. (Patient not taking: Reported on 07/25/2017) 30 tablet 0  . ibuprofen (ADVIL,MOTRIN) 600 MG tablet Take 1 tablet (600 mg total) by mouth every 6 (six) hours as needed. Take with food. 30 tablet 0   No current facility-administered medications on file prior to visit.     2. Stop her POP. Voltaren for pain. CBC and coags today 3. Follow up in: 4 months or as needed.

## 2017-10-16 NOTE — Patient Instructions (Signed)

## 2017-10-17 LAB — CBC
Hematocrit: 34.6 % (ref 34.0–46.6)
Hemoglobin: 10.7 g/dL — ABNORMAL LOW (ref 11.1–15.9)
MCH: 26 pg — AB (ref 26.6–33.0)
MCHC: 30.9 g/dL — AB (ref 31.5–35.7)
MCV: 84 fL (ref 79–97)
PLATELETS: 408 10*3/uL — AB (ref 150–379)
RBC: 4.12 x10E6/uL (ref 3.77–5.28)
RDW: 15 % (ref 12.3–15.4)
WBC: 6.7 10*3/uL (ref 3.4–10.8)

## 2017-10-17 LAB — PROTIME-INR
INR: 1 (ref 0.8–1.2)
Prothrombin Time: 10.6 s (ref 9.1–12.0)

## 2017-10-17 LAB — APTT: aPTT: 29 s (ref 24–33)

## 2017-11-15 ENCOUNTER — Ambulatory Visit (HOSPITAL_COMMUNITY)
Admission: EM | Admit: 2017-11-15 | Discharge: 2017-11-15 | Disposition: A | Discharge status: Home / Self Care | Payer: 59 | Attending: Family Medicine | Admitting: Family Medicine

## 2017-11-15 ENCOUNTER — Encounter (HOSPITAL_COMMUNITY): Payer: Self-pay | Admitting: Emergency Medicine

## 2017-11-15 DIAGNOSIS — R05 Cough: Secondary | ICD-10-CM | POA: Diagnosis not present

## 2017-11-15 DIAGNOSIS — Z8249 Family history of ischemic heart disease and other diseases of the circulatory system: Secondary | ICD-10-CM | POA: Insufficient documentation

## 2017-11-15 DIAGNOSIS — R110 Nausea: Secondary | ICD-10-CM | POA: Insufficient documentation

## 2017-11-15 DIAGNOSIS — B9789 Other viral agents as the cause of diseases classified elsewhere: Secondary | ICD-10-CM

## 2017-11-15 DIAGNOSIS — Z79899 Other long term (current) drug therapy: Secondary | ICD-10-CM | POA: Diagnosis not present

## 2017-11-15 DIAGNOSIS — R509 Fever, unspecified: Secondary | ICD-10-CM

## 2017-11-15 DIAGNOSIS — J029 Acute pharyngitis, unspecified: Secondary | ICD-10-CM | POA: Diagnosis not present

## 2017-11-15 DIAGNOSIS — I10 Essential (primary) hypertension: Secondary | ICD-10-CM | POA: Diagnosis not present

## 2017-11-15 DIAGNOSIS — J069 Acute upper respiratory infection, unspecified: Secondary | ICD-10-CM | POA: Insufficient documentation

## 2017-11-15 DIAGNOSIS — E119 Type 2 diabetes mellitus without complications: Secondary | ICD-10-CM | POA: Insufficient documentation

## 2017-11-15 LAB — POCT RAPID STREP A: STREPTOCOCCUS, GROUP A SCREEN (DIRECT): NEGATIVE

## 2017-11-15 MED ORDER — IBUPROFEN 800 MG PO TABS
800.0000 mg | ORAL_TABLET | Freq: Three times a day (TID) | ORAL | 0 refills | Status: DC
Start: 2017-11-15 — End: 2019-04-05

## 2017-11-15 MED ORDER — ONDANSETRON 4 MG PO TBDP: 4.0000 mg | ORAL_TABLET | Freq: Three times a day (TID) | ORAL | 0 refills | Status: DC | PRN

## 2017-11-15 MED ORDER — CETIRIZINE HCL 10 MG PO CAPS: 10.0000 mg | ORAL_CAPSULE | Freq: Every day | ORAL | 0 refills | Status: DC

## 2017-11-15 MED ORDER — PSEUDOEPH-BROMPHEN-DM 30-2-10 MG/5ML PO SYRP: 5.0000 mL | ORAL_SOLUTION | Freq: Four times a day (QID) | ORAL | 0 refills | Status: DC | PRN

## 2017-11-15 MED ORDER — FLUTICASONE PROPIONATE 50 MCG/ACT NA SUSP: 1.0000 | Freq: Every day | NASAL | 0 refills | Status: DC

## 2017-11-15 NOTE — Discharge Instructions (Addendum)
Strep test is negative, symptoms likely viral and should resolve on their own in approximately 5 to 7 days.  Please continue symptomatic management.  For congestion please begin daily allergy pill, absent Zyrtec for you, but you may also get this over-the-counter and get strep brand.  Please also use Flonase nasal spray, 1 to 2 sprays in each nostril daily.  Please use cough syrup provided or you may continue Robitussin-DM  Please use Tylenol and ibuprofen for further fevers, body aches, sore throat and headache.  Zofran as needed for nausea  Please return if symptoms worsening, developing shortness of breath, chest discomfort, symptoms not improving in 5 to 7 days.

## 2017-11-15 NOTE — ED Triage Notes (Signed)
Pt c/o fever, chills, coughing, sneezing, body aches x3 days. Has been taking OTC meds without relief.

## 2017-11-16 NOTE — ED Provider Notes (Signed)
[facility]    CSN: [id number] Arrival date & time: 11/15/17  1750     History   Chief Complaint Chief Complaint  Patient presents with  . Fever  . Generalized Body Aches    HPI Cheyenne Lozano is a 27 y.o. female Patient is presenting with URI symptoms- congestion, cough, sore throat.  Also endorsing fever of 101 earlier today as well as mild nausea.  Patient's main complaints are overall feeling poor. Symptoms have been going on for 3 days. Patient has tried Tylenol and Robitussin-DM, with minimal relief. Denies vomiting, diarrhea. Denies shortness of breath and chest pain.    HPI  Past Medical History:  Diagnosis Date  . Breast hypertrophy in female 02/26/2015  . Hypertension    Pre-ecclampsia  . Type II diabetes mellitus (HCC)    "actually I'm prediabetic; dr. put me on RX to get A1C #'s down" (02/26/2015)    Patient Active Problem List   Diagnosis Date Noted  . Postpartum care and examination 07/25/2017    Past Surgical History:  Procedure Laterality Date  . BREAST REDUCTION SURGERY Bilateral 02/26/2015   Procedure: BILATERAL BREAST REDUCTION  ;  Surgeon: Etter Sjogren, MD;  Location: [facility] OR;  Service: Plastics;  Laterality: Bilateral;  . CESAREAN SECTION N/A 06/28/2017   Procedure: CESAREAN SECTION;  Surgeon: Kathrynn Running, MD;  Location: [facility] BIRTHING SUITES;  Service: Obstetrics;  Laterality: N/A;  . REDUCTION MAMMAPLASTY Bilateral 02/26/2015  . WISDOM TOOTH EXTRACTION  ~ 2010   "bottom 2"    OB History    Gravida  2   Para  1   Term  1   Preterm      AB  1   Living  1     SAB  1   TAB      Ectopic      Multiple  0   Live Births  1            Home Medications    Prior to Admission medications   Medication Sig Start Date End Date Taking? Authorizing Provider  brompheniramine-pseudoephedrine-DM 30-2-10 MG/5ML syrup Take 5 mLs by mouth 4 (four) times daily as needed. 11/15/17   Afomia Blackley C, PA-C  Cetirizine HCl 10 MG CAPS  Take 1 capsule (10 mg total) by mouth daily for 10 days. 11/15/17 11/25/17  Yerick Eggebrecht C, PA-C  fluticasone (FLONASE) 50 MCG/ACT nasal spray Place 1-2 sprays into both nostrils daily for 7 days. 11/15/17 11/22/17  Charlese Gruetzmacher C, PA-C  ibuprofen (ADVIL,MOTRIN) 800 MG tablet Take 1 tablet (800 mg total) by mouth 3 (three) times daily. 11/15/17   Girlie Veltri C, PA-C  norgestimate-ethinyl estradiol (ORTHO-CYCLEN,SPRINTEC,PREVIFEM) 0.25-35 MG-MCG tablet Take 1 tablet by mouth daily. 10/16/17   Adam Phenix, MD  ondansetron (ZOFRAN ODT) 4 MG disintegrating tablet Take 1 tablet (4 mg total) by mouth every 8 (eight) hours as needed for nausea or vomiting. 11/15/17   Messiah Ahr, Junius Creamer, PA-C    Family History Family History  Problem Relation Age of Onset  . Diabetes Father   . Hypertension Father   . Diabetes Maternal Grandmother   . Hypertension Maternal Grandmother   . Diabetes Maternal Grandfather   . Hypertension Maternal Grandfather     Social History Social History   Tobacco Use  . Smoking status: Never Smoker  . Smokeless tobacco: Never Used  Substance Use Topics  . Alcohol use: Not Currently    Comment: not since pregnancy  .  Drug use: No     Allergies   Patient has no known allergies.   Review of Systems Review of Systems  Constitutional: Positive for fatigue and fever. Negative for chills.  HENT: Positive for congestion, rhinorrhea and sore throat. Negative for ear pain, sinus pressure and trouble swallowing.   Respiratory: Positive for cough. Negative for chest tightness and shortness of breath.   Cardiovascular: Negative for chest pain.  Gastrointestinal: Positive for nausea. Negative for abdominal pain and vomiting.  Musculoskeletal: Negative for myalgias.  Skin: Negative for rash.  Neurological: Negative for dizziness, light-headedness and headaches.     Physical Exam Triage Vital Signs ED Triage Vitals [11/15/17 1759]  Enc Vitals Group     BP 134/73      Pulse Rate 83     Resp 18     Temp 99.4 F (37.4 C)     Temp src      SpO2 100 %     Weight      Height      Head Circumference      Peak Flow      Pain Score      Pain Loc      Pain Edu?      Excl. in GC?    No data found.  Updated Vital Signs BP 134/73   Pulse 83   Temp 99.4 F (37.4 C)   Resp 18   LMP 11/13/2017   SpO2 100%   Visual Acuity Right Eye Distance:   Left Eye Distance:   Bilateral Distance:    Right Eye Near:   Left Eye Near:    Bilateral Near:     Physical Exam  Constitutional: She appears well-developed and well-nourished. No distress.  Sitting comfortably on chair, sounding congested  HENT:  Head: Normocephalic and atraumatic.  Bilateral ears without tenderness to palpation of external auricle, tragus and mastoid, EAC's without erythema or swelling, TM's with good bony landmarks and cone of light. Non erythematous.  Oral mucosa pink and moist, no tonsillar enlargement or exudate. Posterior pharynx patent and nonerythematous, no uvula deviation or swelling. Normal phonation.  Eyes: Conjunctivae are normal.  Neck: Neck supple.  Cardiovascular: Normal rate and regular rhythm.  No murmur heard. Pulmonary/Chest: Effort normal and breath sounds normal. No respiratory distress.  Breathing comfortably at rest, CTABL, no wheezing, rales or other adventitious sounds auscultated  Abdominal: Soft. There is no tenderness.  Musculoskeletal: She exhibits no edema.  Neurological: She is alert.  Skin: Skin is warm and dry.  Psychiatric: She has a normal mood and affect.  Nursing note and vitals reviewed.    UC Treatments / Results  Labs (all labs ordered are listed, but only abnormal results are displayed) Labs Reviewed  CULTURE, GROUP A STREP [facility])  POCT RAPID STREP A    EKG None  Radiology No results found.  Procedures Procedures (including critical care time)  Medications Ordered in UC Medications - No data to display  Initial  Impression / Assessment and Plan / UC Course  I have reviewed the triage vital signs and the nursing notes.  Pertinent labs & imaging results that were available during my care of the patient were reviewed by me and considered in my medical decision making (see chart for details).     Strep test negative, vital signs stable, exam unremarkable.  URI symptoms likely viral etiology.  Will recommend to continue symptomatic management at this time.  Discussed OTC measures to better control symptoms.  Expect  persistence of symptoms for approximately 5 to 7 days followed by gradual improvement.  Recommendations below.Discussed strict return precautions. Patient verbalized understanding and is agreeable with plan.  Final Clinical Impressions(s) / UC Diagnoses   Final diagnoses:  Viral URI with cough     Discharge Instructions     Strep test is negative, symptoms likely viral and should resolve on their own in approximately 5 to 7 days.  Please continue symptomatic management.  For congestion please begin daily allergy pill, absent Zyrtec for you, but you may also get this over-the-counter and get strep brand.  Please also use Flonase nasal spray, 1 to 2 sprays in each nostril daily.  Please use cough syrup provided or you may continue Robitussin-DM  Please use Tylenol and ibuprofen for further fevers, body aches, sore throat and headache.  Zofran as needed for nausea  Please return if symptoms worsening, developing shortness of breath, chest discomfort, symptoms not improving in 5 to 7 days.    ED Prescriptions    Medication Sig Dispense Auth. Provider   fluticasone (FLONASE) 50 MCG/ACT nasal spray Place 1-2 sprays into both nostrils daily for 7 days. 1 g Cheikh Bramble C, PA-C   Cetirizine HCl 10 MG CAPS Take 1 capsule (10 mg total) by mouth daily for 10 days. 10 capsule Kahla Risdon C, PA-C   brompheniramine-pseudoephedrine-DM 30-2-10 MG/5ML syrup Take 5 mLs by mouth 4 (four) times  daily as needed. 120 mL Norell Brisbin C, PA-C   ibuprofen (ADVIL,MOTRIN) 800 MG tablet Take 1 tablet (800 mg total) by mouth 3 (three) times daily. 21 tablet Grayer Sproles C, PA-C   ondansetron (ZOFRAN ODT) 4 MG disintegrating tablet Take 1 tablet (4 mg total) by mouth every 8 (eight) hours as needed for nausea or vomiting. 20 tablet Tamanika Heiney, [place] C, PA-C     Controlled Substance Prescriptions [place] Controlled Substance Registry consulted? Not Applicable   Lew Dawes, [place] 11/16/17 1610

## 2017-11-18 LAB — CULTURE, GROUP A STREP (THRC)

## 2018-09-12 DIAGNOSIS — E282 Polycystic ovarian syndrome: Secondary | ICD-10-CM | POA: Insufficient documentation

## 2019-04-05 ENCOUNTER — Other Ambulatory Visit: Payer: Self-pay

## 2019-04-05 ENCOUNTER — Encounter: Payer: Self-pay | Admitting: Nurse Practitioner

## 2019-04-05 ENCOUNTER — Ambulatory Visit (INDEPENDENT_AMBULATORY_CARE_PROVIDER_SITE_OTHER): Payer: 59 | Admitting: Nurse Practitioner

## 2019-04-05 VITALS — BP 110/82 | HR 74 | Temp 97.5°F

## 2019-04-05 DIAGNOSIS — R739 Hyperglycemia, unspecified: Secondary | ICD-10-CM | POA: Diagnosis not present

## 2019-04-05 DIAGNOSIS — R441 Visual hallucinations: Secondary | ICD-10-CM

## 2019-04-05 DIAGNOSIS — Z23 Encounter for immunization: Secondary | ICD-10-CM

## 2019-04-05 DIAGNOSIS — M791 Myalgia, unspecified site: Secondary | ICD-10-CM

## 2019-04-05 DIAGNOSIS — M549 Dorsalgia, unspecified: Secondary | ICD-10-CM | POA: Diagnosis not present

## 2019-04-05 DIAGNOSIS — G8929 Other chronic pain: Secondary | ICD-10-CM

## 2019-04-05 LAB — TSH: TSH: 1.29 u[IU]/mL (ref 0.35–4.50)

## 2019-04-05 LAB — COMPREHENSIVE METABOLIC PANEL
ALT: 17 U/L (ref 0–35)
AST: 18 U/L (ref 0–37)
Albumin: 4.6 g/dL (ref 3.5–5.2)
Alkaline Phosphatase: 74 U/L (ref 39–117)
BUN: 14 mg/dL (ref 6–23)
CO2: 26 mEq/L (ref 19–32)
Calcium: 9.7 mg/dL (ref 8.4–10.5)
Chloride: 102 mEq/L (ref 96–112)
Creatinine, Ser: 0.72 mg/dL (ref 0.40–1.20)
GFR: 118.4 mL/min (ref >60.00)
Glucose, Bld: 83 mg/dL (ref 70–99)
Potassium: 4.1 mEq/L (ref 3.5–5.1)
Sodium: 137 mEq/L (ref 135–145)
Total Bilirubin: 0.6 mg/dL (ref 0.2–1.2)
Total Protein: 7.9 g/dL (ref 6.0–8.3)

## 2019-04-05 LAB — HEMOGLOBIN A1C: Hgb A1c MFr Bld: 6 % (ref 4.6–6.5)

## 2019-04-05 LAB — CBC
HCT: 39.1 % (ref 36.0–46.0)
Hemoglobin: 12.5 g/dL (ref 12.0–15.0)
MCHC: 31.8 g/dL (ref 30.0–36.0)
MCV: 84.3 fl (ref 78.0–100.0)
Platelets: 324 10*3/uL (ref 150.0–400.0)
RBC: 4.65 Mil/uL (ref 3.87–5.11)
RDW: 14.1 % (ref 11.5–15.5)
WBC: 6.2 10*3/uL (ref 4.0–10.5)

## 2019-04-05 MED ORDER — CYCLOBENZAPRINE HCL 5 MG PO TABS: 5.0000 mg | ORAL_TABLET | Freq: Every day | ORAL | 0 refills | Status: DC

## 2019-04-05 MED ORDER — IBUPROFEN 600 MG PO TABS: 600.0000 mg | ORAL_TABLET | Freq: Three times a day (TID) | ORAL | 2 refills | Status: DC | PRN

## 2019-04-05 NOTE — Progress Notes (Signed)
Subjective:  Patient ID: Cheyenne Lozano, female    DOB: 03-Sep-1990  Age: 28 y.o. MRN: [id number]  CC: Establish Care (new pt/back pain consult and schizophrenia consult/ req for flu shot)  Back Pain This is a chronic problem. The current episode started more than 1 year ago. The problem occurs constantly. The problem has been gradually worsening since onset. The pain is present in the thoracic spine. The quality of the pain is described as aching and cramping. The pain does not radiate. The pain is severe. The symptoms are aggravated by bending and standing. Stiffness is present all day. Pertinent negatives include no abdominal pain, chest pain, fever, headaches, numbness, paresis, weakness or weight loss. Risk factors include obesity, poor posture, sedentary lifestyle and lack of exercise (denies any previous injury). She has tried NSAIDs, heat, ice and chiropractic manipulation for the symptoms. The treatment provided mild relief.   Cheyenne Lozano also reports concerns about visual hallucination (frogs, spider webs, people in wheelchair). Onset in Middle school. Denies any episodes of Mania. Hx of anxiety and depression in high school (managed with medication and therapy, discontinued after she graduated), no hx of SI/HI or self harm. No hx of postpartum depression. No ETOH use or illicit drug use.  Hx of gestational DM and hypertension: Resolved after childbirth Up to date with PAP, done by [facility] [place].  Reviewed past Medical, Social and Family history today.  Outpatient Medications Prior to Visit  Medication Sig Dispense Refill  . ibuprofen (ADVIL,MOTRIN) 800 MG tablet Take 1 tablet (800 mg total) by mouth 3 (three) times daily. 21 tablet 0  . brompheniramine-pseudoephedrine-DM 30-2-10 MG/5ML syrup Take 5 mLs by mouth 4 (four) times daily as needed. (Patient not taking: Reported on 04/05/2019) 120 mL 0  . Cetirizine HCl 10 MG CAPS Take 1 capsule (10 mg total) by mouth daily for  10 days. (Patient not taking: Reported on 04/05/2019) 10 capsule 0  . fluticasone (FLONASE) 50 MCG/ACT nasal spray Place 1-2 sprays into both nostrils daily for 7 days. (Patient not taking: Reported on 04/05/2019) 1 g 0  . norgestimate-ethinyl estradiol (ORTHO-CYCLEN,SPRINTEC,PREVIFEM) 0.25-35 MG-MCG tablet Take 1 tablet by mouth daily. (Patient not taking: Reported on 04/05/2019) 1 Package 11  . ondansetron (ZOFRAN ODT) 4 MG disintegrating tablet Take 1 tablet (4 mg total) by mouth every 8 (eight) hours as needed for nausea or vomiting. (Patient not taking: Reported on 04/05/2019) 20 tablet 0   No facility-administered medications prior to visit.    ROS Review of Systems  Constitutional: Negative for fever, malaise/fatigue and weight loss.  Respiratory: Negative for cough and shortness of breath.   Cardiovascular: Negative for chest pain, palpitations, orthopnea and leg swelling.  Gastrointestinal: Negative for abdominal pain and heartburn.  Musculoskeletal: Positive for back pain and neck pain. Negative for falls, joint pain and myalgias.  Skin: Positive for rash.  Neurological: Negative for dizziness, sensory change, focal weakness, weakness, numbness and headaches.  Endo/Heme/Allergies: Negative for polydipsia. Does not bruise/bleed easily.  Psychiatric/Behavioral: Positive for hallucinations. Negative for depression, memory loss, substance abuse and suicidal ideas. The patient is not nervous/anxious and does not have insomnia.    Objective:  BP 110/82   Pulse 74   Temp (!) 97.5 F (36.4 C) (Tympanic)   SpO2 96%   BP Readings from Last 3 Encounters:  04/05/19 110/82  11/15/17 134/73  10/16/17 125/70    Wt Readings from Last 3 Encounters:  10/16/17 151 lb 8 oz (68.7 kg)  07/25/17  136 lb (61.7 kg)  07/07/17 137 lb 0.6 oz (62.2 kg)    Physical Exam Constitutional:      Appearance: She is obese.  Neck:     Musculoskeletal: Normal range of motion and neck supple.   Cardiovascular:     Rate and Rhythm: Normal rate and regular rhythm.     Pulses: Normal pulses.     Heart sounds: Normal heart sounds.  Pulmonary:     Effort: Pulmonary effort is normal.     Breath sounds: Normal breath sounds.  Abdominal:     General: Bowel sounds are normal.     Palpations: Abdomen is soft.  Musculoskeletal:        General: Tenderness present.     Right shoulder: Normal.     Left shoulder: Normal.     Cervical back: She exhibits tenderness. She exhibits no bony tenderness and normal pulse.     Thoracic back: She exhibits tenderness. She exhibits normal range of motion and no bony tenderness.     Right upper arm: Normal.     Left upper arm: Normal.     Right hand: Normal.     Left hand: Normal.     Right lower leg: No edema.     Left lower leg: No edema.  Lymphadenopathy:     Cervical: No cervical adenopathy.  Skin:    General: Skin is warm and dry.     Findings: No erythema.  Neurological:     Mental Status: She is alert and oriented to person, place, and time.  Psychiatric:        Mood and Affect: Mood normal.        Behavior: Behavior normal.        Thought Content: Thought content normal.        Judgment: Judgment normal.     Lab Results  Component Value Date   WBC 6.7 10/16/2017   HGB 10.7 (L) 10/16/2017   HCT 34.6 10/16/2017   PLT 408 (H) 10/16/2017   GLUCOSE 90 07/12/2017   ALT 21 07/12/2017   AST 23 07/12/2017   NA 137 07/12/2017   K 4.3 07/12/2017   CL 104 07/12/2017   CREATININE 0.79 07/12/2017   BUN 11 07/12/2017   CO2 25 07/12/2017   INR 1.0 10/16/2017   HGBA1C 5.8 (H) 06/16/2017    Assessment & Plan:   Cheyenne Lozano was seen today for establish care.  Diagnoses and all orders for this visit:  Upper back pain, chronic -     Ambulatory referral to Physical Therapy -     cyclobenzaprine (FLEXERIL) 5 MG tablet; Take 1 tablet (5 mg total) by mouth at bedtime. -     ibuprofen (ADVIL) 600 MG tablet; Take 1 tablet (600 mg total) by  mouth every 8 (eight) hours as needed (with food).  Hyperglycemia -     Hemoglobin A1c -     TSH -     Comprehensive metabolic panel  Hallucination, visual -     Ambulatory referral to Psychiatry  Myalgia -     CBC -     Vitamin D 1,25 dihydroxy -     cyclobenzaprine (FLEXERIL) 5 MG tablet; Take 1 tablet (5 mg total) by mouth at bedtime. -     ibuprofen (ADVIL) 600 MG tablet; Take 1 tablet (600 mg total) by mouth every 8 (eight) hours as needed (with food).  Need for immunization against influenza -     Flu Vaccine QUAD 36+ mos  IM   I have discontinued Cheyenne Lozano's norgestimate-ethinyl estradiol, fluticasone, Cetirizine HCl, brompheniramine-pseudoephedrine-DM, and ondansetron. I have also changed her ibuprofen. Additionally, I am having her start on cyclobenzaprine.  Meds ordered this encounter  Medications  . cyclobenzaprine (FLEXERIL) 5 MG tablet    Sig: Take 1 tablet (5 mg total) by mouth at bedtime.    Dispense:  30 tablet    Refill:  0    Order Specific Question:   Supervising Provider    Answer:   MATTHEWS, CODY [[id number]]  . ibuprofen (ADVIL) 600 MG tablet    Sig: Take 1 tablet (600 mg total) by mouth every 8 (eight) hours as needed (with food).    Dispense:  30 tablet    Refill:  2    Order Specific Question:   Supervising Provider    Answer:   MATTHEWS, CODY [[id number]]    Problem List Items Addressed This Visit    None    Visit Diagnoses    Upper back pain, chronic    -  Primary   Relevant Medications   cyclobenzaprine (FLEXERIL) 5 MG tablet   ibuprofen (ADVIL) 600 MG tablet   Other Relevant Orders   Ambulatory referral to Physical Therapy   Hyperglycemia       Relevant Orders   Hemoglobin A1c   TSH   Comprehensive metabolic panel   Hallucination, visual       Relevant Orders   Ambulatory referral to Psychiatry   Myalgia       Relevant Medications   cyclobenzaprine (FLEXERIL) 5 MG tablet   ibuprofen (ADVIL) 600 MG tablet   Other Relevant Orders    CBC   Vitamin D 1,25 dihydroxy   Need for immunization against influenza       Relevant Orders   Flu Vaccine QUAD 36+ mos IM (Completed)      Follow-up: Return in about 6 weeks (around 05/17/2019) for upper back pain.  Alysia Pennaharlotte Patirica Longshore, NP

## 2019-04-05 NOTE — Patient Instructions (Addendum)
You will be contacted to schedule appt with psychiatry and PT. Start home exercise, once a day Use muscle relaxant only at bedtime. Continue ibuprofen as needed.  Normal cbc, cmp and TSH hgb A1c indicates prediabetes. Maintain heart healthy diet and regular exercise. Will need to repeat hgb A1c in 74months  We will request last PAP from GYN: [facility].   Back Exercises The following exercises strengthen the muscles that help to support the trunk and back. They also help to keep the lower back flexible. Doing these exercises can help to prevent back pain or lessen existing pain.  If you have back pain or discomfort, try doing these exercises 2-3 times each day or as told by your health care provider.  As your pain improves, do them once each day, but increase the number of times that you repeat the steps for each exercise (do more repetitions).  To prevent the recurrence of back pain, continue to do these exercises once each day or as told by your health care provider. Do exercises exactly as told by your health care provider and adjust them as directed. It is normal to feel mild stretching, pulling, tightness, or discomfort as you do these exercises, but you should stop right away if you feel sudden pain or your pain gets worse. Exercises Single knee to chest Repeat these steps 3-5 times for each leg: 1. Lie on your back on a firm bed or the floor with your legs extended. 2. Bring one knee to your chest. Your other leg should stay extended and in contact with the floor. 3. Hold your knee in place by grabbing your knee or thigh with both hands and hold. 4. Pull on your knee until you feel a gentle stretch in your lower back or buttocks. 5. Hold the stretch for 10-30 seconds. 6. Slowly release and straighten your leg. Pelvic tilt Repeat these steps 5-10 times: 1. Lie on your back on a firm bed or the floor with your legs extended. 2. Bend your knees so they are pointing  toward the ceiling and your feet are flat on the floor. 3. Tighten your lower abdominal muscles to press your lower back against the floor. This motion will tilt your pelvis so your tailbone points up toward the ceiling instead of pointing to your feet or the floor. 4. With gentle tension and even breathing, hold this position for 5-10 seconds. Cat-cow Repeat these steps until your lower back becomes more flexible: 1. Get into a hands-and-knees position on a firm surface. Keep your hands under your shoulders, and keep your knees under your hips. You may place padding under your knees for comfort. 2. Let your head hang down toward your chest. Contract your abdominal muscles and point your tailbone toward the floor so your lower back becomes rounded like the back of a cat. 3. Hold this position for 5 seconds. 4. Slowly lift your head, let your abdominal muscles relax and point your tailbone up toward the ceiling so your back forms a sagging arch like the back of a cow. 5. Hold this position for 5 seconds.  Press-ups Repeat these steps 5-10 times: 1. Lie on your abdomen (face-down) on the floor. 2. Place your palms near your head, about shoulder-width apart. 3. Keeping your back as relaxed as possible and keeping your hips on the floor, slowly straighten your arms to raise the top half of your body and lift your shoulders. Do not use your back muscles to raise your  upper torso. You may adjust the placement of your hands to make yourself more comfortable. 4. Hold this position for 5 seconds while you keep your back relaxed. 5. Slowly return to lying flat on the floor.  Bridges Repeat these steps 10 times: 1. Lie on your back on a firm surface. 2. Bend your knees so they are pointing toward the ceiling and your feet are flat on the floor. Your arms should be flat at your sides, next to your body. 3. Tighten your buttocks muscles and lift your buttocks off the floor until your waist is at almost the  same height as your knees. You should feel the muscles working in your buttocks and the back of your thighs. If you do not feel these muscles, slide your feet 1-2 inches farther away from your buttocks. 4. Hold this position for 3-5 seconds. 5. Slowly lower your hips to the starting position, and allow your buttocks muscles to relax completely. If this exercise is too easy, try doing it with your arms crossed over your chest. Abdominal crunches Repeat these steps 5-10 times: 1. Lie on your back on a firm bed or the floor with your legs extended. 2. Bend your knees so they are pointing toward the ceiling and your feet are flat on the floor. 3. Cross your arms over your chest. 4. Tip your chin slightly toward your chest without bending your neck. 5. Tighten your abdominal muscles and slowly raise your trunk (torso) high enough to lift your shoulder blades a tiny bit off the floor. Avoid raising your torso higher than that because it can put too much stress on your low back and does not help to strengthen your abdominal muscles. 6. Slowly return to your starting position. Back lifts Repeat these steps 5-10 times: 1. Lie on your abdomen (face-down) with your arms at your sides, and rest your forehead on the floor. 2. Tighten the muscles in your legs and your buttocks. 3. Slowly lift your chest off the floor while you keep your hips pressed to the floor. Keep the back of your head in line with the curve in your back. Your eyes should be looking at the floor. 4. Hold this position for 3-5 seconds. 5. Slowly return to your starting position. Contact a health care provider if:  Your back pain or discomfort gets much worse when you do an exercise.  Your worsening back pain or discomfort does not lessen within 2 hours after you exercise. If you have any of these problems, stop doing these exercises right away. Do not do them again unless your health care provider says that you can. Get help right away  if:  You develop sudden, severe back pain. If this happens, stop doing the exercises right away. Do not do them again unless your health care provider says that you can. This information is not intended to replace advice given to you by your health care provider. Make sure you discuss any questions you have with your health care provider. Document Released: 07/14/2004 Document Revised: 10/11/2018 Document Reviewed: 03/08/2018 Elsevier Patient Education  2020 [employer].

## 2019-04-07 DIAGNOSIS — R441 Visual hallucinations: Secondary | ICD-10-CM | POA: Insufficient documentation

## 2019-04-07 DIAGNOSIS — M791 Myalgia, unspecified site: Secondary | ICD-10-CM | POA: Insufficient documentation

## 2019-04-07 DIAGNOSIS — R739 Hyperglycemia, unspecified: Secondary | ICD-10-CM | POA: Insufficient documentation

## 2019-04-07 DIAGNOSIS — G8929 Other chronic pain: Secondary | ICD-10-CM | POA: Insufficient documentation

## 2019-04-10 LAB — VITAMIN D 1,25 DIHYDROXY
Vitamin D 1, 25 (OH)2 Total: 61 pg/mL (ref 18–72)
Vitamin D2 1, 25 (OH)2: <8 pg/mL
Vitamin D3 1, 25 (OH)2: 61 pg/mL

## 2019-04-25 ENCOUNTER — Telehealth: Payer: Self-pay | Admitting: Nurse Practitioner

## 2019-04-25 NOTE — Telephone Encounter (Signed)

## 2019-04-26 ENCOUNTER — Other Ambulatory Visit: Payer: Self-pay

## 2019-04-26 ENCOUNTER — Encounter: Payer: Self-pay | Admitting: Nurse Practitioner

## 2019-04-26 ENCOUNTER — Ambulatory Visit (INDEPENDENT_AMBULATORY_CARE_PROVIDER_SITE_OTHER): Payer: 59 | Admitting: Nurse Practitioner

## 2019-04-26 VITALS — BP 100/74 | HR 75 | Temp 97.2°F | Ht 59.0 in | Wt 172.4 lb

## 2019-04-26 DIAGNOSIS — Z136 Encounter for screening for cardiovascular disorders: Secondary | ICD-10-CM | POA: Diagnosis not present

## 2019-04-26 DIAGNOSIS — Z13220 Encounter for screening for lipoid disorders: Secondary | ICD-10-CM | POA: Diagnosis not present

## 2019-04-26 DIAGNOSIS — G4700 Insomnia, unspecified: Secondary | ICD-10-CM | POA: Diagnosis not present

## 2019-04-26 DIAGNOSIS — Z0000 Encounter for general adult medical examination without abnormal findings: Secondary | ICD-10-CM

## 2019-04-26 NOTE — Progress Notes (Signed)
Subjective:    Patient ID: Cheyenne Lozano, female    DOB: Oct 19, 1990, 28 y.o.   MRN: [id number]  Patient presents today for complete physical   Insomnia Primary symptoms: sleep disturbance, difficulty falling asleep, no somnolence, no frequent awakening, premature morning awakening, malaise/fatigue, no napping.  The current episode started 1 to 4 weeks ago. The onset quality is gradual. The problem occurs nightly. The problem is unchanged. How many drinks containing alcohol do you have on a typical day when you are drinking: none.  Nothing relieves the symptoms. Past treatments include meditation. The treatment provided no relief. Typical bedtime:  8-10 P.M..  How long after going to bed to you fall asleep: over an hour.   PMH includes: no hypertension, no depression, no family stress or anxiety, no restless leg syndrome, no work related stressors, no chronic pain, no apnea. Prior diagnostic workup includes:  Blood work.   Sexual History (orientation,birth control, marital status, STD):sexually active with condoms  Depression/Suicide:up coming appt with psychiatry 05/09/2019 Depression screen [facility] 2/9 04/26/2019 04/05/2019 10/16/2017 07/25/2017 06/23/2017 06/16/2017 06/09/2017  Decreased Interest 0 0 0 0 1 0 1  Down, Depressed, Hopeless 0 0 0 0 0 0 0  PHQ - 2 Score 0 0 0 0 1 0 1  Altered sleeping - - 0 0 1 1 1   Tired, decreased energy - - 0 0 1 1 1   Change in appetite - - 0 0 0 0 0  Feeling bad or failure about yourself  - - 0 0 0 0 0  Trouble concentrating - - 0 0 0 0 0  Moving slowly or fidgety/restless - - 0 0 0 0 0  Suicidal thoughts - - 0 0 0 0 0  PHQ-9 Score - - 0 0 3 2 3   Some recent data might be hidden   Vision:  Hearing Screening   125Hz  250Hz  500Hz  1000Hz  2000Hz  3000Hz  4000Hz  6000Hz  8000Hz   Right ear:   25 20 20  20     Left ear:   20 20 20  20       Visual Acuity Screening   Right eye Left eye Both eyes  Without correction:     With correction: 20/20 20/25 2020    Dental:up to date  Immunizations: (TDAP, Hep C screen, Pneumovax, Influenza, zoster)  Health Maintenance  Topic Date Due  . Complete foot exam   04/20/2021*  . Eye exam for diabetics  04/20/2021*  . Urine Protein Check  04/20/2021*  . Pneumococcal vaccine  04/20/2021*  . Hemoglobin A1C  10/04/2019  . PAP-Cervical Cytology Screening  01/03/2020  . Pap Smear  01/03/2020  . Tetanus Vaccine  04/14/2027  . Flu Shot  Completed  . HIV Screening  Completed  *Topic was postponed. The date shown is not the original due date.   Diet:regular  Weight:  Wt Readings from Last 3 Encounters:  04/26/19 172 lb 6.4 oz (78.2 kg)  10/16/17 151 lb 8 oz (68.7 kg)  07/25/17 136 lb (61.7 kg)    Exercise:none  Fall Risk: Fall Risk  04/26/2019 04/05/2019 04/13/2017 03/16/2017 02/14/2017  Falls in the past year? 0 0 No No No   Advanced Directive: Advanced Directives 07/12/2017  Does Patient Have a Medical Advance Directive? No  Would patient like information on creating a medical advance directive? No - Patient declined     Medications and allergies reviewed with patient and updated if appropriate.  Patient Active Problem List   Diagnosis Date Noted  .  Insomnia 04/26/2019  . Hyperglycemia 04/07/2019  . Upper back pain, chronic 04/07/2019  . Hallucination, visual 04/07/2019  . Myalgia 04/07/2019  . Polycystic ovary syndrome 09/12/2018  . Postpartum care and examination 07/25/2017    Current Outpatient Medications on File Prior to Visit  Medication Sig Dispense Refill  . cyclobenzaprine (FLEXERIL) 5 MG tablet Take 1 tablet (5 mg total) by mouth at bedtime. 30 tablet 0  . ibuprofen (ADVIL) 600 MG tablet Take 1 tablet (600 mg total) by mouth every 8 (eight) hours as needed (with food). 30 tablet 2   No current facility-administered medications on file prior to visit.     Past Medical History:  Diagnosis Date  . Breast hypertrophy in female 02/26/2015  . Hypertension    Pre-ecclampsia  . Type  II diabetes mellitus (HCC)    "actually I'm prediabetic; dr. put me on RX to get A1C #'s down" (02/26/2015)    Past Surgical History:  Procedure Laterality Date  . BREAST REDUCTION SURGERY Bilateral 02/26/2015   Procedure: BILATERAL BREAST REDUCTION  ;  Surgeon: Etter Sjogrenavid Bowers, MD;  [facility];  Service: Plastics;  Laterality: Bilateral;  . CESAREAN SECTION N/A 06/28/2017   Procedure: CESAREAN SECTION;  Surgeon: Kathrynn RunningWouk, Noah Bedford, MD;  [facility];  Service: Obstetrics;  Laterality: N/A;  . REDUCTION MAMMAPLASTY Bilateral 02/26/2015  . WISDOM TOOTH EXTRACTION  ~ 2010   "bottom 2"    Social History   Socioeconomic History  . Marital status: Single    Spouse name: Not on file  . Number of children: 1  . Years of education: Not on file  . Highest education level: Not on file  Occupational History  . Not on file  Social Needs  . Financial resource strain: Not on file  . Food insecurity    Worry: Not on file    Inability: Not on file  . Transportation needs    Medical: Not on file    Non-medical: Not on file  Tobacco Use  . Smoking status: Never Smoker  . Smokeless tobacco: Never Used  Substance and Sexual Activity  . Alcohol use: Yes    Comment: social  . Drug use: No  . Sexual activity: Yes    Birth control/protection: Condom  Lifestyle  . Physical activity    Days per week: Not on file    Minutes per session: Not on file  . Stress: Not on file  Relationships  . Social Musicianconnections    Talks on phone: Not on file    Gets together: Not on file    Attends religious service: Not on file    Active member of club or organization: Not on file    Attends meetings of clubs or organizations: Not on file    Relationship status: Not on file  Other Topics Concern  . Not on file  Social History Narrative  . Not on file    Family History  Problem Relation Age of Onset  . Diabetes Father   . Hypertension Father   . Stroke Father   . Schizophrenia Father   .  Diabetes Maternal Grandmother   . Hypertension Maternal Grandmother   . Diabetes Maternal Grandfather   . Hypertension Maternal Grandfather   . Heart attack Maternal Grandfather   . Gout Maternal Grandfather   . Hypertension Paternal Grandmother   . Schizophrenia Other   . Schizophrenia Cousin         Review of Systems  Constitutional: Positive for malaise/fatigue.  Negative for fever and weight loss.  HENT: Negative for congestion and sore throat.   Eyes:       Negative for visual changes  Respiratory: Negative for apnea, cough and shortness of breath.   Cardiovascular: Negative for chest pain, palpitations and leg swelling.  Gastrointestinal: Negative for blood in stool, constipation, diarrhea and heartburn.  Genitourinary: Negative for dysuria, frequency and urgency.  Musculoskeletal: Negative for falls, joint pain and myalgias.  Skin: Negative for rash.  Neurological: Negative for dizziness, sensory change and headaches.  Endo/Heme/Allergies: Does not bruise/bleed easily.  Psychiatric/Behavioral: Positive for sleep disturbance. Negative for depression, substance abuse and suicidal ideas. The patient has insomnia. The patient is not nervous/anxious.     Objective:   Vitals:   04/26/19 1332  BP: 100/74  Pulse: 75  Temp: (!) 97.2 F (36.2 C)  SpO2: 97%    Body mass index is 34.82 kg/m.   Physical Examination:  Physical Exam Vitals signs reviewed.  Constitutional:      General: She is not in acute distress.    Appearance: She is well-developed. She is obese.  HENT:     Right Ear: Tympanic membrane, ear canal and external ear normal.     Left Ear: Tympanic membrane, ear canal and external ear normal.  Eyes:     Conjunctiva/sclera: Conjunctivae normal.     Pupils: Pupils are equal, round, and reactive to light.  Cardiovascular:     Rate and Rhythm: Normal rate and regular rhythm.     Heart sounds: Murmur present.  Pulmonary:     Effort: Pulmonary effort is  normal. No respiratory distress.     Breath sounds: Normal breath sounds.  Chest:     Chest wall: No tenderness.  Abdominal:     General: Bowel sounds are normal.     Palpations: Abdomen is soft.  Genitourinary:    Comments: Deferred breast and pelvic exam to GYN per patient Musculoskeletal: Normal range of motion.     Right lower leg: No edema.     Left lower leg: No edema.  Skin:    General: Skin is warm and dry.  Neurological:     Mental Status: She is alert and oriented to person, place, and time.     Deep Tendon Reflexes: Reflexes are normal and symmetric.  Psychiatric:        Mood and Affect: Mood normal.        Behavior: Behavior normal.        Thought Content: Thought content normal.     ASSESSMENT and PLAN:  [place] was seen today for annual exam.  Diagnoses and all orders for this visit:  Preventative health care -     HTN_4 Lipid panel  Insomnia, unspecified type  Encounter for lipid screening for cardiovascular disease -     HTN_4 Lipid panel   No problem-specific Assessment & Plan notes found for this encounter.     Problem List Items Addressed This Visit      Other   Insomnia    Other Visit Diagnoses    Preventative health care    -  Primary   Relevant Orders   HTN_4 Lipid panel   Encounter for lipid screening for cardiovascular disease       Relevant Orders   HTN_4 Lipid panel       Follow up: Return in about 6 months (around 10/24/2019) for hyperglycemia (fasting).  Alysia Penna, NP

## 2019-04-26 NOTE — Patient Instructions (Signed)
Try benadryl 25-50mg  at bedtime or melatonin 3-5mg  at bedtime as needed. Start regular exercise (walking 30-58mins a day). Maintain heart healthy diet and adequate oral hydration (60oz of water per day). Avoid caffeine and alcohol.  Heart murmur present. Since you are asymptomatic and murmur is soft, we will monitor at this time.  Call office if you develop chest pain, dizziness with squatting, palpitations, edema or shortness of breathe.  Go to lab for blood draw.  Maintain appt with psychiatry.  Health Maintenance, Female Adopting a healthy lifestyle and getting preventive care are important in promoting health and wellness. Ask your health care provider about:  The right schedule for you to have regular tests and exams.  Things you can do on your own to prevent diseases and keep yourself healthy. What should I know about diet, weight, and exercise? Eat a healthy diet   Eat a diet that includes plenty of vegetables, fruits, low-fat dairy products, and lean protein.  Do not eat a lot of foods that are high in solid fats, added sugars, or sodium. Maintain a healthy weight Body mass index (BMI) is used to identify weight problems. It estimates body fat based on height and weight. Your health care provider can help determine your BMI and help you achieve or maintain a healthy weight. Get regular exercise Get regular exercise. This is one of the most important things you can do for your health. Most adults should:  Exercise for at least 150 minutes each week. The exercise should increase your heart rate and make you sweat (moderate-intensity exercise).  Do strengthening exercises at least twice a week. This is in addition to the moderate-intensity exercise.  Spend less time sitting. Even light physical activity can be beneficial. Watch cholesterol and blood lipids Have your blood tested for lipids and cholesterol at 26 years of age, then have this test every 5 years. Have your  cholesterol levels checked more often if:  Your lipid or cholesterol levels are high.  You are older than 26 years of age.  You are at high risk for heart disease. What should I know about cancer screening? Depending on your health history and family history, you may need to have cancer screening at various ages. This may include screening for:  Breast cancer.  Cervical cancer.  Colorectal cancer.  Skin cancer.  Lung cancer. What should I know about heart disease, diabetes, and high blood pressure? Blood pressure and heart disease  High blood pressure causes heart disease and increases the risk of stroke. This is more likely to develop in people who have high blood pressure readings, are of African descent, or are overweight.  Have your blood pressure checked: ? Every 3-5 years if you are 74-43 years of age. ? Every year if you are 9 years old or older. Diabetes Have regular diabetes screenings. This checks your fasting blood sugar level. Have the screening done:  Once every three years after age 20 if you are at a normal weight and have a low risk for diabetes.  More often and at a younger age if you are overweight or have a high risk for diabetes. What should I know about preventing infection? Hepatitis B If you have a higher risk for hepatitis B, you should be screened for this virus. Talk with your health care provider to find out if you are at risk for hepatitis B infection. Hepatitis C Testing is recommended for:  Everyone born from [birth year] through 1963.  Anyone with known risk  factors for hepatitis C. Sexually transmitted infections (STIs)  Get screened for STIs, including gonorrhea and chlamydia, if: ? You are sexually active and are younger than 26 years of age. ? You are older than 26 years of age and your health care provider tells you that you are at risk for this type of infection. ? Your sexual activity has changed since you were last screened, and you are  at increased risk for chlamydia or gonorrhea. Ask your health care provider if you are at risk.  Ask your health care provider about whether you are at high risk for HIV. Your health care provider may recommend a prescription medicine to help prevent HIV infection. If you choose to take medicine to prevent HIV, you should first get tested for HIV. You should then be tested every 3 months for as long as you are taking the medicine. Pregnancy  If you are about to stop having your period (premenopausal) and you may become pregnant, seek counseling before you get pregnant.  Take 400 to 800 micrograms (mcg) of folic acid every day if you become pregnant.  Ask for birth control (contraception) if you want to prevent pregnancy. Osteoporosis and menopause Osteoporosis is a disease in which the bones lose minerals and strength with aging. This can result in bone fractures. If you are 37 years old or older, or if you are at risk for osteoporosis and fractures, ask your health care provider if you should:  Be screened for bone loss.  Take a calcium or vitamin D supplement to lower your risk of fractures.  Be given hormone replacement therapy (HRT) to treat symptoms of menopause. Follow these instructions at home: Lifestyle  Do not use any products that contain nicotine or tobacco, such as cigarettes, e-cigarettes, and chewing tobacco. If you need help quitting, ask your health care provider.  Do not use street drugs.  Do not share needles.  Ask your health care provider for help if you need support or information about quitting drugs. Alcohol use  Do not drink alcohol if: ? Your health care provider tells you not to drink. ? You are pregnant, may be pregnant, or are planning to become pregnant.  If you drink alcohol: ? Limit how much you use to 0-1 drink a day. ? Limit intake if you are breastfeeding.  Be aware of how much alcohol is in your drink. In the [place] one drink equals one 12 oz  bottle of beer (355 mL), one 5 oz glass of wine (148 mL), or one 1 oz glass of hard liquor (44 mL). General instructions  Schedule regular health, dental, and eye exams.  Stay current with your vaccines.  Tell your health care provider if: ? You often feel depressed. ? You have ever been abused or do not feel safe at home. Summary  Adopting a healthy lifestyle and getting preventive care are important in promoting health and wellness.  Follow your health care provider's instructions about healthy diet, exercising, and getting tested or screened for diseases.  Follow your health care provider's instructions on monitoring your cholesterol and blood pressure. This information is not intended to replace advice given to you by your health care provider. Make sure you discuss any questions you have with your health care provider. Document Released: 12/20/2010 Document Revised: 05/30/2018 Document Reviewed: 05/30/2018 Elsevier Patient Education  2020 [employer].

## 2019-04-27 ENCOUNTER — Encounter: Payer: Self-pay | Admitting: Nurse Practitioner

## 2019-04-27 LAB — LIPID PANEL
Cholesterol: 159 mg/dL (ref <200)
HDL: 48 mg/dL — ABNORMAL LOW (ref >=50)
LDL Cholesterol (Calc): 94 mg/dL (calc)
Non-HDL Cholesterol (Calc): 111 mg/dL (calc) (ref <130)
Total CHOL/HDL Ratio: 3.3 (calc) (ref <5.0)
Triglycerides: 82 mg/dL (ref <150)

## 2019-05-06 ENCOUNTER — Other Ambulatory Visit: Payer: Self-pay

## 2019-05-06 ENCOUNTER — Telehealth: Payer: Self-pay | Admitting: Nurse Practitioner

## 2019-05-06 DIAGNOSIS — Z0184 Encounter for antibody response examination: Secondary | ICD-10-CM

## 2019-05-06 NOTE — Telephone Encounter (Signed)
Ok to place TB skin test. When did she complete varicella vaccine?

## 2019-05-06 NOTE — Telephone Encounter (Signed)
Lab order, Left detail message to inform the pt Charlotte's message.

## 2019-05-06 NOTE — Telephone Encounter (Signed)
Pt stated exposed to chickenpox when she was young--didn't get the varicella vaccine---please advise   TB placed scheduled 05/07/2019 at 9 am.

## 2019-05-06 NOTE — Telephone Encounter (Signed)
Pt was wondering if she can get Varicella titer, and get TB placed for nursing school. Please advise    Copied from [place] [phone]. Topic: General - Other >> May 06, 2019  9:25 AM Cheyenne Lozano wrote: Reason for CRM:   Pt would like a TB test and a Varicella Injection.

## 2019-05-06 NOTE — Telephone Encounter (Signed)
I do not think that is enough to give her immunity. You can order labs but she might want to check with her insurance about cost.

## 2019-05-07 ENCOUNTER — Other Ambulatory Visit (INDEPENDENT_AMBULATORY_CARE_PROVIDER_SITE_OTHER): Payer: 59

## 2019-05-07 ENCOUNTER — Ambulatory Visit (INDEPENDENT_AMBULATORY_CARE_PROVIDER_SITE_OTHER): Payer: 59

## 2019-05-07 DIAGNOSIS — Z111 Encounter for screening for respiratory tuberculosis: Secondary | ICD-10-CM

## 2019-05-07 DIAGNOSIS — Z0184 Encounter for antibody response examination: Secondary | ICD-10-CM

## 2019-05-07 NOTE — Progress Notes (Signed)
Tuberculin skin test applied to Left ventral forearm. She will return in 48-72 hours for PPD read. thx dmf

## 2019-05-08 LAB — VARICELLA ZOSTER ANTIBODY, IGG: Varicella IgG: 3582 index

## 2019-05-09 ENCOUNTER — Encounter: Payer: Self-pay | Admitting: Nurse Practitioner

## 2019-05-09 ENCOUNTER — Ambulatory Visit (INDEPENDENT_AMBULATORY_CARE_PROVIDER_SITE_OTHER): Payer: 59 | Admitting: Psychiatry

## 2019-05-09 ENCOUNTER — Other Ambulatory Visit: Payer: Self-pay

## 2019-05-09 ENCOUNTER — Ambulatory Visit: Payer: 59

## 2019-05-09 DIAGNOSIS — F3181 Bipolar II disorder: Secondary | ICD-10-CM | POA: Diagnosis not present

## 2019-05-09 DIAGNOSIS — F411 Generalized anxiety disorder: Secondary | ICD-10-CM

## 2019-05-09 DIAGNOSIS — F319 Bipolar disorder, unspecified: Secondary | ICD-10-CM | POA: Insufficient documentation

## 2019-05-09 DIAGNOSIS — Z111 Encounter for screening for respiratory tuberculosis: Secondary | ICD-10-CM

## 2019-05-09 HISTORY — DX: Bipolar disorder, unspecified: F31.9

## 2019-05-09 HISTORY — DX: Generalized anxiety disorder: F41.1

## 2019-05-09 LAB — TB SKIN TEST
Induration: 0 mm
TB Skin Test: NEGATIVE

## 2019-05-09 MED ORDER — LAMOTRIGINE 25 MG PO TABS: ORAL_TABLET | ORAL | 0 refills | Status: DC

## 2019-05-09 MED ORDER — SERTRALINE HCL 50 MG PO TABS: ORAL_TABLET | ORAL | 0 refills | Status: DC

## 2019-05-09 MED ORDER — ZOLPIDEM TARTRATE 5 MG PO TABS: 5.0000 mg | ORAL_TABLET | Freq: Every evening | ORAL | 0 refills | Status: DC | PRN

## 2019-05-09 NOTE — Progress Notes (Signed)
PPD Reading Note  PPD read and results entered in [employer].  Result: 00 mm induration.  Interpretation: negative  If test not read within 48-72 hours of initial placement, patient advised to repeat in other arm 1-3 weeks after this test.  Allergic reaction: no
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
## 2019-06-06 ENCOUNTER — Ambulatory Visit (INDEPENDENT_AMBULATORY_CARE_PROVIDER_SITE_OTHER): Payer: Managed Care, Other (non HMO) | Admitting: Psychiatry

## 2019-06-06 ENCOUNTER — Other Ambulatory Visit: Payer: Self-pay

## 2019-06-06 DIAGNOSIS — F3181 Bipolar II disorder: Secondary | ICD-10-CM

## 2019-06-06 DIAGNOSIS — F411 Generalized anxiety disorder: Secondary | ICD-10-CM | POA: Diagnosis not present

## 2019-06-06 MED ORDER — ZOLPIDEM TARTRATE 5 MG PO TABS: 5.0000 mg | ORAL_TABLET | Freq: Every evening | ORAL | 1 refills | Status: DC | PRN

## 2019-06-06 MED ORDER — LAMOTRIGINE 100 MG PO TABS: 100.0000 mg | ORAL_TABLET | Freq: Every day | ORAL | 1 refills | Status: DC

## 2019-06-06 MED ORDER — SERTRALINE HCL 100 MG PO TABS: 100.0000 mg | ORAL_TABLET | Freq: Every day | ORAL | 1 refills | Status: DC
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
## 2019-06-20 ENCOUNTER — Ambulatory Visit: Payer: Managed Care, Other (non HMO) | Attending: Internal Medicine

## 2019-06-20 DIAGNOSIS — U071 COVID-19: Secondary | ICD-10-CM

## 2019-06-21 LAB — NOVEL CORONAVIRUS, NAA: SARS-CoV-2, NAA: NOT DETECTED

## 2019-07-29 ENCOUNTER — Other Ambulatory Visit: Payer: Self-pay

## 2019-07-30 ENCOUNTER — Encounter: Payer: Self-pay | Admitting: Nurse Practitioner

## 2019-07-30 ENCOUNTER — Ambulatory Visit (INDEPENDENT_AMBULATORY_CARE_PROVIDER_SITE_OTHER): Payer: 59 | Admitting: Nurse Practitioner

## 2019-07-30 ENCOUNTER — Telehealth: Payer: Self-pay | Admitting: Nurse Practitioner

## 2019-07-30 VITALS — BP 124/82 | HR 71 | Temp 98.1°F | Ht 59.0 in | Wt 166.0 lb

## 2019-07-30 DIAGNOSIS — M549 Dorsalgia, unspecified: Secondary | ICD-10-CM

## 2019-07-30 DIAGNOSIS — G8929 Other chronic pain: Secondary | ICD-10-CM

## 2019-07-30 MED ORDER — KETOROLAC TROMETHAMINE 30 MG/ML IJ SOLN
30.0000 mg | Freq: Once | INTRAMUSCULAR | Status: AC
  Administered 2019-07-30: 09:00:00 30 mg via INTRAMUSCULAR

## 2019-07-30 MED ORDER — CYCLOBENZAPRINE HCL 7.5 MG PO TABS: 7.5000 mg | ORAL_TABLET | Freq: Every evening | ORAL | 0 refills | Status: DC | PRN

## 2019-07-30 MED ORDER — IBUPROFEN 600 MG PO TABS: 600.0000 mg | ORAL_TABLET | Freq: Three times a day (TID) | ORAL | 1 refills | Status: DC | PRN

## 2019-07-30 NOTE — Telephone Encounter (Signed)
[facility] sent a message stating pt's insurance do not cover for Cyclobenzaprine 7.5 mg (do not give alternative). Please advise, do you want to change med?

## 2019-07-30 NOTE — Patient Instructions (Signed)
Acute Back Pain, Adult Acute back pain is sudden and usually short-lived. It is often caused by an injury to the muscles and tissues in the back. The injury may result from:  A muscle or ligament getting overstretched or torn (strained). Ligaments are tissues that connect bones to each other. Lifting something improperly can cause a back strain.  Wear and tear (degeneration) of the spinal disks. Spinal disks are circular tissue that provides cushioning between the bones of the spine (vertebrae).  Twisting motions, such as while playing sports or doing yard work.  A hit to the back.  Arthritis. You may have a physical exam, lab tests, and imaging tests to find the cause of your pain. Acute back pain usually goes away with rest and home care. Follow these instructions at home: Managing pain, stiffness, and swelling  Take over-the-counter and prescription medicines only as told by your health care provider.  Your health care provider may recommend applying ice during the first 24-48 hours after your pain starts. To do this: ? Put ice in a plastic bag. ? Place a towel between your skin and the bag. ? Leave the ice on for 20 minutes, 2-3 times a day.  If directed, apply heat to the affected area as often as told by your health care provider. Use the heat source that your health care provider recommends, such as a moist heat pack or a heating pad. ? Place a towel between your skin and the heat source. ? Leave the heat on for 20-30 minutes. ? Remove the heat if your skin turns bright red. This is especially important if you are unable to feel pain, heat, or cold. You have a greater risk of getting burned. Activity   Do not stay in bed. Staying in bed for more than 1-2 days can delay your recovery.  Sit up and stand up straight. Avoid leaning forward when you sit, or hunching over when you stand. ? If you work at a desk, sit close to it so you do not need to lean over. Keep your chin tucked  in. Keep your neck drawn back, and keep your elbows bent at a right angle. Your arms should look like the letter "L." ? Sit high and close to the steering wheel when you drive. Add lower back (lumbar) support to your car seat, if needed.  Take short walks on even surfaces as soon as you are able. Try to increase the length of time you walk each day.  Do not sit, drive, or stand in one place for more than 30 minutes at a time. Sitting or standing for long periods of time can put stress on your back.  Do not drive or use heavy machinery while taking prescription pain medicine.  Use proper lifting techniques. When you bend and lift, use positions that put less stress on your back: ? Bend your knees. ? Keep the load close to your body. ? Avoid twisting.  Exercise regularly as told by your health care provider. Exercising helps your back heal faster and helps prevent back injuries by keeping muscles strong and flexible.  Work with a physical therapist to make a safe exercise program, as recommended by your health care provider. Do any exercises as told by your physical therapist. Lifestyle  Maintain a healthy weight. Extra weight puts stress on your back and makes it difficult to have good posture.  Avoid activities or situations that make you feel anxious or stressed. Stress and anxiety increase muscle   tension and can make back pain worse. Learn ways to manage anxiety and stress, such as through exercise. General instructions  Sleep on a firm mattress in a comfortable position. Try lying on your side with your knees slightly bent. If you lie on your back, put a pillow under your knees.  Follow your treatment plan as told by your health care provider. This may include: ? Cognitive or behavioral therapy. ? Acupuncture or massage therapy. ? Meditation or yoga. Contact a health care provider if:  You have pain that is not relieved with rest or medicine.  You have increasing pain going down  into your legs or buttocks.  Your pain does not improve after 2 weeks.  You have pain at night.  You lose weight without trying.  You have a fever or chills. Get help right away if:  You develop new bowel or bladder control problems.  You have unusual weakness or numbness in your arms or legs.  You develop nausea or vomiting.  You develop abdominal pain.  You feel faint. Summary  Acute back pain is sudden and usually short-lived.  Use proper lifting techniques. When you bend and lift, use positions that put less stress on your back.  Take over-the-counter and prescription medicines and apply heat or ice as directed by your health care provider. This information is not intended to replace advice given to you by your health care provider. Make sure you discuss any questions you have with your health care provider. Document Revised: 09/25/2018 Document Reviewed: 01/18/2017 Elsevier Patient Education  2020 [employer]  

## 2019-07-30 NOTE — Progress Notes (Signed)
Subjective:  Patient ID: Cheyenne Lozano, female    DOB: May 07, 1991  Age: 29 y.o. MRN: [id number]  CC: Back Pain (Pt. voices that she has always had constant back pain, but over the weekend she pulled her back when working; she's experiencing upper and lower back pain at this time)  Back Pain This is a recurrent problem. The current episode started in the past 7 days. The problem is unchanged. The pain is present in the lumbar spine. The quality of the pain is described as cramping and aching. The pain does not radiate. The pain is severe. The pain is the same all the time. The symptoms are aggravated by lying down. Stiffness is present at night. Pertinent negatives include no abdominal pain, bladder incontinence, bowel incontinence, chest pain, dysuria, fever, headaches, leg pain, numbness, paresis, paresthesias, pelvic pain, perianal numbness, tingling, weakness or weight loss. Risk factors include lack of exercise, obesity, poor posture and sedentary lifestyle. She has tried NSAIDs for the symptoms. The treatment provided mild relief.  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  Outpatient Medications Prior to Visit  Medication Sig Dispense Refill  . lamoTRIgine (LAMICTAL) 100 MG tablet Take 1 tablet (100 mg total) by mouth at bedtime. 30 tablet 1  . sertraline (ZOLOFT) 100 MG tablet Take 1 tablet (100 mg total) by mouth daily. 30 tablet 1  . zolpidem (AMBIEN) 5 MG tablet Take 1 tablet (5 mg total) by mouth at bedtime as needed for sleep. 30 tablet 1  . cyclobenzaprine (FLEXERIL) 5 MG tablet Take 1 tablet (5 mg total) by mouth at bedtime. 30 tablet 0  . ibuprofen (ADVIL) 600 MG tablet Take 1 tablet (600 mg total) by mouth every 8 (eight) hours as needed (with food). 30 tablet 2   No facility-administered medications prior to visit.   ROS See HPI  Objective:  BP 124/82 (BP Location: Left Arm)   Pulse 71   Temp 98.1 F (36.7 C)  (Oral)   Ht 4\' 11"  (1.499 m)   Wt 166 lb (75.3 kg)   SpO2 99%   BMI 33.53 kg/m   BP Readings from Last 3 Encounters:  07/30/19 124/82  04/26/19 100/74  04/05/19 110/82    Wt Readings from Last 3 Encounters:  07/30/19 166 lb (75.3 kg)  04/26/19 172 lb 6.4 oz (78.2 kg)  10/16/17 151 lb 8 oz (68.7 kg)    Physical Exam Vitals reviewed.  Constitutional:      Appearance: She is obese.  Cardiovascular:     Rate and Rhythm: Normal rate.     Pulses: Normal pulses.  Pulmonary:     Effort: Pulmonary effort is normal.  Abdominal:     General: There is no distension.     Tenderness: There is no abdominal tenderness.  Musculoskeletal:        General: Tenderness present.     Cervical back: Normal range of motion and neck supple.     Right lower leg: No edema.     Left lower leg: No edema.  Skin:    General: Skin is warm and dry.     Findings: No erythema or rash.  Neurological:     Mental Status: She is alert and oriented to person, place, and time.     Motor: No weakness.     Gait: Gait normal.    Assessment & Plan:  This visit occurred during the SARS-CoV-2 public health emergency.  Safety  protocols were in place, including screening questions prior to the visit, additional usage of staff PPE, and extensive cleaning of exam room while observing appropriate contact time as indicated for disinfecting solutions.   Sumie was seen today for back pain.  Diagnoses and all orders for this visit:  Other acute back pain -     ketorolac (TORADOL) 30 MG/ML injection 30 mg -     cyclobenzaprine (FEXMID) 7.5 MG tablet; Take 1 tablet (7.5 mg total) by mouth at bedtime as needed for muscle spasms. -     ibuprofen (ADVIL) 600 MG tablet; Take 1 tablet (600 mg total) by mouth every 8 (eight) hours as needed (with food).   I have changed Freyja A. Massoud's cyclobenzaprine. I am also having her maintain her lamoTRIgine, sertraline, zolpidem, and ibuprofen. We administered  ketorolac.  Meds ordered this encounter  Medications  . ketorolac (TORADOL) 30 MG/ML injection 30 mg  . cyclobenzaprine (FEXMID) 7.5 MG tablet    Sig: Take 1 tablet (7.5 mg total) by mouth at bedtime as needed for muscle spasms.    Dispense:  30 tablet    Refill:  0    Order Specific Question:   Supervising Provider    Answer:   Lucille Passy [[id number]]  . ibuprofen (ADVIL) 600 MG tablet    Sig: Take 1 tablet (600 mg total) by mouth every 8 (eight) hours as needed (with food).    Dispense:  60 tablet    Refill:  1    Order Specific Question:   Supervising Provider    Answer:   Lucille Passy [[id number]]    Problem List Items Addressed This Visit    None    Visit Diagnoses    Other acute back pain    -  Primary   Relevant Medications   ketorolac (TORADOL) 30 MG/ML injection 30 mg (Completed) (Start on 07/30/2019  9:30 AM)   cyclobenzaprine (FEXMID) 7.5 MG tablet   ibuprofen (ADVIL) 600 MG tablet      Follow-up: Return if symptoms worsen or fail to improve.  Wilfred Lacy, NP

## 2019-07-31 MED ORDER — TIZANIDINE HCL 4 MG PO CAPS: 4.0000 mg | ORAL_CAPSULE | Freq: Three times a day (TID) | ORAL | 0 refills | Status: DC | PRN

## 2019-07-31 NOTE — Telephone Encounter (Signed)
changed to zanaflex

## 2019-08-05 DIAGNOSIS — Z3202 Encounter for pregnancy test, result negative: Secondary | ICD-10-CM | POA: Diagnosis not present

## 2019-08-05 DIAGNOSIS — E860 Dehydration: Secondary | ICD-10-CM | POA: Diagnosis not present

## 2019-08-05 DIAGNOSIS — H109 Unspecified conjunctivitis: Secondary | ICD-10-CM | POA: Diagnosis not present

## 2019-08-07 ENCOUNTER — Ambulatory Visit (INDEPENDENT_AMBULATORY_CARE_PROVIDER_SITE_OTHER): Payer: 59 | Admitting: Psychiatry

## 2019-08-07 ENCOUNTER — Other Ambulatory Visit: Payer: Self-pay

## 2019-08-07 DIAGNOSIS — F3181 Bipolar II disorder: Secondary | ICD-10-CM | POA: Diagnosis not present

## 2019-08-07 DIAGNOSIS — F411 Generalized anxiety disorder: Secondary | ICD-10-CM

## 2019-08-07 DIAGNOSIS — F410 Panic disorder [episodic paroxysmal anxiety] without agoraphobia: Secondary | ICD-10-CM

## 2019-08-07 MED ORDER — SERTRALINE HCL 100 MG PO TABS: 100.0000 mg | ORAL_TABLET | Freq: Every day | ORAL | 2 refills | Status: DC

## 2019-08-07 MED ORDER — CLONAZEPAM 0.5 MG PO TABS: 0.5000 mg | ORAL_TABLET | Freq: Two times a day (BID) | ORAL | 1 refills | Status: DC | PRN

## 2019-08-07 MED ORDER — ZOLPIDEM TARTRATE 5 MG PO TABS: 5.0000 mg | ORAL_TABLET | Freq: Every evening | ORAL | 2 refills | Status: DC | PRN

## 2019-08-07 MED ORDER — LAMOTRIGINE 150 MG PO TABS: 150.0000 mg | ORAL_TABLET | Freq: Every day | ORAL | 2 refills | Status: DC
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
## 2019-08-29 DIAGNOSIS — Z113 Encounter for screening for infections with a predominantly sexual mode of transmission: Secondary | ICD-10-CM | POA: Diagnosis not present

## 2019-08-29 DIAGNOSIS — N760 Acute vaginitis: Secondary | ICD-10-CM | POA: Diagnosis not present

## 2019-09-20 ENCOUNTER — Ambulatory Visit (INDEPENDENT_AMBULATORY_CARE_PROVIDER_SITE_OTHER): Payer: 59 | Admitting: Psychiatry

## 2019-09-20 ENCOUNTER — Other Ambulatory Visit: Payer: Self-pay

## 2019-09-20 DIAGNOSIS — F411 Generalized anxiety disorder: Secondary | ICD-10-CM

## 2019-09-20 DIAGNOSIS — F3181 Bipolar II disorder: Secondary | ICD-10-CM | POA: Diagnosis not present

## 2019-09-20 DIAGNOSIS — F410 Panic disorder [episodic paroxysmal anxiety] without agoraphobia: Secondary | ICD-10-CM | POA: Diagnosis not present

## 2019-09-20 MED ORDER — SERTRALINE HCL 100 MG PO TABS: 100.0000 mg | ORAL_TABLET | Freq: Every day | ORAL | 2 refills | Status: DC

## 2019-09-20 MED ORDER — ZOLPIDEM TARTRATE 5 MG PO TABS: 5.0000 mg | ORAL_TABLET | Freq: Every evening | ORAL | 2 refills | Status: DC | PRN

## 2019-09-20 MED ORDER — CLONAZEPAM 0.5 MG PO TABS: 0.5000 mg | ORAL_TABLET | Freq: Two times a day (BID) | ORAL | 2 refills | Status: DC | PRN

## 2019-09-20 MED ORDER — LAMOTRIGINE 150 MG PO TABS: 150.0000 mg | ORAL_TABLET | Freq: Every day | ORAL | 2 refills | Status: DC
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
## 2019-09-23 ENCOUNTER — Other Ambulatory Visit (HOSPITAL_COMMUNITY): Payer: Self-pay

## 2019-09-23 MED ORDER — ZOLPIDEM TARTRATE 5 MG PO TABS: 5.0000 mg | ORAL_TABLET | Freq: Every evening | ORAL | 2 refills | Status: DC | PRN

## 2019-09-24 ENCOUNTER — Telehealth (HOSPITAL_COMMUNITY): Payer: Self-pay

## 2019-09-24 MED ORDER — ZOLPIDEM TARTRATE 5 MG PO TABS: 5.0000 mg | ORAL_TABLET | Freq: Every evening | ORAL | 0 refills | Status: DC | PRN

## 2019-09-24 NOTE — Telephone Encounter (Signed)
Requesting prescription zolpidem 5mg  tabs. [facility] on .

## 2019-09-24 NOTE — Telephone Encounter (Signed)
Done

## 2019-10-13 IMAGING — US US FETAL BPP W/ NON-STRESS
1 series · 13 of 13 positions shown · non-contrast
Comparison: none

[Series 1: us fetal bpp w/nonstress · 13 acquisitions, 13 frames shown]
[im 1/13]
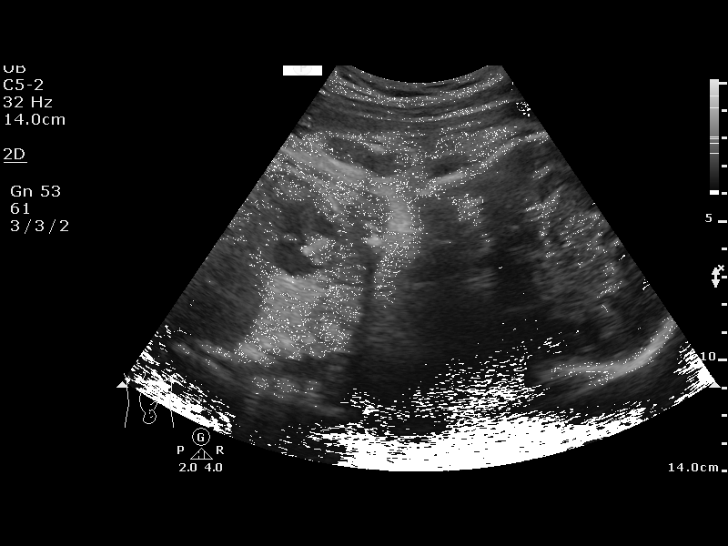
[im 2/13]
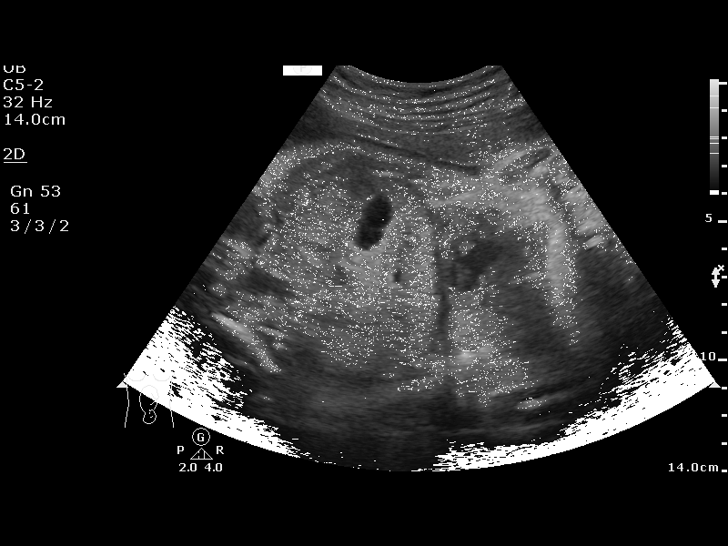
[im 3/13]
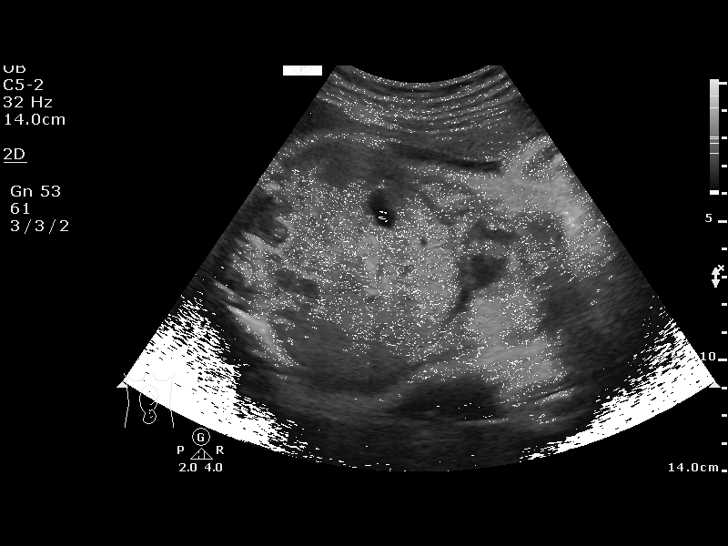
[im 4/13]
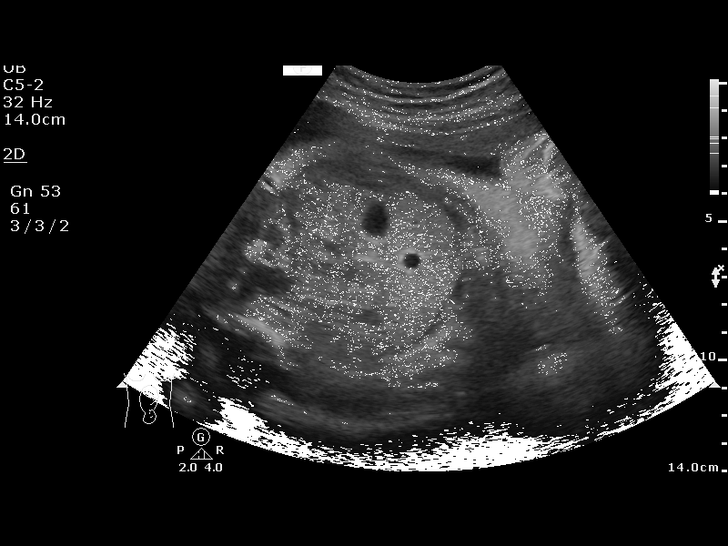
[im 5/13]
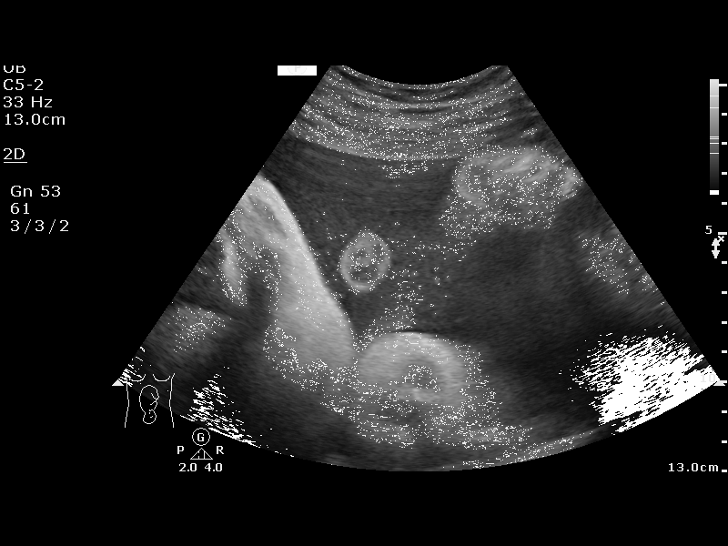
[im 6/13]
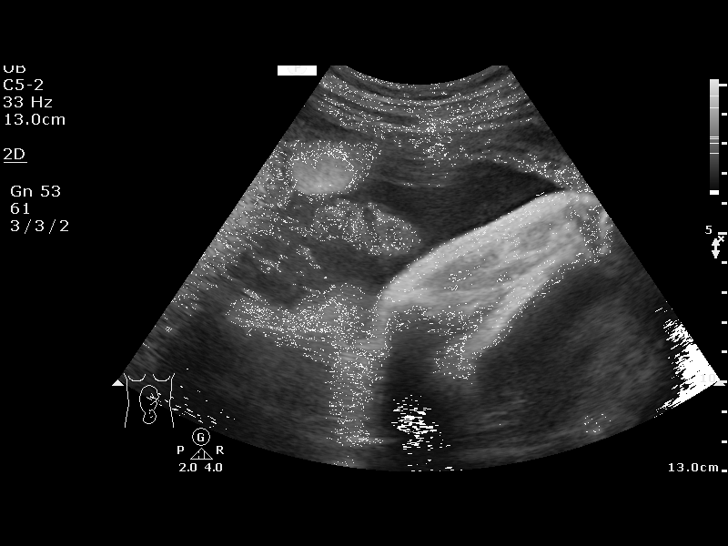
[im 7/13]
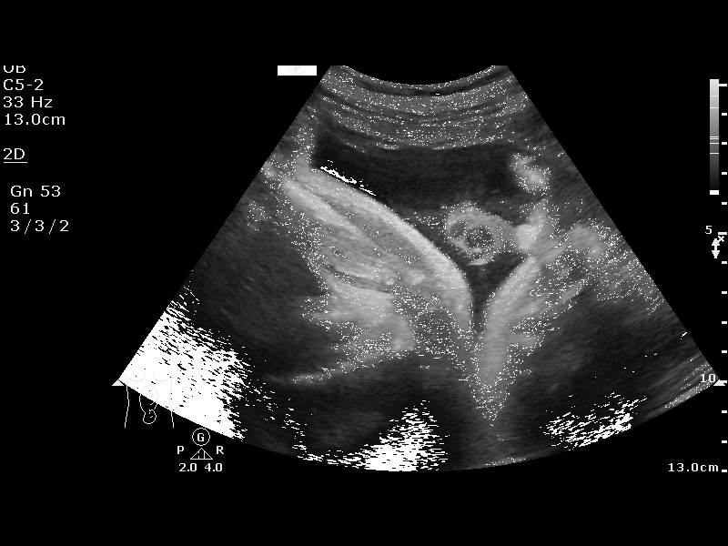
[im 8/13]
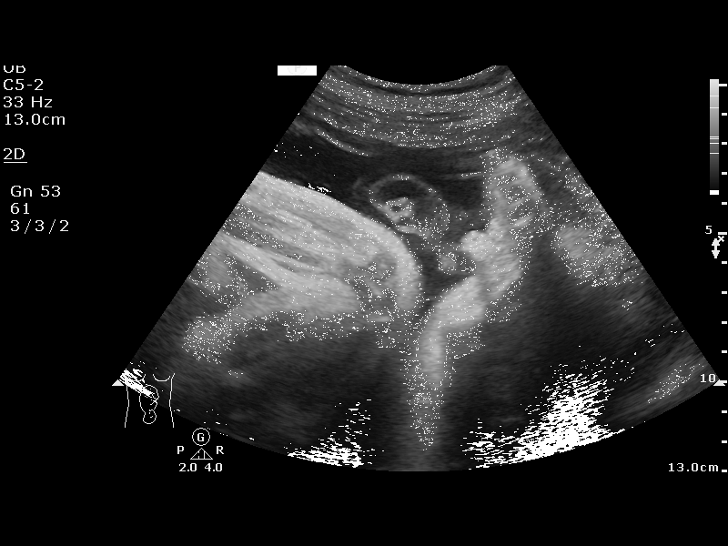
[im 9/13]
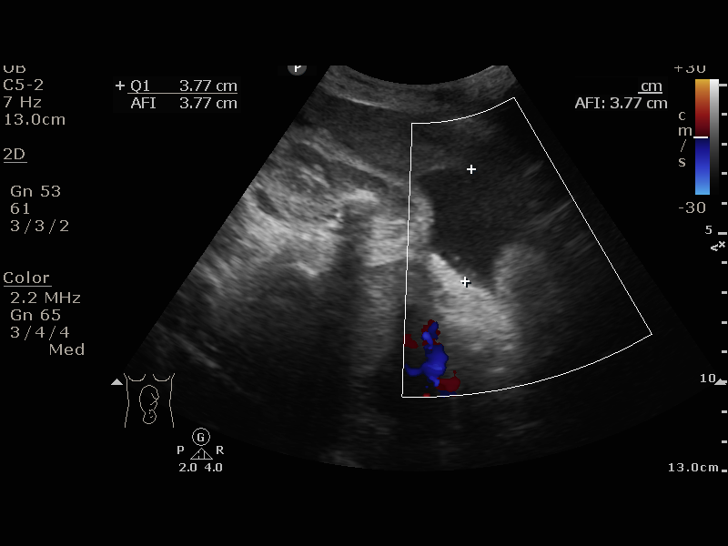
[im 10/13]
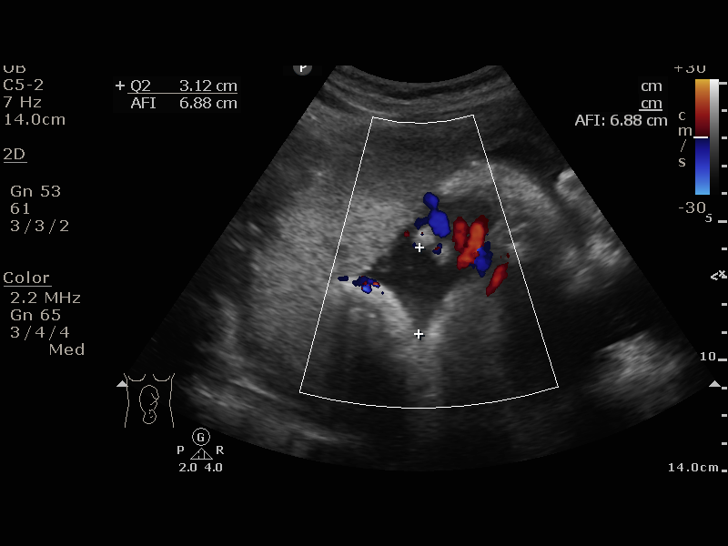
[im 11/13]
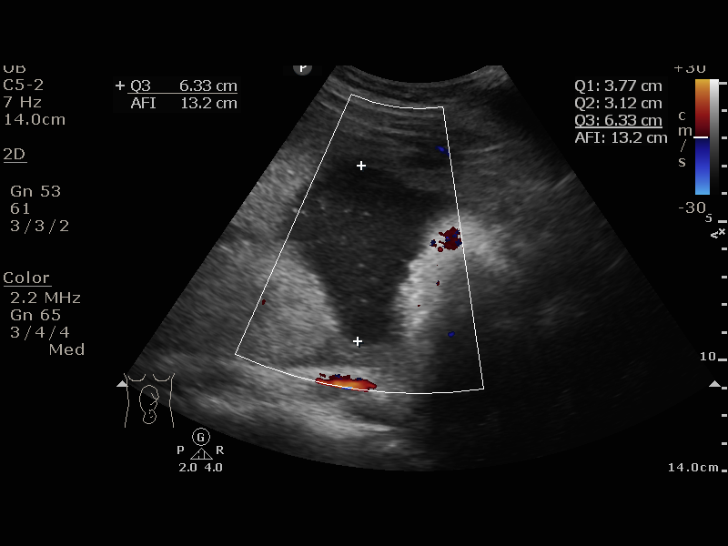
[im 12/13]
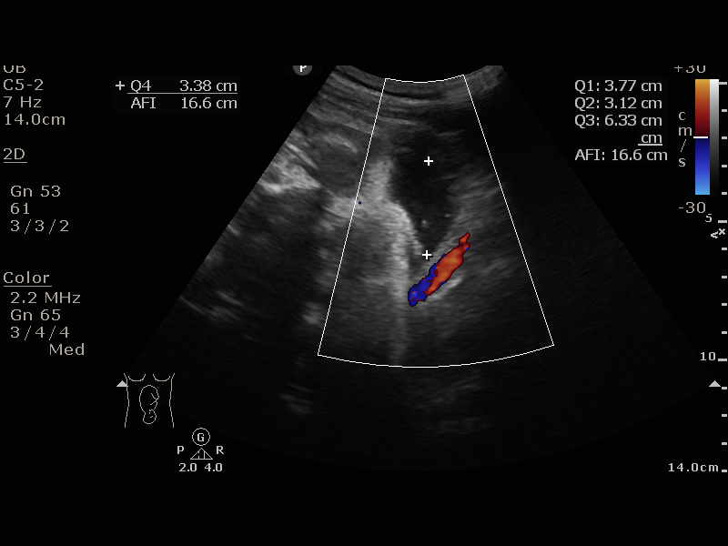
[im 13/13]
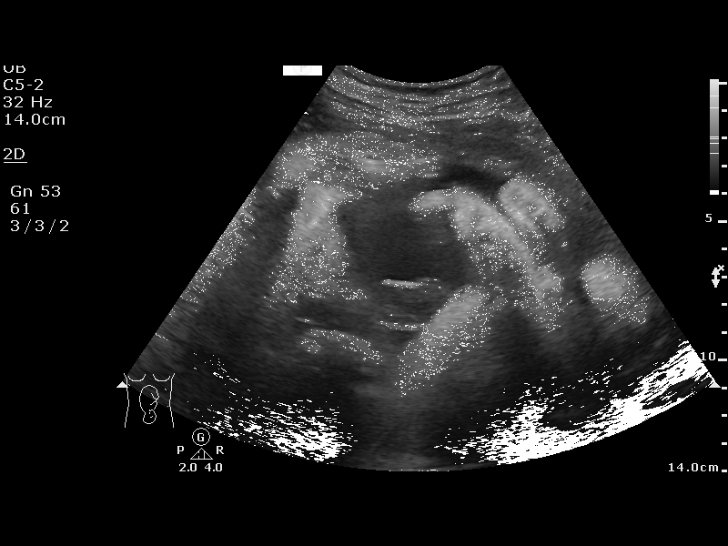

[13 of 13 positions shown; findings below may reference images not displayed]

OB/Gyn Clinic
Attending:        Djlali Sanchis         Location:         [facility] for
[REDACTED]

1  US FETAL BPP W/NONSTRESS                    76818.4

1  NDUKONG KOLLA           [id number]      [id number]     [id number]
Service(s) Provided

Indications

34 weeks gestation of pregnancy
Pre-existing diabetes, type 2, in pregnancy,
third trimester
OB History

Gravidity:    2         Term:   0        Prem:   0        SAB:   1
TOP:          0       Ectopic:  0        Living: 0
Fetal Evaluation

Num Of Fetuses:     1
Preg. Location:     Intrauterine
Cardiac Activity:   Observed
Presentation:       Cephalic

Amniotic Fluid
AFI FV:      Subjectively within normal limits

AFI Sum(cm)     %Tile       Largest Pocket(cm)
16.6            60
RUQ(cm)       RLQ(cm)       LUQ(cm)        LLQ(cm)
3.77
Biophysical Evaluation

Amniotic F.V:   Pocket => 2 cm two         F. Tone:        Observed
planes
F. Movement:    Observed                   N.S.T:          Reactive
F. Breathing:   Observed                   Score:          [DATE]
Gestational Age

LMP:           34w 1d        Date:  09/29/16                 EDD:   07/06/17
Best:          34w 1d     Det. By:  LMP  (09/29/16)          EDD:   07/06/17
Impression

IUP at  00w4d
Normal amniotic fluid volume
Recommendations

Continue antenatal testing.
Recommend delivery by 39 weeks if maternal and fetal status
remain reassuring.

## 2019-10-23 ENCOUNTER — Other Ambulatory Visit: Payer: Self-pay

## 2019-10-24 ENCOUNTER — Ambulatory Visit: Payer: 59 | Admitting: Nurse Practitioner

## 2019-10-24 DIAGNOSIS — Z0289 Encounter for other administrative examinations: Secondary | ICD-10-CM

## 2019-10-25 ENCOUNTER — Ambulatory Visit: Payer: 59 | Admitting: Nurse Practitioner

## 2019-10-27 ENCOUNTER — Encounter (HOSPITAL_COMMUNITY): Payer: Self-pay | Admitting: Emergency Medicine

## 2019-10-27 ENCOUNTER — Emergency Department (HOSPITAL_COMMUNITY)
Admission: EM | Admit: 2019-10-27 | Discharge: 2019-10-27 | Disposition: A | Discharge status: Home / Self Care | Payer: 59 | Attending: Emergency Medicine | Admitting: Emergency Medicine

## 2019-10-27 ENCOUNTER — Other Ambulatory Visit: Payer: Self-pay

## 2019-10-27 ENCOUNTER — Emergency Department (HOSPITAL_COMMUNITY): Payer: 59

## 2019-10-27 DIAGNOSIS — E119 Type 2 diabetes mellitus without complications: Secondary | ICD-10-CM | POA: Insufficient documentation

## 2019-10-27 DIAGNOSIS — R0789 Other chest pain: Secondary | ICD-10-CM | POA: Insufficient documentation

## 2019-10-27 DIAGNOSIS — R12 Heartburn: Secondary | ICD-10-CM | POA: Insufficient documentation

## 2019-10-27 DIAGNOSIS — Z79899 Other long term (current) drug therapy: Secondary | ICD-10-CM | POA: Diagnosis not present

## 2019-10-27 DIAGNOSIS — R079 Chest pain, unspecified: Secondary | ICD-10-CM | POA: Diagnosis not present

## 2019-10-27 LAB — CBC
HCT: 37.4 % (ref 36.0–46.0)
Hemoglobin: 11.3 g/dL — ABNORMAL LOW (ref 12.0–15.0)
MCH: 26.7 pg (ref 26.0–34.0)
MCHC: 30.2 g/dL (ref 30.0–36.0)
MCV: 88.2 fL (ref 80.0–100.0)
Platelets: 296 10*3/uL (ref 150–400)
RBC: 4.24 MIL/uL (ref 3.87–5.11)
RDW: 13.9 % (ref 11.5–15.5)
WBC: 5.8 10*3/uL (ref 4.0–10.5)
nRBC: 0 % (ref 0.0–0.2)

## 2019-10-27 LAB — BASIC METABOLIC PANEL
Anion gap: 7 (ref 5–15)
BUN: 12 mg/dL (ref 6–20)
CO2: 25 mmol/L (ref 22–32)
Calcium: 8.9 mg/dL (ref 8.9–10.3)
Chloride: 109 mmol/L (ref 98–111)
Creatinine, Ser: 0.8 mg/dL (ref 0.44–1.00)
GFR calc Af Amer: >60 mL/min (ref >60)
GFR calc non Af Amer: >60 mL/min (ref >60)
Glucose, Bld: 80 mg/dL (ref 70–99)
Potassium: 4.3 mmol/L (ref 3.5–5.1)
Sodium: 141 mmol/L (ref 135–145)

## 2019-10-27 LAB — D-DIMER, QUANTITATIVE: D-Dimer, Quant: 0.37 ug/mL-FEU (ref 0.00–0.50)

## 2019-10-27 LAB — TROPONIN I (HIGH SENSITIVITY): Troponin I (High Sensitivity): <2 ng/L (ref <18)

## 2019-10-27 LAB — I-STAT BETA HCG BLOOD, ED (MC, WL, AP ONLY): I-stat hCG, quantitative: <5 m[IU]/mL (ref <5)

## 2019-10-27 MED ORDER — SODIUM CHLORIDE 0.9% FLUSH: 3.0000 mL | Freq: Once | INTRAVENOUS | Status: DC

## 2019-10-27 NOTE — ED Triage Notes (Signed)
Pt states 2 nights ago she had heartburn.  Woke up with L shoulder pain yesterday.  Today pain has moved to center of chest and radiates to back.  Also reports mild nausea and intermittent "pins and needles" to left extremities.  States chest pain is only with movement at this time.

## 2019-10-27 NOTE — ED Provider Notes (Signed)
[facility] EMERGENCY DEPARTMENT Provider Note   CSN: [id number] Arrival date & time: 10/27/19  1141     History Chief Complaint  Patient presents with  . Chest Pain    Cheyenne Lozano is a 29 y.o. female with PMHx HTN, diabetes who present to the ED today with complaint of gradual onset, constant, worsening, diffuse chest pain x 2 days. Patient reports 2 nights ago she was laying on the couch when she felt heartburn. She states she had heartburn when she was pregnant and knows what it feels like. He states she drinks some water and went to bed. She states she woke up the next morning and her left shoulder felt very sore. She states this has persisted however has gotten somewhat better. She states that yesterday the pain from her shoulder radiated into her chest and has been present since then. Patient states the pain is worse with lying flat, deep breath, movement of any kind. She states that she sits up and sits very still the pain is somewhat controlled. She has not taken anything for pain. [place] any previous heart disease or chest pain in the past. Does note her paternal uncle had a heart attack in his 2s or 95s however she is not exactly sure when. She states that her grandfather also had a heart attack when he was 27 years old. She denies any history of DVT/PE. No recent prolonged travel or immobilization. No surgeries. No exogenous hormone use. No hemoptysis. No active malignancy. Patient denies fevers, chills, shortness of breath, nausea, vomiting, palpitations, diaphoresis, unilateral leg swelling, any other associated symptoms.   The history is provided by the patient and medical records.       Past Medical History:  Diagnosis Date  . Breast hypertrophy in female 02/26/2015  . Hypertension    Pre-ecclampsia  . Type II diabetes mellitus (HCC)    "actually I'm prediabetic; dr. put me on RX to get A1C #'s down" (02/26/2015)    Patient Active Problem List   Diagnosis Date Noted  . Panic disorder 08/07/2019  . GAD (generalized anxiety disorder) 05/09/2019  . Bipolar 2 disorder (HCC) 05/09/2019  . Insomnia 04/26/2019  . Hyperglycemia 04/07/2019  . Upper back pain, chronic 04/07/2019  . Hallucination, visual 04/07/2019  . Myalgia 04/07/2019  . Polycystic ovary syndrome 09/12/2018  . Postpartum care and examination 07/25/2017    Past Surgical History:  Procedure Laterality Date  . BREAST REDUCTION SURGERY Bilateral 02/26/2015   Procedure: BILATERAL BREAST REDUCTION  ;  Surgeon: Etter Sjogren, MD;  Location: [facility] OR;  Service: Plastics;  Laterality: Bilateral;  . CESAREAN SECTION N/A 06/28/2017   Procedure: CESAREAN SECTION;  Surgeon: Kathrynn Running, MD;  Location: [facility] BIRTHING SUITES;  Service: Obstetrics;  Laterality: N/A;  . REDUCTION MAMMAPLASTY Bilateral 02/26/2015  . WISDOM TOOTH EXTRACTION  ~ 2010   "bottom 2"     OB History    Gravida  2   Para  1   Term  1   Preterm      AB  1   Living  1     SAB  1   TAB      Ectopic      Multiple  0   Live Births  1           Family History  Problem Relation Age of Onset  . Diabetes Father   . Hypertension Father   . Stroke Father   . Schizophrenia Father   .  Diabetes Maternal Grandmother   . Hypertension Maternal Grandmother   . Diabetes Maternal Grandfather   . Hypertension Maternal Grandfather   . Heart attack Maternal Grandfather   . Gout Maternal Grandfather   . Hypertension Paternal Grandmother   . Schizophrenia Other   . Schizophrenia Cousin     Social History   Tobacco Use  . Smoking status: Never Smoker  . Smokeless tobacco: Never Used  Substance Use Topics  . Alcohol use: Yes    Comment: social  . Drug use: No    Home Medications Prior to Admission medications   Medication Sig Start Date End Date Taking? Authorizing Provider  clonazePAM (KLONOPIN) 0.5 MG tablet Take 1 tablet (0.5 mg total) by mouth 2 (two) times daily as needed for anxiety.  09/20/19 12/19/19  Pucilowski, Marchia Bond, MD  ibuprofen (ADVIL) 600 MG tablet Take 1 tablet (600 mg total) by mouth every 8 (eight) hours as needed (with food). 07/30/19   Nche, Charlene Brooke, NP  lamoTRIgine (LAMICTAL) 150 MG tablet Take 1 tablet (150 mg total) by mouth at bedtime. 09/20/19 12/19/19  Pucilowski, Marchia Bond, MD  sertraline (ZOLOFT) 100 MG tablet Take 1 tablet (100 mg total) by mouth daily. 09/20/19 12/19/19  Pucilowski, Marchia Bond, MD  tiZANidine (ZANAFLEX) 4 MG capsule Take 1 capsule (4 mg total) by mouth 3 (three) times daily as needed for muscle spasms. 07/31/19   Nche, Charlene Brooke, NP  zolpidem (AMBIEN) 5 MG tablet Take 1 tablet (5 mg total) by mouth at bedtime as needed for sleep. 09/24/19 12/23/19  Kathlee Nations, MD    Allergies    Patient has no known allergies.  Review of Systems   Review of Systems  Constitutional: Negative for chills and fever.  Respiratory: Negative for cough and shortness of breath.   Cardiovascular: Positive for chest pain.  Gastrointestinal: Negative for abdominal pain, nausea and vomiting.  All other systems reviewed and are negative.   Physical Exam Updated Vital Signs BP 108/74   Pulse 63   Temp 98.6 F (37 C) (Oral)   Resp (!) 21   Ht 4\' 11"  (1.499 m)   Wt 73.9 kg   LMP 10/27/2019   SpO2 100%   BMI 32.92 kg/m   Physical Exam Vitals and nursing note reviewed.  Constitutional:      Appearance: She is not ill-appearing or diaphoretic.  HENT:     Head: Normocephalic and atraumatic.  Eyes:     Conjunctiva/sclera: Conjunctivae normal.  Cardiovascular:     Rate and Rhythm: Normal rate and regular rhythm.     Pulses:          Radial pulses are 2+ on the right side and 2+ on the left side.       Dorsalis pedis pulses are 2+ on the right side and 2+ on the left side.  Pulmonary:     Effort: Pulmonary effort is normal.     Breath sounds: Normal breath sounds. No decreased breath sounds, wheezing, rhonchi or rales.  Chest:     Chest wall:  Tenderness present.  Abdominal:     Palpations: Abdomen is soft.     Tenderness: There is no abdominal tenderness. There is no guarding or rebound.  Musculoskeletal:     Cervical back: Neck supple.  Skin:    General: Skin is warm and dry.  Neurological:     Mental Status: She is alert.     ED Results / Procedures / Treatments   Labs (all  labs ordered are listed, but only abnormal results are displayed) Labs Reviewed  CBC - Abnormal; Notable for the following components:      Result Value   Hemoglobin 11.3 (*)    All other components within normal limits  BASIC METABOLIC PANEL  D-DIMER, QUANTITATIVE (NOT AT [facility])  I-STAT BETA HCG BLOOD, ED ([facility], [facility], [facility] ONLY)  TROPONIN I (HIGH SENSITIVITY)    EKG None  Radiology DG Chest 2 View  Result Date: 10/27/2019 CLINICAL DATA:  Chest pain EXAM: CHEST - 2 VIEW COMPARISON:  None. FINDINGS: The heart size and mediastinal contours are within normal limits. Both lungs are clear. The visualized skeletal structures are unremarkable. IMPRESSION: No active cardiopulmonary disease. Electronically Signed   By: Romona Curls M.D.   On: 10/27/2019 12:21    Procedures Procedures (including critical care time)  Medications Ordered in ED Medications  sodium chloride flush (NS) 0.9 % injection 3 mL (3 mLs Intravenous Not Given 10/27/19 1236)    ED Course  I have reviewed the triage vital signs and the nursing notes.  Pertinent labs & imaging results that were available during my care of the patient were reviewed by me and considered in my medical decision making (see chart for details).  Clinical Course as of Oct 26 1637  Wynelle Link Oct 27, 2019  1338 D-Dimer, Quant: 0.37 [MV]    Clinical Course User Index [MV] Tanda Rockers, PA-C   MDM Rules/Calculators/A&P                      29 year old female presents with seems musculoskeletal in nature.  A denies any heavy lifting recently.  Works in the OR at [employer].  Is having some mild chest pain  currently however she has no risk factors for ACS suddenly however does report family history.  Patient is non-smoker.  She is PERC negative however states that the pain is worse with deep inspiration and therefore will obtain D-dimer as well.  Work-up for chest pain including EKG, chest x-ray, troponins and reevaluate.  She appears to be in no acute distress on arrival.  She is noted to be afebrile, nontachycardic and nontachypneic.  Satting 98% on room air.  No complaint of shortness of breath.   EKG without acute ischemic changes. X-ray negative. BC without leukocytosis.  Hemoglobin stable compared to baseline at 11.3. BMP without any electrolyte abnormalities. Troponin less than 2.  Given patient's pain has been ongoing for 2 days I do not feel she needs a delta troponin at this time.  Beta hcg negative. Dimer negative at 0.37, low Wells risk score.  Do not feel patient CTA.   Labwork very reassuring at this time.  Patient does have some tenderness to her chest wall with palpation and therefore I am more suspicious of a musculoskeletal type pain.  Patient advised to take ibuprofen and Tylenol as needed for pain and to follow-up with her PCP.  She is strongly encouraged to return to the ED for any worsening symptoms.  She is in agreement with plan at this time stable for discharge home.   This note was prepared using Dragon voice recognition software and may include unintentional dictation errors due to the inherent limitations of voice recognition software.   Final Clinical Impression(s) / ED Diagnoses Final diagnoses:  Nonspecific chest pain    Rx / DC Orders ED Discharge Orders    None       Discharge Instructions     Your  labwork and imaging were reassuring today Your symptoms may be related to musculoskeletal pain. I would recommend Ibuprofen and Tylenol as needed for pain Please follow up with your PCP for further eval Return to the ED IMMEDIATELY for any worsening symptoms  including worsening chest pain, shortness of breath, ripping/tearing back pain, passing out, coughing up blood, nausea, vomiting, excessive sweating, unilateral leg swelling, or any other new or concerning symptoms       Tanda Rockers, PA-C 10/27/19 1640    Rolan Bucco, MD 10/31/19 1502

## 2019-10-27 NOTE — Discharge Instructions (Signed)
Your labwork and imaging were reassuring today Your symptoms may be related to musculoskeletal pain. I would recommend Ibuprofen and Tylenol as needed for pain Please follow up with your PCP for further eval Return to the ED IMMEDIATELY for any worsening symptoms including worsening chest pain, shortness of breath, ripping/tearing back pain, passing out, coughing up blood, nausea, vomiting, excessive sweating, unilateral leg swelling, or any other new or concerning symptoms

## 2020-01-01 ENCOUNTER — Telehealth (INDEPENDENT_AMBULATORY_CARE_PROVIDER_SITE_OTHER): Payer: 59 | Admitting: Psychiatry

## 2020-01-01 ENCOUNTER — Other Ambulatory Visit: Payer: Self-pay

## 2020-01-01 ENCOUNTER — Other Ambulatory Visit (HOSPITAL_COMMUNITY): Payer: Self-pay | Admitting: *Deleted

## 2020-01-01 DIAGNOSIS — F410 Panic disorder [episodic paroxysmal anxiety] without agoraphobia: Secondary | ICD-10-CM | POA: Diagnosis not present

## 2020-01-01 DIAGNOSIS — F411 Generalized anxiety disorder: Secondary | ICD-10-CM

## 2020-01-01 DIAGNOSIS — F3181 Bipolar II disorder: Secondary | ICD-10-CM

## 2020-01-01 MED ORDER — LAMOTRIGINE 150 MG PO TABS: 150.0000 mg | ORAL_TABLET | Freq: Every day | ORAL | 2 refills | Status: DC

## 2020-01-01 MED ORDER — ZOLPIDEM TARTRATE 5 MG PO TABS: 5.0000 mg | ORAL_TABLET | Freq: Every evening | ORAL | 2 refills | Status: DC | PRN

## 2020-01-01 MED ORDER — CLONAZEPAM 0.5 MG PO TABS: 0.5000 mg | ORAL_TABLET | Freq: Three times a day (TID) | ORAL | 2 refills | Status: DC | PRN

## 2020-01-01 MED ORDER — SERTRALINE HCL 100 MG PO TABS: 150.0000 mg | ORAL_TABLET | Freq: Every day | ORAL | 2 refills | Status: DC

## 2020-01-01 MED FILL — SERTRALINE HCL 100 MG TABS: 100 | 30 days supply | Qty: 45 | Fill #0

## 2020-01-01 MED FILL — SUBVENITE 150 MG TABS: 150 | 30 days supply | Qty: 30 | Fill #0
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
## 2020-03-04 ENCOUNTER — Other Ambulatory Visit: Payer: Self-pay

## 2020-03-04 ENCOUNTER — Telehealth (INDEPENDENT_AMBULATORY_CARE_PROVIDER_SITE_OTHER): Payer: 59 | Admitting: Psychiatry

## 2020-03-04 ENCOUNTER — Other Ambulatory Visit (HOSPITAL_COMMUNITY): Payer: Self-pay | Admitting: Psychiatry

## 2020-03-04 DIAGNOSIS — F411 Generalized anxiety disorder: Secondary | ICD-10-CM

## 2020-03-04 DIAGNOSIS — F410 Panic disorder [episodic paroxysmal anxiety] without agoraphobia: Secondary | ICD-10-CM | POA: Diagnosis not present

## 2020-03-04 DIAGNOSIS — F3181 Bipolar II disorder: Secondary | ICD-10-CM

## 2020-03-04 MED ORDER — SERTRALINE HCL 100 MG PO TABS: 200.0000 mg | ORAL_TABLET | Freq: Every day | ORAL | 5 refills | Status: DC

## 2020-03-04 MED ORDER — CLONAZEPAM 0.5 MG PO TABS: 0.5000 mg | ORAL_TABLET | Freq: Two times a day (BID) | ORAL | 5 refills | Status: DC | PRN

## 2020-03-04 MED ORDER — LAMOTRIGINE 150 MG PO TABS: 150.0000 mg | ORAL_TABLET | Freq: Every day | ORAL | 5 refills | Status: DC

## 2020-03-04 MED ORDER — ZOLPIDEM TARTRATE 5 MG PO TABS: 5.0000 mg | ORAL_TABLET | Freq: Every evening | ORAL | 5 refills | Status: DC | PRN

## 2020-03-04 MED FILL — ZOLPIDEM TARTRATE 5 MG TABS: 5 | 30 days supply | Qty: 30 | Fill #0

## 2020-03-04 MED FILL — SUBVENITE 150 MG TABS: 150 | 30 days supply | Qty: 30 | Fill #0

## 2020-03-04 MED FILL — clonazePAM 0.5 MG TABS: 0.5 | 30 days supply | Qty: 60 | Fill #0

## 2020-03-04 MED FILL — SERTRALINE HCL 100 MG TABS: 100 | 30 days supply | Qty: 60 | Fill #0
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
## 2020-03-10 ENCOUNTER — Other Ambulatory Visit: Payer: Self-pay | Admitting: Nurse Practitioner

## 2020-03-10 DIAGNOSIS — G8929 Other chronic pain: Secondary | ICD-10-CM

## 2020-05-28 MED FILL — SUBVENITE 150 MG TABS: 150 | 30 days supply | Qty: 30 | Fill #1

## 2020-05-28 MED FILL — clonazePAM 0.5 MG TABS: 0.5 | 30 days supply | Qty: 60 | Fill #1

## 2020-05-28 MED FILL — SERTRALINE HCL 100 MG TABS: 100 | 30 days supply | Qty: 60 | Fill #1

## 2020-06-03 ENCOUNTER — Telehealth (HOSPITAL_COMMUNITY): Payer: 59 | Admitting: Psychiatry

## 2020-06-22 DIAGNOSIS — L293 Anogenital pruritus, unspecified: Secondary | ICD-10-CM | POA: Diagnosis not present

## 2020-06-22 DIAGNOSIS — Z309 Encounter for contraceptive management, unspecified: Secondary | ICD-10-CM | POA: Diagnosis not present

## 2020-06-22 DIAGNOSIS — Z113 Encounter for screening for infections with a predominantly sexual mode of transmission: Secondary | ICD-10-CM | POA: Diagnosis not present

## 2020-06-22 DIAGNOSIS — Z6835 Body mass index (BMI) 35.0-35.9, adult: Secondary | ICD-10-CM | POA: Diagnosis not present

## 2020-06-22 DIAGNOSIS — D649 Anemia, unspecified: Secondary | ICD-10-CM | POA: Diagnosis not present

## 2020-06-22 DIAGNOSIS — Z01419 Encounter for gynecological examination (general) (routine) without abnormal findings: Secondary | ICD-10-CM | POA: Diagnosis not present

## 2020-06-22 LAB — HM PAP SMEAR

## 2020-06-22 LAB — RESULTS CONSOLE HPV: CHL HPV: NEGATIVE

## 2020-06-29 DIAGNOSIS — Z01419 Encounter for gynecological examination (general) (routine) without abnormal findings: Secondary | ICD-10-CM | POA: Diagnosis not present

## 2020-07-01 MED FILL — ZOLPIDEM TARTRATE 5 MG TAB: 5 | 30 days supply | Qty: 30 | Fill #1

## 2020-07-01 MED FILL — SUBVENITE 150 MG TABS: 150 | 30 days supply | Qty: 30 | Fill #1

## 2020-07-01 MED FILL — SERTRALINE HCL 100 MG TABS: 100 | 30 days supply | Qty: 60 | Fill #1

## 2020-07-01 MED FILL — clonazePAM 0.5 MG TABS: 0.5 | 30 days supply | Qty: 60 | Fill #1

## 2020-07-03 LAB — HM PAP SMEAR
HM Pap smear: ABNORMAL
HPV, high-risk: NEGATIVE

## 2020-07-14 ENCOUNTER — Other Ambulatory Visit: Payer: Self-pay

## 2020-07-15 ENCOUNTER — Ambulatory Visit: Payer: 59 | Admitting: Family Medicine

## 2020-09-03 ENCOUNTER — Other Ambulatory Visit (HOSPITAL_COMMUNITY): Payer: Self-pay | Admitting: Psychiatry

## 2020-09-15 DIAGNOSIS — L292 Pruritus vulvae: Secondary | ICD-10-CM | POA: Diagnosis not present

## 2020-09-15 DIAGNOSIS — N760 Acute vaginitis: Secondary | ICD-10-CM | POA: Diagnosis not present

## 2020-10-14 ENCOUNTER — Telehealth (INDEPENDENT_AMBULATORY_CARE_PROVIDER_SITE_OTHER): Payer: 59 | Admitting: Family Medicine

## 2020-10-14 ENCOUNTER — Encounter: Payer: Self-pay | Admitting: Family Medicine

## 2020-10-14 VITALS — Temp 98.3°F | Ht 59.0 in | Wt 171.0 lb

## 2020-10-14 DIAGNOSIS — J069 Acute upper respiratory infection, unspecified: Secondary | ICD-10-CM | POA: Diagnosis not present

## 2020-10-14 NOTE — Progress Notes (Signed)
[facility] [address] [place] [address] Dept: [phone] Dept Fax: [phone]  Virtual Video Visit  I connected with Julio Alm Riede on 10/14/20 at  9:30 AM EDT by a video enabled telemedicine application and verified that I am speaking with the correct person using two identifiers.  Location patient: Home Location provider: Clinic Persons participating in the virtual visit: Patient, Provider  I discussed the limitations of evaluation and management by telemedicine and the availability of in person appointments. The patient expressed understanding and agreed to proceed.  No chief complaint on file.   SUBJECTIVE:  HPI: Cheyenne Lozano is a 30 y.o. female who presents with a 2-3 day history of sore throat, headaches, and right ear pain. She has also noted some mild seasonal allergy symptoms recently, though she is not on any particular medications for this. Her throat is improving. She has been using Cepacol to help with her discomfort. Her headaches have been more generalized. She feels there may be a stress component. 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 She has been using Tylenol, aspirin, and Excedrin for her headaches. She is using an OTC ear drop for her ear apin. She did try looking in her throat, but did not see any white patches on her tonsils. She denies fever, cough, wheezing, or shortness of breath.  Patient Active Problem List   Diagnosis Date Noted  . Panic disorder 08/07/2019  . GAD (generalized anxiety disorder) 05/09/2019  . Bipolar 2 disorder (HCC) 05/09/2019  . Insomnia 04/26/2019  . Hyperglycemia 04/07/2019  . Upper back pain, chronic 04/07/2019  . Hallucination, visual 04/07/2019  . Myalgia 04/07/2019  . Polycystic ovary syndrome 09/12/2018  . Postpartum care and examination 07/25/2017   Past Surgical History:  Procedure Laterality Date  . BREAST  REDUCTION SURGERY Bilateral 02/26/2015   Procedure: BILATERAL BREAST REDUCTION  ;  Surgeon: Etter Sjogren, MD;  Location: [facility] OR;  Service: Plastics;  Laterality: Bilateral;  . CESAREAN SECTION N/A 06/28/2017   Procedure: CESAREAN SECTION;  Surgeon: Kathrynn Running, MD;  Location: [facility] BIRTHING SUITES;  Service: Obstetrics;  Laterality: N/A;  . REDUCTION MAMMAPLASTY Bilateral 02/26/2015  . WISDOM TOOTH EXTRACTION  ~ 2010   "bottom 2"   Family History  Problem Relation Age of Onset  . Diabetes Father   . Hypertension Father   . Stroke Father   . Schizophrenia Father   . Diabetes Maternal Grandmother   . Hypertension Maternal Grandmother   . Diabetes Maternal Grandfather   . Hypertension Maternal Grandfather   . Heart attack Maternal Grandfather   . Gout Maternal Grandfather   . Hypertension Paternal Grandmother   . Schizophrenia Other   . Schizophrenia Cousin    Social History   Tobacco Use  . Smoking status: Never Smoker  . Smokeless tobacco: Never Used  Vaping Use  . Vaping Use: Never used  Substance Use Topics  . Alcohol use: Yes    Comment: social  . Drug use: No    Current Outpatient Medications:  .  clonazePAM (KLONOPIN) 0.5 MG tablet, TAKE 1 TABLET (0.5 MG TOTAL) BY MOUTH 2 (TWO) TIMES DAILY AS NEEDED FOR ANXIETY., Disp: 60 tablet, Rfl: 5 .  ibuprofen (ADVIL) 600 MG tablet, Take 1 tablet (600 mg total) by mouth every 8 (eight) hours as needed (with food)., Disp: 60 tablet, Rfl: 1 .  sertraline (ZOLOFT) 100 MG tablet, TAKE 2 TABLETS BY MOUTH  DAILY., Disp: 60 tablet, Rfl: 5 .  zolpidem (AMBIEN) 5 MG tablet, TAKE 1 TABLET(5 MG) BY MOUTH AT BEDTIME AS NEEDED FOR SLEEP, Disp: 30 tablet, Rfl: 0 .  lamoTRIgine (LAMICTAL) 150 MG tablet, TAKE 1 TABLET BY MOUTH AT BEDTIME. (Patient not taking: Reported on 10/14/2020), Disp: 30 tablet, Rfl: 5 .  tiZANidine (ZANAFLEX) 4 MG capsule, Take 1 capsule (4 mg total) by mouth 3 (three) times daily as needed for muscle spasms. (Patient not  taking: Reported on 10/14/2020), Disp: 21 capsule, Rfl: 0  No Known Allergies  ROS: See pertinent positives and negatives per HPI.  OBSERVATIONS/OBJECTIVE:  VITALS per patient if applicable: Today's Vitals   10/14/20 0918  Temp: 98.3 F (36.8 C)  TempSrc: Temporal  Weight: 171 lb (77.6 kg)  Height: 4\' 11"  (1.499 m)   Body mass index is 34.54 kg/m.   GENERAL: Alert and oriented. Appears well and in no acute distress.  HEENT: Atraumatic. Conjunctiva clear. No obvious abnormalities on inspection of external nose and ears.  NECK: Normal movements of the head and neck.  LUNGS: On inspection, no signs of respiratory distress. Breathing rate appears normal. No obvious gross SOB, gasping or wheezing, and no conversational dyspnea.  CV: No obvious cyanosis.  PSYCH/NEURO: Pleasant and cooperative. No obvious depression or anxiety. Speech and thought processing grossly intact.  ASSESSMENT AND PLAN: 1. Viral upper respiratory illness Discussed home care for viral illness, including rest, pushing fluids, and OTC medications as needed for symptom relief. Recommend hot tea with honey for sore throat symptoms. Continue current approach to headache. Follow-up if needed for worsening or persistent symptoms.   I discussed the assessment and treatment plan with the patient. The patient was provided an opportunity to ask questions and all were answered. The patient agreed with the plan and demonstrated an understanding of the instructions.   The patient was advised to call back or seek an in-person evaluation if the symptoms worsen or if the condition fails to improve as anticipated.   , MD

## 2020-11-04 ENCOUNTER — Other Ambulatory Visit: Payer: Self-pay

## 2020-11-05 ENCOUNTER — Ambulatory Visit: Payer: 59 | Admitting: Nurse Practitioner

## 2020-11-05 ENCOUNTER — Encounter: Payer: Self-pay | Admitting: Nurse Practitioner

## 2020-11-05 ENCOUNTER — Other Ambulatory Visit (HOSPITAL_COMMUNITY): Payer: Self-pay

## 2020-11-05 VITALS — BP 120/62 | HR 80 | Temp 98.2°F | Ht 59.0 in | Wt 177.2 lb

## 2020-11-05 DIAGNOSIS — M5442 Lumbago with sciatica, left side: Secondary | ICD-10-CM | POA: Diagnosis not present

## 2020-11-05 DIAGNOSIS — G8929 Other chronic pain: Secondary | ICD-10-CM | POA: Diagnosis not present

## 2020-11-05 DIAGNOSIS — M549 Dorsalgia, unspecified: Secondary | ICD-10-CM | POA: Diagnosis not present

## 2020-11-05 MED ORDER — METHOCARBAMOL 500 MG PO TABS
500.0000 mg | ORAL_TABLET | Freq: Three times a day (TID) | ORAL | 0 refills | Status: DC | PRN
  Filled 2020-11-05: qty 21, 7d supply, fill #0

## 2020-11-05 MED ORDER — KETOROLAC TROMETHAMINE 30 MG/ML IJ SOLN: 30.0000 mg | Freq: Once | INTRAMUSCULAR | Status: AC

## 2020-11-05 MED ORDER — IBUPROFEN 600 MG PO TABS
600.0000 mg | ORAL_TABLET | Freq: Three times a day (TID) | ORAL | 0 refills | Status: DC | PRN
  Filled 2020-11-05: qty 60, 20d supply, fill #0

## 2020-11-05 MED ORDER — KETOROLAC TROMETHAMINE 60 MG/2ML IM SOLN
60.0000 mg | Freq: Once | INTRAMUSCULAR | Status: AC
  Administered 2020-11-05: 30 mg via INTRAMUSCULAR

## 2020-11-05 MED FILL — Sertraline HCl Tab 100 MG: ORAL | 30 days supply | Qty: 60 | Fill #0 | Status: AC

## 2020-11-05 NOTE — Addendum Note (Signed)
Addended by: Elyn Peers on: 11/05/2020 02:21 PM   Modules accepted: Orders

## 2020-11-05 NOTE — Patient Instructions (Addendum)
Do not combine ambien, klonopin, and robaxin due to risk of sedation.  Acute Back Pain, Adult Acute back pain is sudden and usually short-lived. It is often caused by an injury to the muscles and tissues in the back. The injury may result from:  A muscle or ligament getting overstretched or torn (strained). Ligaments are tissues that connect bones to each other. Lifting something improperly can cause a back strain.  Wear and tear (degeneration) of the spinal disks. Spinal disks are circular tissue that provide cushioning between the bones of the spine (vertebrae).  Twisting motions, such as while playing sports or doing yard work.  A hit to the back.  Arthritis. You may have a physical exam, lab tests, and imaging tests to find the cause of your pain. Acute back pain usually goes away with rest and home care. Follow these instructions at home: Managing pain, stiffness, and swelling  Treatment may include medicines for pain and inflammation that are taken by mouth or applied to the skin, prescription pain medicine, or muscle relaxants. Take over-the-counter and prescription medicines only as told by your health care provider.  Your health care provider may recommend applying ice during the first 24-48 hours after your pain starts. To do this: ? Put ice in a plastic bag. ? Place a towel between your skin and the bag. ? Leave the ice on for 20 minutes, 2-3 times a day.  If directed, apply heat to the affected area as often as told by your health care provider. Use the heat source that your health care provider recommends, such as a moist heat pack or a heating pad. ? Place a towel between your skin and the heat source. ? Leave the heat on for 20-30 minutes. ? Remove the heat if your skin turns bright red. This is especially important if you are unable to feel pain, heat, or cold. You have a greater risk of getting burned. Activity  Do not stay in bed. Staying in bed for more than 1-2 days  can delay your recovery.  Sit up and stand up straight. Avoid leaning forward when you sit or hunching over when you stand. ? If you work at a desk, sit close to it so you do not need to lean over. Keep your chin tucked in. Keep your neck drawn back, and keep your elbows bent at a 90-degree angle (right angle). ? Sit high and close to the steering wheel when you drive. Add lower back (lumbar) support to your car seat, if needed.  Take short walks on even surfaces as soon as you are able. Try to increase the length of time you walk each day.  Do not sit, drive, or stand in one place for more than 30 minutes at a time. Sitting or standing for long periods of time can put stress on your back.  Do not drive or use heavy machinery while taking prescription pain medicine.  Use proper lifting techniques. When you bend and lift, use positions that put less stress on your back: ? Estelline your knees. ? Keep the load close to your body. ? Avoid twisting.  Exercise regularly as told by your health care provider. Exercising helps your back heal faster and helps prevent back injuries by keeping muscles strong and flexible.  Work with a physical therapist to make a safe exercise program, as recommended by your health care provider. Do any exercises as told by your physical therapist.   Lifestyle  Maintain a healthy weight.  Extra weight puts stress on your back and makes it difficult to have good posture.  Avoid activities or situations that make you feel anxious or stressed. Stress and anxiety increase muscle tension and can make back pain worse. Learn ways to manage anxiety and stress, such as through exercise. General instructions  Sleep on a firm mattress in a comfortable position. Try lying on your side with your knees slightly bent. If you lie on your back, put a pillow under your knees.  Follow your treatment plan as told by your health care provider. This may include: ? Cognitive or behavioral  therapy. ? Acupuncture or massage therapy. ? Meditation or yoga. Contact a health care provider if:  You have pain that is not relieved with rest or medicine.  You have increasing pain going down into your legs or buttocks.  Your pain does not improve after 2 weeks.  You have pain at night.  You lose weight without trying.  You have a fever or chills. Get help right away if:  You develop new bowel or bladder control problems.  You have unusual weakness or numbness in your arms or legs.  You develop nausea or vomiting.  You develop abdominal pain.  You feel faint. Summary  Acute back pain is sudden and usually short-lived.  Use proper lifting techniques. When you bend and lift, use positions that put less stress on your back.  Take over-the-counter and prescription medicines and apply heat or ice as directed by your health care provider. This information is not intended to replace advice given to you by your health care provider. Make sure you discuss any questions you have with your health care provider. Document Revised: 02/28/2020 Document Reviewed: 02/28/2020 Elsevier Patient Education  2021 [employer].

## 2020-11-05 NOTE — Progress Notes (Signed)
Subjective:  Patient ID: Cheyenne Lozano, female    DOB: 01-23-1991  Age: 30 y.o. MRN: [id number]  CC: Acute Visit (Pt c/o back pain and right side/leg pain for years but has flared up bad in the past 4 days. )  Back Pain This is a recurrent problem. The current episode started 1 to 4 weeks ago. The problem occurs intermittently. The problem has been waxing and waning since onset. The pain is present in the lumbar spine. The quality of the pain is described as aching. The pain radiates to the left thigh. The symptoms are aggravated by twisting and bending. Associated symptoms include leg pain. Pertinent negatives include no abdominal pain, bladder incontinence, bowel incontinence, chest pain, dysuria, fever, headaches, numbness, paresis, paresthesias, pelvic pain, perianal numbness, tingling, weakness or weight loss. Risk factors include obesity, poor posture and lack of exercise. She has tried NSAIDs and heat for the symptoms. The treatment provided no relief.  acute exacerbation after roller coaster ride 1week ago Reviewed past Medical, Social and Family history today.  Outpatient Medications Prior to Visit  Medication Sig Dispense Refill  . sertraline (ZOLOFT) 100 MG tablet TAKE 2 TABLETS BY MOUTH DAILY. 60 tablet 5  . zolpidem (AMBIEN) 5 MG tablet TAKE 1 TABLET(5 MG) BY MOUTH AT BEDTIME AS NEEDED FOR SLEEP 30 tablet 0  . clonazePAM (KLONOPIN) 0.5 MG tablet TAKE 1 TABLET (0.5 MG TOTAL) BY MOUTH 2 (TWO) TIMES DAILY AS NEEDED FOR ANXIETY. 60 tablet 5  . ibuprofen (ADVIL) 600 MG tablet Take 1 tablet (600 mg total) by mouth every 8 (eight) hours as needed (with food). (Patient not taking: Reported on 11/05/2020) 60 tablet 1  . lamoTRIgine (LAMICTAL) 150 MG tablet TAKE 1 TABLET BY MOUTH AT BEDTIME. (Patient not taking: No sig reported) 30 tablet 5  . tiZANidine (ZANAFLEX) 4 MG capsule Take 1 capsule (4 mg total) by mouth 3 (three) times daily as needed for muscle spasms. (Patient not taking: No sig  reported) 21 capsule 0   No facility-administered medications prior to visit.    ROS See HPI  Objective:  BP 120/62 (BP Location: Left Arm, Patient Position: Sitting, Cuff Size: Normal)   Pulse 80   Temp 98.2 F (36.8 C) (Temporal)   Ht 4\' 11"  (1.499 m)   Wt 177 lb 3.2 oz (80.4 kg)   LMP 10/17/2020 (Exact Date)   SpO2 98%   BMI 35.79 kg/m   Physical Exam Vitals reviewed.  Constitutional:      Appearance: She is obese.  Musculoskeletal:     Lumbar back: Spasms and tenderness present. No swelling, deformity or bony tenderness. Normal range of motion. Negative right straight leg raise test and negative left straight leg raise test.  Neurological:     Mental Status: She is alert.    Assessment & Plan:  This visit occurred during the SARS-CoV-2 public health emergency.  Safety protocols were in place, including screening questions prior to the visit, additional usage of staff PPE, and extensive cleaning of exam room while observing appropriate contact time as indicated for disinfecting solutions.   Grethel was seen today for acute visit.  Diagnoses and all orders for this visit:  Chronic bilateral low back pain with left-sided sciatica -     ketorolac (TORADOL) 30 MG/ML injection 30 mg -     methocarbamol (ROBAXIN) 500 MG tablet; Take 1 tablet by mouth every 8 hours as needed for muscle spasms. -     ibuprofen (ADVIL) 600 MG tablet;  Take 1 tablet  by mouth every 8  hours as needed with food  Other acute back pain    Problem List Items Addressed This Visit   None   Visit Diagnoses    Chronic bilateral low back pain with left-sided sciatica    -  Primary   Relevant Medications   ketorolac (TORADOL) 30 MG/ML injection 30 mg   methocarbamol (ROBAXIN) 500 MG tablet   ibuprofen (ADVIL) 600 MG tablet   Other acute back pain       Relevant Medications   ketorolac (TORADOL) 30 MG/ML injection 30 mg   methocarbamol (ROBAXIN) 500 MG tablet   ibuprofen (ADVIL) 600 MG tablet       Follow-up: Return if symptoms worsen or fail to improve.  Alysia Penna, NP

## 2020-11-09 ENCOUNTER — Other Ambulatory Visit (HOSPITAL_COMMUNITY): Payer: Self-pay

## 2020-11-09 ENCOUNTER — Other Ambulatory Visit (HOSPITAL_COMMUNITY): Payer: Self-pay | Admitting: Psychiatry

## 2020-11-10 ENCOUNTER — Other Ambulatory Visit (HOSPITAL_COMMUNITY): Payer: Self-pay

## 2020-11-11 ENCOUNTER — Other Ambulatory Visit (HOSPITAL_COMMUNITY): Payer: Self-pay

## 2020-11-13 ENCOUNTER — Other Ambulatory Visit (HOSPITAL_COMMUNITY): Payer: Self-pay

## 2020-11-13 ENCOUNTER — Other Ambulatory Visit (HOSPITAL_COMMUNITY): Payer: Self-pay | Admitting: Psychiatry

## 2020-11-17 ENCOUNTER — Ambulatory Visit (HOSPITAL_COMMUNITY): Payer: Self-pay

## 2020-11-17 ENCOUNTER — Other Ambulatory Visit (HOSPITAL_COMMUNITY): Payer: Self-pay | Admitting: Psychiatry

## 2020-11-17 ENCOUNTER — Other Ambulatory Visit (HOSPITAL_COMMUNITY): Payer: Self-pay

## 2020-11-18 ENCOUNTER — Other Ambulatory Visit (HOSPITAL_COMMUNITY): Payer: Self-pay

## 2021-01-26 ENCOUNTER — Other Ambulatory Visit (HOSPITAL_COMMUNITY): Payer: Self-pay

## 2021-01-26 ENCOUNTER — Encounter: Payer: Self-pay | Admitting: Nurse Practitioner

## 2021-01-26 ENCOUNTER — Telehealth (INDEPENDENT_AMBULATORY_CARE_PROVIDER_SITE_OTHER): Payer: 59 | Admitting: Nurse Practitioner

## 2021-01-26 ENCOUNTER — Other Ambulatory Visit: Payer: Self-pay

## 2021-01-26 VITALS — Temp 99.0°F | Ht 59.0 in | Wt 169.0 lb

## 2021-01-26 DIAGNOSIS — R5383 Other fatigue: Secondary | ICD-10-CM | POA: Diagnosis not present

## 2021-01-26 DIAGNOSIS — U071 COVID-19: Secondary | ICD-10-CM | POA: Diagnosis not present

## 2021-01-26 DIAGNOSIS — A084 Viral intestinal infection, unspecified: Secondary | ICD-10-CM

## 2021-01-26 MED ORDER — ONDANSETRON HCL 4 MG PO TABS
4.0000 mg | ORAL_TABLET | Freq: Three times a day (TID) | ORAL | 0 refills | Status: DC | PRN
  Filled 2021-01-26: qty 21, 7d supply, fill #0

## 2021-01-26 NOTE — Patient Instructions (Signed)
Send photo of home covid test via mychart. Maintain Brat diet and adequate oral hydration. Advance diet as tolerated Use zofran as needed for nausea. May add probiotic (curturelle or florastor) 1cap daily. FMLA form (prolonged covid symptoms) will be completed 01/26/2021-02/09/2021. If GI symptoms have not improved in 1week, call office.

## 2021-01-26 NOTE — Progress Notes (Signed)
Virtual Visit via Video Note  I connected withNAME@ on 01/26/21 at 10:30 AM EDT by a video enabled telemedicine application and verified that I am speaking with the correct person using two identifiers.  Location: Patient:Home Provider: Office Participants: patient and provider  I discussed the limitations of evaluation and management by telemedicine and the availability of in person appointments. I also discussed with the patient that there may be a patient responsible charge related to this service. The patient expressed understanding and agreed to proceed.  HY:IFOYDXA and diarrhea  History of Present Illness: Ms. Cronce reports persistent diarrhea,nausea and fatigue x 10days.She had positive home COVID test 10days ago and yesterday. She denies any upper respiratory symptoms, no fever, and no blood in stool. GI symptoms are trigger by food ingestion. She has 3 or less loose stools per day. She also reports persistent fatigue which interferes with performing daily ADLs. She works 12hrs 3days/week. She does not think she is able to return to work at this time.   Observations/Objective: Physical Exam Vitals reviewed.  Constitutional:      General: She is not in acute distress. Pulmonary:     Effort: Pulmonary effort is normal.  Neurological:     Mental Status: She is alert and oriented to person, place, and time.    Assessment and Plan: Michaeline was seen today for acute visit.  Diagnoses and all orders for this visit:  Viral gastroenteritis -     ondansetron (ZOFRAN) 4 MG tablet; Take 1 tablet (4 mg total) by mouth every 8 (eight) hours as needed for nausea or vomiting.  Fatigue, unspecified type  COVID-19  Follow Up Instructions: Send photo of home covid test via mychart. Maintain Brat diet and adequate oral hydration. Advance diet as tolerated Use zofran as needed for nausea. May add probiotic (curturelle or florastor) 1cap daily. FMLA form (prolonged covid symptoms) will  be completed 01/26/2021-02/09/2021. If GI symptoms have not improved in 1week, call office.  I discussed the assessment and treatment plan with the patient. The patient was provided an opportunity to ask questions and all were answered. The patient agreed with the plan and demonstrated an understanding of the instructions.   The patient was advised to call back or seek an in-person evaluation if the symptoms worsen or if the condition fails to improve as anticipated.  Alysia Penna, NP

## 2021-02-08 NOTE — Telephone Encounter (Signed)
Pt called checking in on her paperwork. I do not see it as finished up front, so please advise.

## 2021-06-15 DIAGNOSIS — N760 Acute vaginitis: Secondary | ICD-10-CM | POA: Diagnosis not present

## 2021-06-15 DIAGNOSIS — Z113 Encounter for screening for infections with a predominantly sexual mode of transmission: Secondary | ICD-10-CM | POA: Diagnosis not present

## 2022-01-06 DIAGNOSIS — Z9889 Other specified postprocedural states: Secondary | ICD-10-CM | POA: Diagnosis not present

## 2022-01-06 DIAGNOSIS — Z6835 Body mass index (BMI) 35.0-35.9, adult: Secondary | ICD-10-CM | POA: Diagnosis not present

## 2022-01-06 DIAGNOSIS — Z113 Encounter for screening for infections with a predominantly sexual mode of transmission: Secondary | ICD-10-CM | POA: Diagnosis not present

## 2022-01-06 DIAGNOSIS — D649 Anemia, unspecified: Secondary | ICD-10-CM | POA: Diagnosis not present

## 2022-01-06 DIAGNOSIS — Z8759 Personal history of other complications of pregnancy, childbirth and the puerperium: Secondary | ICD-10-CM | POA: Diagnosis not present

## 2022-01-06 DIAGNOSIS — Z3201 Encounter for pregnancy test, result positive: Secondary | ICD-10-CM | POA: Diagnosis not present

## 2022-01-06 DIAGNOSIS — Z8632 Personal history of gestational diabetes: Secondary | ICD-10-CM | POA: Diagnosis not present

## 2022-01-06 DIAGNOSIS — N760 Acute vaginitis: Secondary | ICD-10-CM | POA: Diagnosis not present

## 2022-01-06 DIAGNOSIS — Z01419 Encounter for gynecological examination (general) (routine) without abnormal findings: Secondary | ICD-10-CM | POA: Diagnosis not present

## 2022-01-26 DIAGNOSIS — N911 Secondary amenorrhea: Secondary | ICD-10-CM | POA: Diagnosis not present

## 2022-02-03 DIAGNOSIS — Z3A23 23 weeks gestation of pregnancy: Secondary | ICD-10-CM | POA: Diagnosis not present

## 2022-02-03 DIAGNOSIS — Z3400 Encounter for supervision of normal first pregnancy, unspecified trimester: Secondary | ICD-10-CM | POA: Diagnosis not present

## 2022-02-03 DIAGNOSIS — Z3685 Encounter for antenatal screening for Streptococcus B: Secondary | ICD-10-CM | POA: Diagnosis not present

## 2022-02-03 DIAGNOSIS — Z3481 Encounter for supervision of other normal pregnancy, first trimester: Secondary | ICD-10-CM | POA: Diagnosis not present

## 2022-02-03 DIAGNOSIS — Z3402 Encounter for supervision of normal first pregnancy, second trimester: Secondary | ICD-10-CM | POA: Diagnosis not present

## 2022-02-03 LAB — OB RESULTS CONSOLE ABO/RH: RH Type: POSITIVE

## 2022-02-03 LAB — OB RESULTS CONSOLE RPR
RPR: NONREACTIVE
RPR: NONREACTIVE

## 2022-02-03 LAB — OB RESULTS CONSOLE HEPATITIS B SURFACE ANTIGEN
Hepatitis B Surface Ag: NEGATIVE
Hepatitis B Surface Ag: NEGATIVE

## 2022-02-03 LAB — OB RESULTS CONSOLE HIV ANTIBODY (ROUTINE TESTING)
HIV: NONREACTIVE
HIV: NONREACTIVE

## 2022-02-03 LAB — OB RESULTS CONSOLE RUBELLA ANTIBODY, IGM
Rubella: IMMUNE
Rubella: IMMUNE

## 2022-02-03 LAB — HEPATITIS C ANTIBODY
HCV Ab: NEGATIVE
HCV Ab: NEGATIVE

## 2022-02-03 LAB — OB RESULTS CONSOLE ANTIBODY SCREEN: Antibody Screen: NEGATIVE

## 2022-02-22 DIAGNOSIS — Z3A10 10 weeks gestation of pregnancy: Secondary | ICD-10-CM | POA: Diagnosis not present

## 2022-02-22 DIAGNOSIS — R112 Nausea with vomiting, unspecified: Secondary | ICD-10-CM | POA: Diagnosis not present

## 2022-02-22 DIAGNOSIS — Z8632 Personal history of gestational diabetes: Secondary | ICD-10-CM | POA: Diagnosis not present

## 2022-02-22 DIAGNOSIS — Z3402 Encounter for supervision of normal first pregnancy, second trimester: Secondary | ICD-10-CM | POA: Diagnosis not present

## 2022-02-22 DIAGNOSIS — Z3400 Encounter for supervision of normal first pregnancy, unspecified trimester: Secondary | ICD-10-CM | POA: Diagnosis not present

## 2022-02-22 DIAGNOSIS — Z331 Pregnant state, incidental: Secondary | ICD-10-CM | POA: Diagnosis not present

## 2022-02-22 DIAGNOSIS — Z113 Encounter for screening for infections with a predominantly sexual mode of transmission: Secondary | ICD-10-CM | POA: Diagnosis not present

## 2022-02-23 LAB — OB RESULTS CONSOLE GC/CHLAMYDIA
Chlamydia: NEGATIVE
Chlamydia: NEGATIVE
Neisseria Gonorrhea: NEGATIVE
Neisseria Gonorrhea: NEGATIVE

## 2022-03-03 ENCOUNTER — Encounter: Payer: Self-pay | Admitting: Student

## 2022-03-03 ENCOUNTER — Inpatient Hospital Stay (HOSPITAL_COMMUNITY)
Admission: AD | Admit: 2022-03-03 | Discharge: 2022-03-03 | Disposition: A | Discharge status: Home / Self Care | Payer: 59 | Attending: Obstetrics and Gynecology | Admitting: Obstetrics and Gynecology

## 2022-03-03 DIAGNOSIS — O26891 Other specified pregnancy related conditions, first trimester: Secondary | ICD-10-CM | POA: Insufficient documentation

## 2022-03-03 DIAGNOSIS — R55 Syncope and collapse: Secondary | ICD-10-CM | POA: Insufficient documentation

## 2022-03-03 DIAGNOSIS — D649 Anemia, unspecified: Secondary | ICD-10-CM | POA: Insufficient documentation

## 2022-03-03 DIAGNOSIS — R42 Dizziness and giddiness: Secondary | ICD-10-CM | POA: Diagnosis not present

## 2022-03-03 DIAGNOSIS — Z8759 Personal history of other complications of pregnancy, childbirth and the puerperium: Secondary | ICD-10-CM

## 2022-03-03 DIAGNOSIS — Z98891 History of uterine scar from previous surgery: Secondary | ICD-10-CM | POA: Insufficient documentation

## 2022-03-03 DIAGNOSIS — Z3A12 12 weeks gestation of pregnancy: Secondary | ICD-10-CM | POA: Diagnosis not present

## 2022-03-03 DIAGNOSIS — Z8632 Personal history of gestational diabetes: Secondary | ICD-10-CM

## 2022-03-03 DIAGNOSIS — A493 Mycoplasma infection, unspecified site: Secondary | ICD-10-CM | POA: Insufficient documentation

## 2022-03-03 HISTORY — DX: Personal history of other complications of pregnancy, childbirth and the puerperium: Z87.59

## 2022-03-03 HISTORY — DX: Personal history of gestational diabetes: Z86.32

## 2022-03-03 LAB — URINALYSIS, ROUTINE W REFLEX MICROSCOPIC
Bilirubin Urine: NEGATIVE
Glucose, UA: NEGATIVE mg/dL
Hgb urine dipstick: NEGATIVE
Ketones, ur: NEGATIVE mg/dL
Leukocytes,Ua: NEGATIVE
Nitrite: NEGATIVE
Protein, ur: NEGATIVE mg/dL
Specific Gravity, Urine: 1.02 (ref 1.005–1.030)
pH: 5 (ref 5.0–8.0)

## 2022-03-03 LAB — GLUCOSE, CAPILLARY: Glucose-Capillary: 94 mg/dL (ref 70–99)

## 2022-03-03 LAB — COMPREHENSIVE METABOLIC PANEL
ALT: 19 U/L (ref 0–44)
AST: 18 U/L (ref 15–41)
Albumin: 3.5 g/dL (ref 3.5–5.0)
Alkaline Phosphatase: 49 U/L (ref 38–126)
Anion gap: 10 (ref 5–15)
BUN: 8 mg/dL (ref 6–20)
CO2: 22 mmol/L (ref 22–32)
Calcium: 9.6 mg/dL (ref 8.9–10.3)
Chloride: 107 mmol/L (ref 98–111)
Creatinine, Ser: 0.68 mg/dL (ref 0.44–1.00)
GFR, Estimated: >60 mL/min (ref >60)
Glucose, Bld: 98 mg/dL (ref 70–99)
Potassium: 4.3 mmol/L (ref 3.5–5.1)
Sodium: 139 mmol/L (ref 135–145)
Total Bilirubin: 0.5 mg/dL (ref 0.3–1.2)
Total Protein: 7.3 g/dL (ref 6.5–8.1)

## 2022-03-03 LAB — CBC
HCT: 37.7 % (ref 36.0–46.0)
Hemoglobin: 12.3 g/dL (ref 12.0–15.0)
MCH: 27.5 pg (ref 26.0–34.0)
MCHC: 32.6 g/dL (ref 30.0–36.0)
MCV: 84.3 fL (ref 80.0–100.0)
Platelets: 285 10*3/uL (ref 150–400)
RBC: 4.47 MIL/uL (ref 3.87–5.11)
RDW: 13.3 % (ref 11.5–15.5)
WBC: 7.8 10*3/uL (ref 4.0–10.5)
nRBC: 0 % (ref 0.0–0.2)

## 2022-03-03 NOTE — MAU Note (Signed)
.  Cheyenne Lozano is a 32 y.o. at [redacted]w[redacted]d here in MAU reporting: pt was at work and passed. out around 7:30 this morning. Felt a little dizzy this morning. Drank fluid  but did not eat. (Works in Engineer, site at [employer]). Coworkers  put her on a stretcher and checked b/p 147/96. Ate crackers and juice still feels a little shakey.  LMP:  Onset of complaint: 7:30 Pain score: 0 There were no vitals filed for this visit.   FHT:145 Lab orders placed from triage:

## 2022-03-03 NOTE — MAU Provider Note (Signed)
History     [id number]  Arrival date and time: 03/03/22 1011    Chief Complaint  Patient presents with   Dizziness     HPI Cheyenne Lozano is a 31 y.o. at [redacted]w[redacted]d who presents for dizziness & syncope. Symptoms started this morning. 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 She sat down because she felt like she was going to pass out, then woke up on a stretcher. Doesn't know how long she was out & doesn't think she hit her head. Continues to feel symptoms of lightheadedness when she is standing. Denies headache, fever, chest pain, palpitations, shortness of breath. Denies n/v/d, vaginal bleeding, or abdominal pain.  Has been taking diclegis - last dose was last night. No other new medications. Denies history of cardiac issues or syncopal episodes.  Had gatorade this morning but had not eaten since last night. Was given crackers after passing out.    OB History     Gravida  4   Para  1   Term  1   Preterm      AB  2   Living  1      SAB  1   IAB  1   Ectopic      Multiple  0   Live Births  1           Past Medical History:  Diagnosis Date   Bipolar disorder (HCC) 05/09/2019   Breast hypertrophy in female 02/26/2015   Generalized anxiety disorder 05/09/2019   History of gestational diabetes mellitus 03/03/2022   History of pre-eclampsia 03/03/2022   asa 12 wga   Type II diabetes mellitus (HCC)    "actually I'm prediabetic; dr. put me on RX to get A1C #'s down" (02/26/2015)    Past Surgical History:  Procedure Laterality Date   BREAST REDUCTION SURGERY Bilateral 02/26/2015   Procedure: BILATERAL BREAST REDUCTION  ;  Surgeon: Etter Sjogren, MD;  Location: [facility] OR;  Service: Plastics;  Laterality: Bilateral;   CESAREAN SECTION N/A 06/28/2017   Procedure: CESAREAN SECTION;  Surgeon: Kathrynn Running, MD;  Location: [facility];  Service: Obstetrics;  Laterality: N/A;   REDUCTION MAMMAPLASTY Bilateral 02/26/2015   WISDOM TOOTH EXTRACTION  ~  2010   "bottom 2"    Family History  Problem Relation Age of Onset   Diabetes Father    Hypertension Father    Stroke Father    Schizophrenia Father    Diabetes Maternal Grandmother    Hypertension Maternal Grandmother    Diabetes Maternal Grandfather    Hypertension Maternal Grandfather    Heart attack Maternal Grandfather    Gout Maternal Grandfather    Hypertension Paternal Grandmother    Schizophrenia Other    Schizophrenia Cousin     No Known Allergies  No current facility-administered medications on file prior to encounter.   Current Outpatient Medications on File Prior to Encounter  Medication Sig Dispense Refill   Doxylamine-Pyridoxine 10-10 MG TBEC Take 2 tablets by mouth daily.       ROS Pertinent positives and negative per HPI, all others reviewed and negative  Physical Exam   BP 123/76   Pulse 79   Temp 97.9 F (36.6 C)   Resp 18   Ht 4' 11.25" (1.505 m)   Wt 75.8 kg   LMP 12/08/2021   SpO2 100%   BMI 33.45 kg/m   Patient Vitals for the past 24 hrs:  BP Temp Pulse Resp SpO2  Height Weight  03/03/22 1259 -- -- -- 18 100 % -- --  03/03/22 1027 123/76 97.9 F (36.6 C) 79 18 -- 4' 11.25" (1.505 m) 75.8 kg   Orthostatic VS for the past 24 hrs:  BP- Lying Pulse- Lying BP- Sitting Pulse- Sitting BP- Standing at 0 minutes Pulse- Standing at 0 minutes  03/03/22 1259 119/79 62 120/75 63 119/80 88      Physical Exam Vitals and nursing note reviewed.  Constitutional:      General: She is not in acute distress.    Appearance: Normal appearance.  HENT:     Head: Normocephalic and atraumatic.  Eyes:     General: No scleral icterus.    Conjunctiva/sclera: Conjunctivae normal.  Cardiovascular:     Rate and Rhythm: Normal rate and regular rhythm.     Heart sounds: Normal heart sounds.  Pulmonary:     Effort: Pulmonary effort is normal. No respiratory distress.     Breath sounds: Normal breath sounds. No wheezing.  Skin:    General: Skin is warm  and dry.  Neurological:     Mental Status: She is alert.  Psychiatric:        Mood and Affect: Mood normal.        Behavior: Behavior normal.      Labs Results for orders placed or performed during the hospital encounter of 03/03/22 (from the past 24 hour(s))  Glucose, capillary     Status: None   Collection Time: 03/03/22 10:35 AM  Result Value Ref Range   Glucose-Capillary 94 70 - 99 mg/dL  Urinalysis, Routine w reflex microscopic Urine, Clean Catch     Status: Abnormal   Collection Time: 03/03/22 10:57 AM  Result Value Ref Range   Color, Urine YELLOW YELLOW   APPearance CLOUDY (A) CLEAR   Specific Gravity, Urine 1.020 1.005 - 1.030   pH 5.0 5.0 - 8.0   Glucose, UA NEGATIVE NEGATIVE mg/dL   Hgb urine dipstick NEGATIVE NEGATIVE   Bilirubin Urine NEGATIVE NEGATIVE   Ketones, ur NEGATIVE NEGATIVE mg/dL   Protein, ur NEGATIVE NEGATIVE mg/dL   Nitrite NEGATIVE NEGATIVE   Leukocytes,Ua NEGATIVE NEGATIVE  CBC     Status: None   Collection Time: 03/03/22  1:11 PM  Result Value Ref Range   WBC 7.8 4.0 - 10.5 K/uL   RBC 4.47 3.87 - 5.11 MIL/uL   Hemoglobin 12.3 12.0 - 15.0 g/dL   HCT 78.4 69.6 - 29.5 %   MCV 84.3 80.0 - 100.0 fL   MCH 27.5 26.0 - 34.0 pg   MCHC 32.6 30.0 - 36.0 g/dL   RDW 28.4 13.2 - 44.0 %   Platelets 285 150 - 400 K/uL   nRBC 0.0 0.0 - 0.2 %  Comprehensive metabolic panel     Status: None   Collection Time: 03/03/22  1:11 PM  Result Value Ref Range   Sodium 139 135 - 145 mmol/L   Potassium 4.3 3.5 - 5.1 mmol/L   Chloride 107 98 - 111 mmol/L   CO2 22 22 - 32 mmol/L   Glucose, Bld 98 70 - 99 mg/dL   BUN 8 6 - 20 mg/dL   Creatinine, Ser 1.02 0.44 - 1.00 mg/dL   Calcium 9.6 8.9 - 72.5 mg/dL   Total Protein 7.3 6.5 - 8.1 g/dL   Albumin 3.5 3.5 - 5.0 g/dL   AST 18 15 - 41 U/L   ALT 19 0 - 44 U/L   Alkaline Phosphatase 49 38 -  126 U/L   Total Bilirubin 0.5 0.3 - 1.2 mg/dL   GFR, Estimated >32 >20 mL/min   Anion gap 10 5 - 15    Imaging No results  found.  MAU Course  Procedures Lab Orders         Urinalysis, Routine w reflex microscopic Urine, Clean Catch         Glucose, capillary         CBC         Comprehensive metabolic panel    No orders of the defined types were placed in this encounter.  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 Hasn't eaten since last night. No symptoms other than lightheadedness.  EKG reviewed by Dr. Doneen Poisson Orthostatic VS - no hypotension but did have increase in HR on standing  Labs ordered & reviewed - normal Assessment and Plan   1. Syncope, unspecified syncope type   2. [redacted] weeks gestation of pregnancy    -Eat prior to work and every 3 hours -Drink plenty of fluids during the day -Reviewed reasons to return to MAU -If issues become recurrent, may need cardiology referral  Judeth Horn, NP 03/03/22 2:49 PM

## 2022-04-21 DIAGNOSIS — Z363 Encounter for antenatal screening for malformations: Secondary | ICD-10-CM | POA: Diagnosis not present

## 2022-04-21 DIAGNOSIS — Z3A19 19 weeks gestation of pregnancy: Secondary | ICD-10-CM | POA: Diagnosis not present

## 2022-04-21 DIAGNOSIS — Z3482 Encounter for supervision of other normal pregnancy, second trimester: Secondary | ICD-10-CM | POA: Diagnosis not present

## 2022-04-21 DIAGNOSIS — Z3685 Encounter for antenatal screening for Streptococcus B: Secondary | ICD-10-CM | POA: Diagnosis not present

## 2022-06-14 DIAGNOSIS — Z3483 Encounter for supervision of other normal pregnancy, third trimester: Secondary | ICD-10-CM | POA: Diagnosis not present

## 2022-06-14 DIAGNOSIS — Z3482 Encounter for supervision of other normal pregnancy, second trimester: Secondary | ICD-10-CM | POA: Diagnosis not present

## 2022-06-23 DIAGNOSIS — Z3A28 28 weeks gestation of pregnancy: Secondary | ICD-10-CM | POA: Diagnosis not present

## 2022-06-23 DIAGNOSIS — Z23 Encounter for immunization: Secondary | ICD-10-CM | POA: Diagnosis not present

## 2022-06-23 DIAGNOSIS — Z3480 Encounter for supervision of other normal pregnancy, unspecified trimester: Secondary | ICD-10-CM | POA: Diagnosis not present

## 2022-06-23 DIAGNOSIS — Z3400 Encounter for supervision of normal first pregnancy, unspecified trimester: Secondary | ICD-10-CM | POA: Diagnosis not present

## 2022-07-21 DIAGNOSIS — Z3A32 32 weeks gestation of pregnancy: Secondary | ICD-10-CM | POA: Diagnosis not present

## 2022-07-21 DIAGNOSIS — O26843 Uterine size-date discrepancy, third trimester: Secondary | ICD-10-CM | POA: Diagnosis not present

## 2022-07-21 DIAGNOSIS — Z3400 Encounter for supervision of normal first pregnancy, unspecified trimester: Secondary | ICD-10-CM | POA: Diagnosis not present

## 2022-08-02 ENCOUNTER — Inpatient Hospital Stay (HOSPITAL_COMMUNITY)
Admission: AD | Admit: 2022-08-02 | Discharge: 2022-08-02 | Disposition: A | Discharge status: Home / Self Care | Payer: 59 | Attending: Obstetrics and Gynecology | Admitting: Obstetrics and Gynecology

## 2022-08-02 ENCOUNTER — Encounter (HOSPITAL_COMMUNITY): Payer: Self-pay | Admitting: Obstetrics and Gynecology

## 2022-08-02 DIAGNOSIS — O4703 False labor before 37 completed weeks of gestation, third trimester: Secondary | ICD-10-CM | POA: Diagnosis not present

## 2022-08-02 DIAGNOSIS — O479 False labor, unspecified: Secondary | ICD-10-CM

## 2022-08-02 DIAGNOSIS — Z3A33 33 weeks gestation of pregnancy: Secondary | ICD-10-CM | POA: Diagnosis not present

## 2022-08-02 LAB — URINALYSIS, ROUTINE W REFLEX MICROSCOPIC
Bilirubin Urine: NEGATIVE
Glucose, UA: NEGATIVE mg/dL
Hgb urine dipstick: NEGATIVE
Ketones, ur: NEGATIVE mg/dL
Leukocytes,Ua: NEGATIVE
Nitrite: NEGATIVE
Protein, ur: NEGATIVE mg/dL
Specific Gravity, Urine: 1.011 (ref 1.005–1.030)
pH: 7 (ref 5.0–8.0)

## 2022-08-02 LAB — WET PREP, GENITAL
Clue Cells Wet Prep HPF POC: NONE SEEN
Sperm: NONE SEEN
Trich, Wet Prep: NONE SEEN
WBC, Wet Prep HPF POC: >=10 — AB (ref <10)
Yeast Wet Prep HPF POC: NONE SEEN

## 2022-08-02 LAB — FETAL FIBRONECTIN: Fetal Fibronectin: NEGATIVE

## 2022-08-02 LAB — OB RESULTS CONSOLE GC/CHLAMYDIA
Chlamydia: NEGATIVE
Neisseria Gonorrhea: NEGATIVE

## 2022-08-02 LAB — POCT FERN TEST: POCT Fern Test: NEGATIVE

## 2022-08-02 NOTE — MAU Provider Note (Addendum)
History     CSN: [id number]  Arrival date and time: 08/02/22 1850   Event Date/Time   First Provider Initiated Contact with Patient 08/02/22 1944      Chief Complaint  Patient presents with   Vaginal Discharge   Contractions   Vaginal Discharge The patient's primary symptoms include vaginal discharge. Pertinent negatives include no abdominal pain, back pain, chills, diarrhea, dysuria, fever, flank pain, nausea, rash, sore throat or vomiting.   Patient is 32 y.o. [id number] with complaints of contractions today while at work. 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 Today despite water and rest she was having strong contractions. She reports also today new onset milky discharge that is in her underwear and has led to her changing her underwear.  She reports no complications in this pregnancy  +FM, denies LOF, VB, contractions, vaginal discharge.   OB History     Gravida  4   Para  1   Term  1   Preterm      AB  2   Living  1      SAB  1   IAB  1   Ectopic      Multiple  0   Live Births  1           Past Medical History:  Diagnosis Date   Bipolar disorder ([place] 05/09/2019   Breast hypertrophy in female 02/26/2015   Generalized anxiety disorder 05/09/2019   History of gestational diabetes mellitus 03/03/2022   History of pre-eclampsia 03/03/2022   asa 12 wga   Type II diabetes mellitus ([place]    "actually I'm prediabetic; dr. put me on RX to get A1C #'s down" (02/26/2015)    Past Surgical History:  Procedure Laterality Date   BREAST REDUCTION SURGERY Bilateral 02/26/2015   Procedure: BILATERAL BREAST REDUCTION  ;  Surgeon: DCrissie Reese MD;  [facility]  Service: Plastics;  Laterality: Bilateral;   CESAREAN SECTION N/A 06/28/2017   Procedure: CESAREAN SECTION;  Surgeon: WGwynne Edinger MD;  [facility]  Service: Obstetrics;  Laterality: N/A;   REDUCTION MAMMAPLASTY Bilateral 02/26/2015   WISDOM  TOOTH EXTRACTION  ~ 2010   "bottom 2"    Family History  Problem Relation Age of Onset   Diabetes Father    Hypertension Father    Stroke Father    Schizophrenia Father    Diabetes Maternal Grandmother    Hypertension Maternal Grandmother    Diabetes Maternal Grandfather    Hypertension Maternal Grandfather    Heart attack Maternal Grandfather    Gout Maternal Grandfather    Hypertension Paternal Grandmother    Schizophrenia Other    Schizophrenia Cousin     Social History   Tobacco Use   Smoking status: Never   Smokeless tobacco: Never  Vaping Use   Vaping Use: Never used  Substance Use Topics   Alcohol use: Not Currently    Comment: social   Drug use: No    Allergies: No Known Allergies  Medications Prior to Admission  Medication Sig Dispense Refill Last Dose   Prenatal Vit-Fe Fumarate-FA (PRENATAL MULTIVITAMIN) TABS tablet Take 1 tablet by mouth daily at 12 noon.   08/02/2022   Doxylamine-Pyridoxine 10-10 MG TBEC Take 2 tablets by mouth daily.       Review of Systems  Constitutional:  Negative for chills and fever.  HENT:  Negative for congestion and sore throat.   Eyes:  Negative for pain and visual disturbance.  Respiratory:  Negative for cough, chest tightness and shortness of breath.   Cardiovascular:  Negative for chest pain.  Gastrointestinal:  Negative for abdominal pain, diarrhea, nausea and vomiting.  Endocrine: Negative for cold intolerance and heat intolerance.  Genitourinary:  Positive for vaginal discharge. Negative for dysuria and flank pain.  Musculoskeletal:  Negative for back pain.  Skin:  Negative for rash.  Allergic/Immunologic: Negative for food allergies.  Neurological:  Negative for dizziness and light-headedness.  Psychiatric/Behavioral:  Negative for agitation.    Physical Exam   Blood pressure 132/77, pulse 90, temperature 98.2 F (36.8 C), temperature source Oral, resp. rate 18, height 4' 11"$  (1.499 m), weight 79 kg, last  menstrual period 12/08/2021, SpO2 99 %.  Physical Exam Vitals and nursing note reviewed.  Constitutional:      General: She is not in acute distress.    Appearance: She is well-developed.     Comments: Pregnant female  HENT:     Head: Normocephalic and atraumatic.  Eyes:     General: No scleral icterus.    Conjunctiva/sclera: Conjunctivae normal.  Cardiovascular:     Rate and Rhythm: Normal rate.  Pulmonary:     Effort: Pulmonary effort is normal.  Chest:     Chest wall: No tenderness.  Abdominal:     Palpations: Abdomen is soft.     Tenderness: There is no abdominal tenderness. There is no guarding or rebound.     Comments: Gravid 33 weeks, vertex by leopold's and on vaginal exam  Genitourinary:    Vagina: Normal.  Musculoskeletal:        General: Normal range of motion.     Cervical back: Normal range of motion and neck supple.  Skin:    General: Skin is warm and dry.     Findings: No rash.  Neurological:     Mental Status: She is alert and oriented to person, place, and time.   Dilation: Closed Effacement (%): Thick Cervical Position: Posterior Station: Ballotable Presentation: Vertex Exam by:: Lauretta Chester  Results for orders placed or performed during the hospital encounter of 08/02/22 (from the past 24 hour(s))  Urinalysis, Routine w reflex microscopic -Urine, Clean Catch     Status: None   Collection Time: 08/02/22  7:25 PM  Result Value Ref Range   Color, Urine YELLOW YELLOW   APPearance CLEAR CLEAR   Specific Gravity, Urine 1.011 1.005 - 1.030   pH 7.0 5.0 - 8.0   Glucose, UA NEGATIVE NEGATIVE mg/dL   Hgb urine dipstick NEGATIVE NEGATIVE   Bilirubin Urine NEGATIVE NEGATIVE   Ketones, ur NEGATIVE NEGATIVE mg/dL   Protein, ur NEGATIVE NEGATIVE mg/dL   Nitrite NEGATIVE NEGATIVE   Leukocytes,Ua NEGATIVE NEGATIVE  Wet prep, genital     Status: Abnormal   Collection Time: 08/02/22  8:12 PM   Specimen: PATH Cytology Cervicovaginal Ancillary Only  Result  Value Ref Range   Yeast Wet Prep HPF POC NONE SEEN NONE SEEN   Trich, Wet Prep NONE SEEN NONE SEEN   Clue Cells Wet Prep HPF POC NONE SEEN NONE SEEN   WBC, Wet Prep HPF POC >=10 (A) <10   Sperm NONE SEEN   Fern Test     Status: None   Collection Time: 08/02/22  9:05 PM  Result Value Ref Range   POCT Fern Test Negative = intact amniotic membranes     MAU Course  Procedures  MDM: high  This patient presents to the  ED for concern of   Chief Complaint  Patient presents with   Vaginal Discharge   Contractions     These complains involves an extensive number of treatment options, and is a complaint that carries with it a high risk of complications and morbidity.  The differential diagnosis for  1. Vaginal discharge INCLUDES preterm rupture of membranes, infection, normal leukorrhea  2. Contractions INCLUDES preterm labor, braxton hicks contractions.   Co morbidities that complicate the patient evaluation: history of GDM  Additional history obtained from mother  External records from outside source obtained and reviewed including Prenatal care records  Lab Tests: Other GC/CT, Wet prep, FFN, UA  I ordered, and personally interpreted labs.  The pertinent results include:  Wet prep with WBC, no other findings. UA was clear of infection.   Medicines ordered and prescription drug management:  Medications: none   Reevaluation of the patient after these medicines showed that the patient improved I have reviewed the patients home medicines and have made adjustments as needed  Test Considered: IVF-- not warranted. No vomiting and UOP WNL   MAU Course:  NST: 145/moderate/+Accels, no decels Toco: irritable but no contractions.   9:45 PM reviewed labs with patient and partner. Ok to discharge.  After the interventions noted above, I reevaluated the patient and found that they have :improved  Dispostion: discharged   Assessment and Plan   1. Threatened preterm labor, third  trimester   2. [redacted] weeks gestation of pregnancy   3. Braxton Hick's contraction    - Monitor contractions - Follow up at primary OB group on 2/15 - Reviewed return precautions  Caren Macadam 08/02/2022, 9:06 PM

## 2022-08-02 NOTE — MAU Note (Signed)
.  Cheyenne Lozano is a 32 y.o. at [redacted]w[redacted]d here in MAU reporting: since the weekend pt has had increase in pelvic pressure and lightening crotch, and today all day the pressure and pain is constant and more intense, possible ctx that are more frequent, as well as milky discharge that started today. Pt endorses FM. Pt denies VB, LOF, recent intercourse, and complications in the pregnancy.  C/S x1, 3/24 repeat C/S scheduled  Onset of complaint: 1000 Pain score: 5/10 Vitals:   08/02/22 1926  BP: 132/77  Pulse: 89  Resp: 18  Temp: 98.2 F (36.8 C)  SpO2: 100%     FHT:150 Lab orders placed from triage:  ua

## 2022-08-04 LAB — GC/CHLAMYDIA PROBE AMP (~~LOC~~) NOT AT ARMC
Chlamydia: NEGATIVE
Comment: NEGATIVE
Comment: NORMAL
Neisseria Gonorrhea: NEGATIVE

## 2022-08-17 DIAGNOSIS — Z3685 Encounter for antenatal screening for Streptococcus B: Secondary | ICD-10-CM | POA: Diagnosis not present

## 2022-08-17 LAB — OB RESULTS CONSOLE GBS
GBS: NEGATIVE
GBS: NEGATIVE

## 2022-08-25 ENCOUNTER — Telehealth (HOSPITAL_COMMUNITY): Payer: Self-pay | Admitting: *Deleted

## 2022-08-25 ENCOUNTER — Encounter (HOSPITAL_COMMUNITY): Payer: Self-pay

## 2022-08-25 NOTE — H&P (Signed)
Cheyenne Lozano is a 32 y.o. female presenting for scheduled RCS. Hx CS - arrest of dilation at 8cm of 7#3 infant. She has a hx PIH and has been on baby asa until 43 wga. Antenatal testing reassuring. Hx BPD and no meds. Hx mycoplasma in early pregnancy with negative TOC. Expecting a RR girl - "[name]".    OB History     Gravida  4   Para  1   Term  1   Preterm      AB  2   Living  1      SAB  1   IAB  1   Ectopic      Multiple  0   Live Births  1          Past Medical History:  Diagnosis Date   Bipolar disorder ([place]) 05/09/2019   Breast hypertrophy in female 02/26/2015   Generalized anxiety disorder 05/09/2019   History of gestational diabetes mellitus 03/03/2022   History of pre-eclampsia 03/03/2022   asa 12 wga   Type II diabetes mellitus ([place])    "actually I'm prediabetic; dr. put me on RX to get A1C #'s down" (02/26/2015)   Past Surgical History:  Procedure Laterality Date   BREAST REDUCTION SURGERY Bilateral 02/26/2015   Procedure: BILATERAL BREAST REDUCTION  ;  Surgeon: Crissie Reese, MD;  Location: [place];  Service: Plastics;  Laterality: Bilateral;   CESAREAN SECTION N/A 06/28/2017   Procedure: CESAREAN SECTION;  Surgeon: Gwynne Edinger, MD;  Location: [facility];  Service: Obstetrics;  Laterality: N/A;   REDUCTION MAMMAPLASTY Bilateral 02/26/2015   WISDOM TOOTH EXTRACTION  ~ 2010   "bottom 2"   Family History: family history includes Diabetes in her father, maternal grandfather, and maternal grandmother; Gout in her maternal grandfather; Heart attack in her maternal grandfather; Hypertension in her father, maternal grandfather, maternal grandmother, and paternal grandmother; Schizophrenia in her cousin, father, and another family member; Stroke in her father. Social History:  reports that she has never smoked. She has never used smokeless tobacco. She reports that she does not currently use alcohol. She reports that she does not use drugs.      Maternal Diabetes: No Genetic Screening: Normal Maternal Ultrasounds/Referrals: Normal Fetal Ultrasounds or other Referrals:  None Maternal Substance Abuse:  No Significant Maternal Medications:  None Significant Maternal Lab Results:  None Number of Prenatal Visits:greater than 3 verified prenatal visits Other Comments:  None  Review of Systems History   Last menstrual period 12/08/2021. Exam Physical Exam  (from office) NAD, A&O NWOB Abd soft, nondistended, gravid  Prenatal labs: ABO, Rh:   Antibody:   Rubella:   RPR:    HBsAg:    HIV:    GBS:     Assessment/Plan:  32yo presenting for scheduled RCS. Risks discussed including infection, bleeding, damage to surrounding structures, the need for additional procedures including hysterectomy, and the possibility of uterine rupture with neonatal morbidity/mortality, scarring, and abnormal placentation with subsequent pregnancies. Patient agrees to proceed. 2g ancef on call to OR.     Tyson Dense 08/25/2022, 8:50 AM

## 2022-08-25 NOTE — Telephone Encounter (Signed)
Preadmission screen  

## 2022-08-25 NOTE — Patient Instructions (Signed)
Cheyenne Lozano  08/25/2022   Your procedure is scheduled on:  09/08/2022  Arrive at [facility] at Entrance C on [address] at [facility]  and [address]. You are invited to use the FREE valet parking or use the Visitor's parking deck.  Pick up the phone at the desk and dial [phone].  Call this number if you have problems the morning of surgery: [phone]  Remember:   Do not eat food:(After Midnight) Desps de medianoche.  Do not drink clear liquids: (After Midnight) Desps de medianoche.  Take these medicines the morning of surgery with A SIP OF WATER:  none   Do not wear jewelry, make-up or nail polish.  Do not wear lotions, powders, or perfumes. Do not wear deodorant.  Do not shave 48 hours prior to surgery.  Do not bring valuables to the hospital.  [facility] is not   responsible for any belongings or valuables brought to the hospital.  Contacts, dentures or bridgework may not be worn into surgery.  Leave suitcase in the car. After surgery it may be brought to your room.  For patients admitted to the hospital, checkout time is 11:00 AM the day of              discharge.      Please read over the following fact sheets that you were given:     Preparing for Surgery

## 2022-08-26 ENCOUNTER — Encounter (HOSPITAL_COMMUNITY): Payer: Self-pay

## 2022-08-26 ENCOUNTER — Telehealth (HOSPITAL_COMMUNITY): Payer: Self-pay | Admitting: *Deleted

## 2022-08-26 NOTE — Telephone Encounter (Signed)
Preadmission screen  

## 2022-09-02 ENCOUNTER — Inpatient Hospital Stay (HOSPITAL_COMMUNITY)
Admission: AD | Admit: 2022-09-02 | Discharge: 2022-09-03 | Disposition: A | Discharge status: Home / Self Care | Payer: 59 | Attending: Obstetrics & Gynecology | Admitting: Obstetrics & Gynecology

## 2022-09-02 DIAGNOSIS — O24113 Pre-existing diabetes mellitus, type 2, in pregnancy, third trimester: Secondary | ICD-10-CM | POA: Insufficient documentation

## 2022-09-02 DIAGNOSIS — Z3689 Encounter for other specified antenatal screening: Secondary | ICD-10-CM

## 2022-09-02 DIAGNOSIS — O26893 Other specified pregnancy related conditions, third trimester: Secondary | ICD-10-CM | POA: Insufficient documentation

## 2022-09-02 DIAGNOSIS — T7491XA Unspecified adult maltreatment, confirmed, initial encounter: Secondary | ICD-10-CM

## 2022-09-02 DIAGNOSIS — O09293 Supervision of pregnancy with other poor reproductive or obstetric history, third trimester: Secondary | ICD-10-CM | POA: Insufficient documentation

## 2022-09-02 DIAGNOSIS — Z3A38 38 weeks gestation of pregnancy: Secondary | ICD-10-CM | POA: Insufficient documentation
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
## 2022-09-03 ENCOUNTER — Encounter (HOSPITAL_COMMUNITY): Payer: Self-pay | Admitting: Obstetrics & Gynecology

## 2022-09-03 DIAGNOSIS — O24113 Pre-existing diabetes mellitus, type 2, in pregnancy, third trimester: Secondary | ICD-10-CM | POA: Diagnosis not present

## 2022-09-03 DIAGNOSIS — O09293 Supervision of pregnancy with other poor reproductive or obstetric history, third trimester: Secondary | ICD-10-CM | POA: Diagnosis not present

## 2022-09-03 DIAGNOSIS — Z3A38 38 weeks gestation of pregnancy: Secondary | ICD-10-CM | POA: Diagnosis not present

## 2022-09-03 DIAGNOSIS — T7491XA Unspecified adult maltreatment, confirmed, initial encounter: Secondary | ICD-10-CM | POA: Diagnosis not present

## 2022-09-03 DIAGNOSIS — Z3689 Encounter for other specified antenatal screening: Secondary | ICD-10-CM

## 2022-09-03 DIAGNOSIS — O26893 Other specified pregnancy related conditions, third trimester: Secondary | ICD-10-CM | POA: Diagnosis not present

## 2022-09-03 DIAGNOSIS — Z3A37 37 weeks gestation of pregnancy: Secondary | ICD-10-CM | POA: Diagnosis not present

## 2022-09-03 NOTE — Consult Note (Signed)
       FOR MORE INFORMATION AND SUPPORT:    [facility] [place] 9025064617 or [email or web address]  [phone] [facility]   [place]   [phone]    Forensic Nursing Program [phone], please leave a confidential voicemail and we will return your call.
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
## 2022-09-03 NOTE — MAU Note (Signed)
Sane Nurse at bedside.
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
## 2022-09-06 ENCOUNTER — Encounter (HOSPITAL_COMMUNITY)
Admission: RE | Admit: 2022-09-06 | Discharge: 2022-09-06 | Disposition: A | Discharge status: Home / Self Care | Payer: 59 | Source: Ambulatory Visit | Attending: Obstetrics and Gynecology | Admitting: Obstetrics and Gynecology

## 2022-09-06 DIAGNOSIS — Z01812 Encounter for preprocedural laboratory examination: Secondary | ICD-10-CM | POA: Insufficient documentation

## 2022-09-06 LAB — CBC
HCT: 30.6 % — ABNORMAL LOW (ref 36.0–46.0)
Hemoglobin: 9.5 g/dL — ABNORMAL LOW (ref 12.0–15.0)
MCH: 26 pg (ref 26.0–34.0)
MCHC: 31 g/dL (ref 30.0–36.0)
MCV: 83.8 fL (ref 80.0–100.0)
Platelets: 264 10*3/uL (ref 150–400)
RBC: 3.65 MIL/uL — ABNORMAL LOW (ref 3.87–5.11)
RDW: 14.4 % (ref 11.5–15.5)
WBC: 7 10*3/uL (ref 4.0–10.5)
nRBC: 0 % (ref 0.0–0.2)

## 2022-09-06 LAB — TYPE AND SCREEN
ABO/RH(D): O POS
Antibody Screen: NEGATIVE

## 2022-09-06 LAB — RPR: RPR Ser Ql: NONREACTIVE

## 2022-09-06 LAB — OB RESULTS CONSOLE RPR: RPR: NONREACTIVE

## 2022-09-07 NOTE — Anesthesia Preprocedure Evaluation (Signed)
Anesthesia Evaluation  Patient identified by MRN, date of birth, ID band Patient awake    Reviewed: Allergy & Precautions, NPO status , Patient's Chart, lab work & pertinent test results  Airway Mallampati: II  TM Distance: >3 FB Neck ROM: Full    Dental  (+) Teeth Intact, Dental Advisory Given   Pulmonary    breath sounds clear to auscultation       Cardiovascular negative cardio ROS  Rhythm:Regular Rate:Normal     Neuro/Psych  PSYCHIATRIC DISORDERS Anxiety  Bipolar Disorder      GI/Hepatic negative GI ROS, Neg liver ROS,,,  Endo/Other  diabetes, Gestational    Renal/GU negative Renal ROS     Musculoskeletal   Abdominal   Peds  Hematology   Anesthesia Other Findings   Reproductive/Obstetrics (+) Pregnancy                             Anesthesia Physical Anesthesia Plan  ASA: 2  Anesthesia Plan: Spinal   Post-op Pain Management: Toradol IV (intra-op)* and Ofirmev IV (intra-op)*   Induction:   PONV Risk Score and Plan: 0 and Ondansetron  Airway Management Planned: Natural Airway  Additional Equipment: None  Intra-op Plan:   Post-operative Plan:   Informed Consent: I have reviewed the patients History and Physical, chart, labs and discussed the procedure including the risks, benefits and alternatives for the proposed anesthesia with the patient or authorized representative who has indicated his/her understanding and acceptance.       Plan Discussed with: CRNA  Anesthesia Plan Comments: (Lab Results      Component                Value               Date                      WBC                      7.0                 09/06/2022                HGB                      9.5 (L)             09/06/2022                HCT                      30.6 (L)            09/06/2022                MCV                      83.8                09/06/2022                PLT                       264                 09/06/2022           )  Anesthesia Quick Evaluation  

## 2022-09-08 ENCOUNTER — Encounter (HOSPITAL_COMMUNITY): Payer: Self-pay | Admitting: Obstetrics and Gynecology

## 2022-09-08 ENCOUNTER — Inpatient Hospital Stay (HOSPITAL_COMMUNITY)
Admission: AD | Admit: 2022-09-08 | Discharge: 2022-09-10 | DRG: 788 | Disposition: A | Discharge status: Home / Self Care | Payer: 59 | Attending: Obstetrics and Gynecology | Admitting: Obstetrics and Gynecology

## 2022-09-08 ENCOUNTER — Inpatient Hospital Stay (HOSPITAL_COMMUNITY): Payer: 59 | Admitting: Anesthesiology

## 2022-09-08 ENCOUNTER — Other Ambulatory Visit: Payer: Self-pay

## 2022-09-08 ENCOUNTER — Encounter (HOSPITAL_COMMUNITY)
Admission: AD | Disposition: A | Discharge status: Home / Self Care | Payer: Self-pay | Source: Home / Self Care | Attending: Obstetrics and Gynecology

## 2022-09-08 DIAGNOSIS — O24429 Gestational diabetes mellitus in childbirth, unspecified control: Secondary | ICD-10-CM | POA: Diagnosis not present

## 2022-09-08 DIAGNOSIS — O34211 Maternal care for low transverse scar from previous cesarean delivery: Principal | ICD-10-CM | POA: Diagnosis present

## 2022-09-08 DIAGNOSIS — O34219 Maternal care for unspecified type scar from previous cesarean delivery: Secondary | ICD-10-CM | POA: Diagnosis not present

## 2022-09-08 DIAGNOSIS — Z3A39 39 weeks gestation of pregnancy: Secondary | ICD-10-CM

## 2022-09-08 DIAGNOSIS — Z98891 History of uterine scar from previous surgery: Principal | ICD-10-CM

## 2022-09-08 DIAGNOSIS — Z3490 Encounter for supervision of normal pregnancy, unspecified, unspecified trimester: Secondary | ICD-10-CM

## 2022-09-08 SURGERY — Surgical Case
Anesthesia: Spinal | Site: Abdomen

## 2022-09-08 MED ORDER — KETOROLAC TROMETHAMINE 30 MG/ML IJ SOLN
INTRAMUSCULAR | Status: AC
  Filled 2022-09-08: qty 1

## 2022-09-08 MED ORDER — SODIUM CHLORIDE 0.9 % IV SOLN
INTRAVENOUS | Status: AC
  Filled 2022-09-08: qty 5

## 2022-09-08 MED ORDER — ONDANSETRON HCL 4 MG/2ML IJ SOLN
INTRAMUSCULAR | Status: AC
  Filled 2022-09-08: qty 2

## 2022-09-08 MED ORDER — MEPERIDINE HCL 25 MG/ML IJ SOLN: 6.2500 mg | INTRAMUSCULAR | Status: DC | PRN

## 2022-09-08 MED ORDER — ACETAMINOPHEN 10 MG/ML IV SOLN
INTRAVENOUS | Status: AC
  Filled 2022-09-08: qty 100

## 2022-09-08 MED ORDER — MORPHINE SULFATE (PF) 0.5 MG/ML IJ SOLN
INTRAMUSCULAR | Status: AC
  Filled 2022-09-08: qty 10

## 2022-09-08 MED ORDER — FENTANYL CITRATE (PF) 100 MCG/2ML IJ SOLN
INTRAMUSCULAR | Status: DC | PRN
  Administered 2022-09-08: 15 ug via INTRATHECAL

## 2022-09-08 MED ORDER — STERILE WATER FOR IRRIGATION IR SOLN
Status: DC | PRN
  Administered 2022-09-08: 1

## 2022-09-08 MED ORDER — KETOROLAC TROMETHAMINE 30 MG/ML IJ SOLN
INTRAMUSCULAR | Status: DC | PRN
  Administered 2022-09-08: 30 mg via INTRAVENOUS

## 2022-09-08 MED ORDER — MENTHOL 3 MG MT LOZG: 1.0000 | LOZENGE | OROMUCOSAL | Status: DC | PRN

## 2022-09-08 MED ORDER — ONDANSETRON HCL 4 MG/2ML IJ SOLN
INTRAMUSCULAR | Status: DC | PRN
  Administered 2022-09-08: 4 mg via INTRAVENOUS

## 2022-09-08 MED ORDER — LACTATED RINGERS IV SOLN: INTRAVENOUS | Status: DC | PRN

## 2022-09-08 MED ORDER — ACETAMINOPHEN 10 MG/ML IV SOLN: 1000.0000 mg | Freq: Once | INTRAVENOUS | Status: DC | PRN

## 2022-09-08 MED ORDER — COCONUT OIL OIL
1.0000 | TOPICAL_OIL | Status: DC | PRN
  Administered 2022-09-09: 1 via TOPICAL

## 2022-09-08 MED ORDER — EPHEDRINE SULFATE-NACL 50-0.9 MG/10ML-% IV SOSY
PREFILLED_SYRINGE | INTRAVENOUS | Status: DC | PRN
  Administered 2022-09-08: 5 mg via INTRAVENOUS
  Administered 2022-09-08: 10 mg via INTRAVENOUS

## 2022-09-08 MED ORDER — DEXAMETHASONE SODIUM PHOSPHATE 4 MG/ML IJ SOLN
INTRAMUSCULAR | Status: AC
  Filled 2022-09-08: qty 1

## 2022-09-08 MED ORDER — FENTANYL CITRATE (PF) 100 MCG/2ML IJ SOLN
INTRAMUSCULAR | Status: AC
  Filled 2022-09-08: qty 2

## 2022-09-08 MED ORDER — FENTANYL CITRATE (PF) 100 MCG/2ML IJ SOLN: 25.0000 ug | INTRAMUSCULAR | Status: DC | PRN

## 2022-09-08 MED ORDER — ACETAMINOPHEN 160 MG/5ML PO SOLN: 325.0000 mg | ORAL | Status: DC | PRN

## 2022-09-08 MED ORDER — CEFAZOLIN SODIUM-DEXTROSE 2-4 GM/100ML-% IV SOLN
INTRAVENOUS | Status: AC
  Filled 2022-09-08: qty 100

## 2022-09-08 MED ORDER — SODIUM CHLORIDE 0.9 % IR SOLN
Status: DC | PRN
  Administered 2022-09-08: 1

## 2022-09-08 MED ORDER — OXYTOCIN-SODIUM CHLORIDE 30-0.9 UT/500ML-% IV SOLN: 2.5000 [IU]/h | INTRAVENOUS | Status: AC

## 2022-09-08 MED ORDER — CEFAZOLIN SODIUM-DEXTROSE 2-4 GM/100ML-% IV SOLN
2.0000 g | INTRAVENOUS | Status: AC
  Administered 2022-09-08: 2 g via INTRAVENOUS

## 2022-09-08 MED ORDER — PROMETHAZINE HCL 25 MG/ML IJ SOLN: 6.2500 mg | INTRAMUSCULAR | Status: DC | PRN

## 2022-09-08 MED ORDER — SENNOSIDES-DOCUSATE SODIUM 8.6-50 MG PO TABS
2.0000 | ORAL_TABLET | Freq: Every day | ORAL | Status: DC
  Administered 2022-09-09 – 2022-09-10 (×2): 2 via ORAL
  Filled 2022-09-08 (×2): qty 2

## 2022-09-08 MED ORDER — KETOROLAC TROMETHAMINE 30 MG/ML IJ SOLN: 30.0000 mg | Freq: Four times a day (QID) | INTRAMUSCULAR | Status: AC | PRN

## 2022-09-08 MED ORDER — SCOPOLAMINE 1 MG/3DAYS TD PT72: 1.0000 | MEDICATED_PATCH | Freq: Once | TRANSDERMAL | Status: DC

## 2022-09-08 MED ORDER — ACETAMINOPHEN 325 MG PO TABS: 325.0000 mg | ORAL_TABLET | ORAL | Status: DC | PRN

## 2022-09-08 MED ORDER — DIBUCAINE (PERIANAL) 1 % EX OINT: 1.0000 | TOPICAL_OINTMENT | CUTANEOUS | Status: DC | PRN

## 2022-09-08 MED ORDER — EPHEDRINE 5 MG/ML INJ
INTRAVENOUS | Status: AC
  Filled 2022-09-08: qty 5

## 2022-09-08 MED ORDER — SIMETHICONE 80 MG PO CHEW: 80.0000 mg | CHEWABLE_TABLET | ORAL | Status: DC | PRN

## 2022-09-08 MED ORDER — LACTATED RINGERS IV SOLN: INTRAVENOUS | Status: DC

## 2022-09-08 MED ORDER — OXYTOCIN-SODIUM CHLORIDE 30-0.9 UT/500ML-% IV SOLN
INTRAVENOUS | Status: DC | PRN
  Administered 2022-09-08: 300 mL via INTRAVENOUS

## 2022-09-08 MED ORDER — HYDROMORPHONE HCL 1 MG/ML IJ SOLN
0.2000 mg | INTRAMUSCULAR | Status: DC | PRN
  Administered 2022-09-10: 0.2 mg via INTRAVENOUS
  Administered 2022-09-10: 0.4 mg via INTRAVENOUS
  Filled 2022-09-08 (×2): qty 1

## 2022-09-08 MED ORDER — ONDANSETRON HCL 4 MG/2ML IJ SOLN: 4.0000 mg | Freq: Three times a day (TID) | INTRAMUSCULAR | Status: DC | PRN

## 2022-09-08 MED ORDER — SODIUM CHLORIDE 0.9 % IV SOLN: 500.0000 mg | INTRAVENOUS | Status: DC

## 2022-09-08 MED ORDER — IBUPROFEN 600 MG PO TABS
600.0000 mg | ORAL_TABLET | Freq: Four times a day (QID) | ORAL | Status: DC
  Administered 2022-09-09 – 2022-09-10 (×5): 600 mg via ORAL
  Filled 2022-09-08 (×5): qty 1

## 2022-09-08 MED ORDER — SOD CITRATE-CITRIC ACID 500-334 MG/5ML PO SOLN: 30.0000 mL | ORAL | Status: DC

## 2022-09-08 MED ORDER — SIMETHICONE 80 MG PO CHEW
80.0000 mg | CHEWABLE_TABLET | Freq: Three times a day (TID) | ORAL | Status: DC
  Administered 2022-09-08 – 2022-09-10 (×5): 80 mg via ORAL
  Filled 2022-09-08 (×5): qty 1

## 2022-09-08 MED ORDER — KETOROLAC TROMETHAMINE 30 MG/ML IJ SOLN
30.0000 mg | Freq: Four times a day (QID) | INTRAMUSCULAR | Status: AC
  Administered 2022-09-08 – 2022-09-09 (×3): 30 mg via INTRAVENOUS
  Filled 2022-09-08 (×3): qty 1

## 2022-09-08 MED ORDER — DEXAMETHASONE SODIUM PHOSPHATE 10 MG/ML IJ SOLN
INTRAMUSCULAR | Status: DC | PRN
  Administered 2022-09-08: 4 mg via INTRAVENOUS

## 2022-09-08 MED ORDER — NALOXONE HCL 4 MG/10ML IJ SOLN: 1.0000 ug/kg/h | INTRAVENOUS | Status: DC | PRN

## 2022-09-08 MED ORDER — NALOXONE HCL 0.4 MG/ML IJ SOLN: 0.4000 mg | INTRAMUSCULAR | Status: DC | PRN

## 2022-09-08 MED ORDER — OXYTOCIN-SODIUM CHLORIDE 30-0.9 UT/500ML-% IV SOLN
INTRAVENOUS | Status: AC
  Filled 2022-09-08: qty 500

## 2022-09-08 MED ORDER — PRENATAL MULTIVITAMIN CH
1.0000 | ORAL_TABLET | Freq: Every day | ORAL | Status: DC
  Administered 2022-09-09 – 2022-09-10 (×2): 1 via ORAL
  Filled 2022-09-08 (×2): qty 1

## 2022-09-08 MED ORDER — BUPIVACAINE IN DEXTROSE 0.75-8.25 % IT SOLN
INTRATHECAL | Status: DC | PRN
  Administered 2022-09-08: 1.4 mL via INTRATHECAL

## 2022-09-08 MED ORDER — PHENYLEPHRINE HCL-NACL 20-0.9 MG/250ML-% IV SOLN
INTRAVENOUS | Status: DC | PRN
  Administered 2022-09-08: 60 ug/min via INTRAVENOUS

## 2022-09-08 MED ORDER — DIPHENHYDRAMINE HCL 25 MG PO CAPS
25.0000 mg | ORAL_CAPSULE | Freq: Four times a day (QID) | ORAL | Status: DC | PRN
  Administered 2022-09-08 (×2): 25 mg via ORAL
  Filled 2022-09-08 (×2): qty 1

## 2022-09-08 MED ORDER — ZOLPIDEM TARTRATE 5 MG PO TABS: 5.0000 mg | ORAL_TABLET | Freq: Every evening | ORAL | Status: DC | PRN

## 2022-09-08 MED ORDER — MORPHINE SULFATE (PF) 0.5 MG/ML IJ SOLN
INTRAMUSCULAR | Status: DC | PRN
  Administered 2022-09-08: 150 ug via INTRATHECAL

## 2022-09-08 MED ORDER — WITCH HAZEL-GLYCERIN EX PADS: 1.0000 | MEDICATED_PAD | CUTANEOUS | Status: DC | PRN

## 2022-09-08 MED ORDER — SODIUM CHLORIDE 0.9% FLUSH: 3.0000 mL | INTRAVENOUS | Status: DC | PRN

## 2022-09-08 MED ORDER — ACETAMINOPHEN 10 MG/ML IV SOLN
INTRAVENOUS | Status: DC | PRN
  Administered 2022-09-08: 1000 mg via INTRAVENOUS

## 2022-09-08 MED ORDER — ACETAMINOPHEN 500 MG PO TABS
1000.0000 mg | ORAL_TABLET | Freq: Four times a day (QID) | ORAL | Status: DC
  Administered 2022-09-08 – 2022-09-10 (×8): 1000 mg via ORAL
  Filled 2022-09-08 (×9): qty 2

## 2022-09-08 MED ORDER — PHENYLEPHRINE HCL-NACL 20-0.9 MG/250ML-% IV SOLN
INTRAVENOUS | Status: AC
  Filled 2022-09-08: qty 250

## 2022-09-08 MED ORDER — OXYCODONE HCL 5 MG PO TABS
5.0000 mg | ORAL_TABLET | ORAL | Status: DC | PRN
  Administered 2022-09-09 (×3): 5 mg via ORAL
  Administered 2022-09-10: 10 mg via ORAL
  Filled 2022-09-08: qty 1
  Filled 2022-09-08: qty 2
  Filled 2022-09-08 (×2): qty 1

## 2022-09-08 SURGICAL SUPPLY — 32 items
APL PRP STRL LF DISP 70% ISPRP (MISCELLANEOUS) ×2
APL SKNCLS STERI-STRIP NONHPOA (GAUZE/BANDAGES/DRESSINGS) ×1
BENZOIN TINCTURE PRP APPL 2/3 (GAUZE/BANDAGES/DRESSINGS) ×1 IMPLANT
CHLORAPREP W/TINT 26 (MISCELLANEOUS) ×2 IMPLANT
CLAMP UMBILICAL CORD (MISCELLANEOUS) ×1 IMPLANT
CLOTH BEACON ORANGE TIMEOUT ST (SAFETY) ×1 IMPLANT
CLSR STERI-STRIP ANTIMIC 1/2X4 (GAUZE/BANDAGES/DRESSINGS) ×1 IMPLANT
DRSG OPSITE POSTOP 4X10 (GAUZE/BANDAGES/DRESSINGS) ×1 IMPLANT
ELECT REM PT RETURN 9FT ADLT (ELECTROSURGICAL) ×1
ELECTRODE REM PT RTRN 9FT ADLT (ELECTROSURGICAL) ×1 IMPLANT
EXTRACTOR VACUUM KIWI (MISCELLANEOUS) IMPLANT
GAUZE SPONGE 4X4 12PLY STRL LF (GAUZE/BANDAGES/DRESSINGS) IMPLANT
GLOVE BIO SURGEON STRL SZ 6.5 (GLOVE) ×1 IMPLANT
GLOVE BIOGEL PI IND STRL 6.5 (GLOVE) ×1 IMPLANT
GLOVE BIOGEL PI IND STRL 7.0 (GLOVE) ×2 IMPLANT
GOWN STRL REUS W/TWL LRG LVL3 (GOWN DISPOSABLE) ×2 IMPLANT
KIT ABG SYR 3ML LUER SLIP (SYRINGE) ×1 IMPLANT
NDL HYPO 25X5/8 SAFETYGLIDE (NEEDLE) ×1 IMPLANT
NEEDLE HYPO 25X5/8 SAFETYGLIDE (NEEDLE) ×1 IMPLANT
NS IRRIG 1000ML POUR BTL (IV SOLUTION) ×1 IMPLANT
PACK C SECTION WH (CUSTOM PROCEDURE TRAY) ×1 IMPLANT
PAD OB MATERNITY 4.3X12.25 (PERSONAL CARE ITEMS) ×1 IMPLANT
SUT PLAIN 0 NONE (SUTURE) IMPLANT
SUT PLAIN 2 0 (SUTURE) ×1
SUT PLAIN ABS 2-0 CT1 27XMFL (SUTURE) ×1 IMPLANT
SUT VIC AB 0 CT1 36 (SUTURE) ×1 IMPLANT
SUT VIC AB 0 CTX 36 (SUTURE) ×2
SUT VIC AB 0 CTX36XBRD ANBCTRL (SUTURE) ×2 IMPLANT
SUT VIC AB 4-0 PS2 27 (SUTURE) ×1 IMPLANT
TOWEL OR 17X24 6PK STRL BLUE (TOWEL DISPOSABLE) ×1 IMPLANT
TRAY FOLEY W/BAG SLVR 14FR LF (SET/KITS/TRAYS/PACK) IMPLANT
WATER STERILE IRR 1000ML POUR (IV SOLUTION) ×1 IMPLANT

## 2022-09-08 NOTE — Lactation Note (Signed)
This note was copied from a baby's chart. Lactation Consultation Note  Patient Name: Cheyenne Lozano [id number] Date: 09/08/2022 Age:30 hours  Reason for consult: Initial assessment;Breast reduction;Term  P2, GA 39.1  Mother states baby has been latching well. This is her first time to breastfeed. She did not breast feed her other child. She states her milk came in with him and this was following her breast reduction.   Pumping was started to stimulate milk supply. Instructed to breastfeed with feeding feeding cues, call for assist with latch as needed, lots of skin to skin and pump following breastfeeding. Mother is aware that post breast reduction, she may not make a full milk supply for her baby. Mother is supplementing with formula.  Mother given handout of O/P services, breastfeeding support groups, and our phone # for post-discharge questions.    Maternal Data Has patient been taught Hand Expression?: Yes Does the patient have breastfeeding experience prior to this delivery?: No  Feeding Mother's Current Feeding Choice: Breast Milk Nipple Type: Extra Slow Flow  LATCH Score Lactation Tools Discussed/Used Tools: Pump;Flanges Flange Size: 21 Breast pump type: Double-Electric Breast Pump Pump Education: Setup, frequency, and cleaning;Milk Storage Reason for Pumping: stimulate milk production, breast reduction Pumping frequency: every 3 hours for 15 minutes   Consult Status Consult Status: Follow-up Date: 09/09/22 Follow-up type: In-patient    Stana Bunting M 09/08/2022, 8:03 PM

## 2022-09-08 NOTE — Op Note (Signed)
PROCEDURE DATE: 09/08/22   PREOPERATIVE DIAGNOSIS: Repeat cesarean section   POSTOPERATIVE DIAGNOSIS: The same   PROCEDURE:    Primary Low Transverse Cesarean Section   SURGEON:  Dr. Lucillie Garfinkel  ASSISTANT: Dr. Clement Husbands  An experienced assistant was required given the standard of surgical care given the complexity of the case.  This assistant was needed for exposure, dissection, suctioning, retraction, instrument exchange, assisting with delivery with administration of fundal pressure, and for overall help during the procedure.    INDICATIONS: This is a 32yo [id number] at 44 wga requiring cesarean section secondary to desires repeat cesarean section.  Decision made to proceed with LTCS. The risks of cesarean section discussed with the patient included but were not limited to: bleeding which may require transfusion or reoperation; infection which may require antibiotics; injury to bowel, bladder, ureters or other surrounding organs; injury to the fetus; need for additional procedures including hysterectomy in the event of a life-threatening hemorrhage; placental abnormalities wth subsequent pregnancies, incisional problems, thromboembolic phenomenon and other postoperative/anesthesia complications. The patient agreed with the proposed plan, giving informed consent for the procedure.     FINDINGS:  Viable female infant in vertex presentation, APGARs pending,  Weight pending, Amniotic fluid clear,  Intact placenta, three vessel cord.  Grossly normal uterus .   ANESTHESIA:    Epidural ESTIMATED BLOOD LOSS: see anethesia record SPECIMENS: Placenta for routine COMPLICATIONS: None immediate   PROCEDURE IN DETAIL:  The patient received intravenous antibiotics (2g Ancef) and had sequential compression devices applied to her lower extremities while in the preoperative area.  She was then taken to the operating room where epidural anesthesia was dosed up to surgical level and was found to be adequate.  She was then placed in a dorsal supine position with a leftward tilt, and prepped and draped in a sterile manner.  A foley catheter was placed into her bladder and attached to constant gravity.  After an adequate timeout was performed, a Pfannenstiel skin incision was made with scalpel and carried through to the underlying layer of fascia. The fascia was incised in the midline and this incision was extended bilaterally using the Mayo scissors. Kocher clamps were applied to the superior aspect of the fascial incision and the underlying rectus muscles were dissected off bluntly. A similar process was carried out on the inferior aspect of the facial incision. The rectus muscles were separated in the midline bluntly and the peritoneum was entered bluntly.  A bladder flap was created sharply and developed bluntly. A transverse hysterotomy was made with a scalpel and extended bilaterally bluntly. The bladder blade was then removed. The infant was successfully delivered, and cord was clamped and cut and infant was handed over to awaiting neonatology team. Uterine massage was then administered and the placenta delivered intact with three-vessel cord. Cord gases were taken. The uterus was cleared of clot and debris.  The hysterotomy was closed with 0 vicryl.  A second imbricating suture of 0-vicryl was used to reinforce the incision and aid in hemostasis.The fascia was closed with 0-Vicryl in a running fashion with good restoration of anatomy.  The subcutaneus tissue was irrigated and hemostatic.  The skin was closed with 4-0 Vicryl in a subcuticular fashion.  All surgical site and was hemostatic at end of procedure) without any further bleeding on exam.     It's a girl - "Cheyenne Lozano"!!  Pt tolerated the procedure well. All sponge/lap/needle counts were correct  X 2. Pt taken to recovery room in stable condition.  Lucillie Garfinkel MD

## 2022-09-08 NOTE — Progress Notes (Signed)
Honeycomb and pressure dressing changed per order due to sanguineous drainage coming out of the right side of the dressing. Honeycomb noted to be fully saturated when honeycomb dressing was removed. Incision clean and edges approximated with no additional drainage noted when the old dressing was removed.  New honeycomb and pressure placed without difficulty.  Will continue to monitor.

## 2022-09-08 NOTE — Transfer of Care (Signed)
Immediate Anesthesia Transfer of Care Note  Patient: Cheyenne Lozano  Procedure(s) Performed: REPEAT CESAREAN SECTION EDC: 09-14-22  ALLERG: NKDA  PREVIOUS X1 (Abdomen)  Patient Location: PACU  Anesthesia Type:Spinal  Level of Consciousness: awake  Airway & Oxygen Therapy: Patient Spontanous Breathing  Post-op Assessment: Report given to RN and Post -op Vital signs reviewed and stable  Post vital signs: Reviewed and stable  Last Vitals:  Vitals Value Taken Time  BP 121/75 09/08/22 0845  Temp    Pulse 82 09/08/22 0846  Resp 18 09/08/22 0846  SpO2 99 % 09/08/22 0846  Vitals shown include unvalidated device data.  Last Pain:  Vitals:   09/08/22 0606  TempSrc: Oral  PainSc: 0-No pain         Complications: No notable events documented.

## 2022-09-08 NOTE — Anesthesia Procedure Notes (Signed)
Spinal  Start time: 09/08/2022 7:34 AM End time: 09/08/2022 7:36 AM Reason for block: surgical anesthesia Staffing Performed: anesthesiologist  Anesthesiologist: Effie Berkshire, MD Performed by: Effie Berkshire, MD Authorized by: Effie Berkshire, MD   Preanesthetic Checklist Completed: patient identified, IV checked, site marked, risks and benefits discussed, surgical consent, monitors and equipment checked, pre-op evaluation and timeout performed Spinal Block Patient position: sitting Prep: DuraPrep and site prepped and draped Location: L3-4 Injection technique: single-shot Needle Needle type: Pencan  Needle gauge: 24 G Needle length: 10 cm Needle insertion depth: 10 cm Additional Notes Patient tolerated well. No immediate complications.  Functioning IV was confirmed and monitors were applied. Sterile prep and drape, including hand hygiene and sterile gloves were used. The patient was positioned and the back was prepped. The skin was anesthetized with lidocaine. Free flow of clear CSF was obtained prior to injecting local anesthetic into the CSF. The spinal needle aspirated freely following injection. The needle was carefully withdrawn. The patient tolerated the procedure well.

## 2022-09-08 NOTE — Progress Notes (Signed)
No updates to above H&P. Patient arrived NPO and was consented in PACU. Risks again discussed, all questions answered, and consent signed. Proceed with above surgery.    Keeana Pieratt MD  

## 2022-09-08 NOTE — Anesthesia Postprocedure Evaluation (Signed)
Anesthesia Post Note  Patient: Cheyenne Lozano  Procedure(s) Performed: REPEAT CESAREAN SECTION EDC: 09-14-22  ALLERG: NKDA  PREVIOUS X1 (Abdomen)     Patient location during evaluation: PACU Anesthesia Type: Spinal Level of consciousness: oriented and awake and alert Pain management: pain level controlled Vital Signs Assessment: post-procedure vital signs reviewed and stable Respiratory status: spontaneous breathing, respiratory function stable and patient connected to nasal cannula oxygen Cardiovascular status: blood pressure returned to baseline and stable Postop Assessment: no headache, no backache, no apparent nausea or vomiting and spinal receding Anesthetic complications: no  No notable events documented.  Last Vitals:  Vitals:   09/08/22 0940 09/08/22 0954  BP:  129/85  Pulse:  75  Resp:  15  Temp: (!) 36.2 C 36.7 C  SpO2:  100%    Last Pain:  Vitals:   09/08/22 0954  TempSrc: Oral  PainSc:    Pain Goal:    LLE Motor Response: Non-purposeful movement (09/08/22 0915) LLE Sensation: Numbness (09/08/22 0915) RLE Motor Response: Non-purposeful movement (09/08/22 0915) RLE Sensation: Numbness (09/08/22 0915) L Sensory Level: T12-Inguinal (groin) region (09/08/22 0915) R Sensory Level: T12-Inguinal (groin) region (09/08/22 0915) Epidural/Spinal Function Cutaneous sensation: Tingles (09/08/22 1001), Patient able to flex knees: Yes (09/08/22 1001), Patient able to lift hips off bed: No (09/08/22 1001), Back pain beyond tenderness at insertion site: No (09/08/22 1001), Progressively worsening motor and/or sensory loss: No (09/08/22 1001), Bowel and/or bladder incontinence post epidural: No (09/08/22 1001)  Effie Berkshire

## 2022-09-08 NOTE — Progress Notes (Signed)
Pt's honeycomb dressing noted to have minimal to moderate amount of bleeding, called Dr. Royston Sinner to request a pressure dressing. Okay per MD to place pressure dressing. Dressing placed by Vennie Homans, RN. Will reassess and update MD if needed.

## 2022-09-09 LAB — CBC
HCT: 25.6 % — ABNORMAL LOW (ref 36.0–46.0)
Hemoglobin: 7.9 g/dL — ABNORMAL LOW (ref 12.0–15.0)
MCH: 26.5 pg (ref 26.0–34.0)
MCHC: 30.9 g/dL (ref 30.0–36.0)
MCV: 85.9 fL (ref 80.0–100.0)
Platelets: 224 10*3/uL (ref 150–400)
RBC: 2.98 MIL/uL — ABNORMAL LOW (ref 3.87–5.11)
RDW: 14.4 % (ref 11.5–15.5)
WBC: 12.3 10*3/uL — ABNORMAL HIGH (ref 4.0–10.5)
nRBC: 0 % (ref 0.0–0.2)

## 2022-09-09 LAB — BIRTH TISSUE RECOVERY COLLECTION (PLACENTA DONATION)
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
## 2022-09-09 NOTE — Lactation Note (Signed)
This note was copied from a baby's chart. Lactation Consultation Note  Patient Name: Cheyenne Lozano [id number] Date: 09/09/2022 Age:30 hours Reason for consult: Follow-up assessment;Term;1st time breastfeeding Mom showed [place] her Lt. Nipple the top of it is bruised. Mom stated it is sore. Hurts when she first latches then it is OK. Mom is using coconut oil.  Gave mom shells for soreness and to evert nipples out more. Once baby gets latched well and after about the 4-5th suck it is much better. Encouraged to flange lips. Baby has a thick labial frenulum. No swallows heard at this time, baby was feeding well then became fussy. Mom removed baby to burp. Mom didn't BF her 1st child now 76 yrs old. Mom has DEBP set up in room and has used it but didn't get anything out. Explained that is normal. Mom stated that the baby is cluster feeding and it is hard to pump w/baby cluster feeding. Discussed importance of pumping some d/t mom had breast reduction and baby has dropped less than 6 lbs. Mom is supplementing w/formula after BF.  Mom is putting baby to the breast first but not much breast feeding documentation is being done. Encouraged to do football hold at this time since BF is painful  and nipples bruised. Breast tissue is compressible at this time. If nipples continue to get damaged mom may need a NS.   Maternal Data Has patient been taught Hand Expression?: Yes Does the patient have breastfeeding experience prior to this delivery?: No  Feeding Nipple Type: Extra Slow Flow  LATCH Score Latch: Grasps breast easily, tongue down, lips flanged, rhythmical sucking.  Audible Swallowing: None  Type of Nipple: Everted at rest and after stimulation (very short shaft)  Comfort (Breast/Nipple): Filling, red/small blisters or bruises, mild/mod discomfort (bruised Lt. top nipple)  Hold (Positioning): Assistance needed to correctly position infant at breast and maintain latch.  LATCH Score:  6   Lactation Tools Discussed/Used Tools: Pump;Shells;Coconut oil Flange Size: 21  Interventions Interventions: Breast feeding basics reviewed;Adjust position;Assisted with latch;Support pillows;Skin to skin;Position options;Breast massage;Hand express;Shells;Breast compression  Discharge    Consult Status Consult Status: Follow-up Date: 09/10/22 Follow-up type: In-patient    Theodoro Kalata 09/09/2022, 11:31 PM

## 2022-09-09 NOTE — Progress Notes (Signed)
Subjective: Postpartum Day 1: Cesarean Delivery scheduled repeat Patient reports tolerating PO and no problems voiding.   Denies dizziness, etc. Lochia is minimal.   Objective: Vital signs in last 24 hours: Temp:  [98.3 F (36.8 C)-99.4 F (37.4 C)] 99.2 F (37.3 C) (03/22 0356) Pulse Rate:  [59-81] 59 (03/22 0356) Resp:  [16-18] 16 (03/22 0356) BP: (114-125)/(70-87) 114/79 (03/22 0356) SpO2:  [100 %] 100 % (03/21 1730)  Physical Exam:  General: alert and cooperative Lochia: appropriate Uterine Fundus: firm Incision: healing well, no significant drainage, no dehiscence, no significant erythema, some strikethrough on honeycomb - no active bleeding after pressure dressing removed DVT Evaluation: No evidence of DVT seen on physical exam. Negative Homan's sign. No cords or calf tenderness.  Recent Labs    09/09/22 0435  HGB 7.9*  HCT 25.6*    Assessment/Plan: Status post Cesarean section. Doing well postoperatively.  Continue current care. No s/s anemia - hgb 7.9. Declines intervention.  Continue PNV with Fe.    Tyson Dense, MD 09/09/2022, 10:09 AM

## 2022-09-10 MED ORDER — OXYCODONE HCL 5 MG PO TABS: 5.0000 mg | ORAL_TABLET | ORAL | 0 refills | Status: DC | PRN

## 2022-09-10 MED ORDER — DOCUSATE SODIUM 100 MG PO CAPS: 100.0000 mg | ORAL_CAPSULE | Freq: Two times a day (BID) | ORAL | 2 refills | Status: DC

## 2022-09-10 MED ORDER — IBUPROFEN 600 MG PO TABS: 600.0000 mg | ORAL_TABLET | Freq: Four times a day (QID) | ORAL | 0 refills | Status: DC | PRN

## 2022-09-10 MED ORDER — FERROUS SULFATE 325 (65 FE) MG PO TBEC: 325.0000 mg | DELAYED_RELEASE_TABLET | Freq: Two times a day (BID) | ORAL | 2 refills | Status: DC

## 2022-09-10 NOTE — Progress Notes (Signed)
Subjective: Postpartum Day 2: Cesarean Delivery scheduled repeat Patient reports tolerating PO and no problems voiding.   Denies dizziness, etc. Lochia is minimal. She missed some doses of pain meds last night. Had ibuprofen and tylenol this am but instead of oxy (last took almost 10 hours ago) requested IV dilaudid bc "lasts longer for her". She had some incisional pain just in one area early this am that is improving.    Objective: Vital signs in last 24 hours: Temp:  [98.2 F (36.8 C)-98.3 F (36.8 C)] 98.2 F (36.8 C) (03/23 0543) Pulse Rate:  [57-63] 57 (03/23 0703) Resp:  [16-17] 16 (03/23 0543) BP: (119-130)/(77-89) 125/88 (03/23 0703) SpO2:  [100 %] 100 % (03/23 0703)  Physical Exam:  General: alert and cooperative, well appearing Lochia: appropriate Uterine Fundus: firm, appropriately tender Incision: healing well, no significant drainage, no dehiscence, no significant erythema, NO strike-through, appropriately tender, no distention.  DVT Evaluation: No evidence of DVT seen on physical exam. Negative Homan's sign. No cords or calf tenderness.  Recent Labs    09/09/22 0435  HGB 7.9*  HCT 25.6*     Assessment/Plan: Status post Cesarean section. Doing well postoperatively.  Continue current care. D/w pt continue ibuprofen/tylenol. Dilaudid IV instead of oxy was per pt request - she is tolerating PO, her exam is benign without any evidence of continued bleeding or any abnormal distention. Would like to go home today.I encouraged her to remain on PO meds and she is in agreement with this plan.   No s/s anemia - hgb 7.9. Declines intervention.  Continue PNV with Fe.  As long as meeting goals, will d/c later today. D/w pt and RN plan.    Tyson Dense, MD 09/10/2022, 8:47 AM

## 2022-09-10 NOTE — Discharge Summary (Signed)
Postpartum Discharge Summary     Patient Name: Cheyenne Lozano DOB: Nov 06, 1990 MRN: [id number]  Date of admission: 09/08/2022 Delivery date:09/08/2022  Delivering provider: Tyson Dense  Date of discharge: 09/10/2022  Admitting diagnosis: Pregnancy [Z34.90] Intrauterine pregnancy: [redacted]w[redacted]d     Secondary diagnosis:  Principal Problem:   Pregnancy  Additional problems: None    Discharge diagnosis: Term Pregnancy Delivered                                              Post partum procedures: None Augmentation: N/A Complications: None  Hospital course: Sceduled C/S   32 y.o. yo [id number] at [redacted]w[redacted]d was admitted to the hospital 09/08/2022 for scheduled cesarean section with the following indication:Elective Repeat.Delivery details are as follows:  Membrane Rupture Time/Date: 8:02 AM ,09/08/2022   Delivery Method:C-Section, Low Transverse  Details of operation can be found in separate operative note.  Patient had a postpartum course complicated by None.  She is ambulating, tolerating a regular diet, passing flatus, and urinating well. Patient is discharged home in stable condition on  09/10/22        Newborn Data: Birth date:09/08/2022  Birth time:8:02 AM  Gender:Female  Living status:Living  Apgars:8 ,9  Weight:2850 g     Magnesium Sulfate received: No BMZ received: No Rhophylac:N/A MMR:N/A T-DaP:Given prenatally Flu: N/A Transfusion:No  Physical exam  Vitals:   09/09/22 0356 09/09/22 2228 09/10/22 0543 09/10/22 0703  BP: 114/79 119/77 130/89 125/88  Pulse: (!) 59 (!) 58 63 (!) 57  Resp: 16 17 16    Temp: 99.2 F (37.3 C) 98.3 F (36.8 C) 98.2 F (36.8 C)   TempSrc: Oral Oral Oral   SpO2:  100% 100% 100%  Weight:      Height:       General: alert Lochia: appropriate Uterine Fundus: firm Incision: Healing well with no significant drainage DVT Evaluation: No evidence of DVT seen on physical exam. Labs: Lab Results  Component Value Date   WBC 12.3 (H)  09/09/2022   HGB 7.9 (L) 09/09/2022   HCT 25.6 (L) 09/09/2022   MCV 85.9 09/09/2022   PLT 224 09/09/2022      Latest Ref Rng & Units 03/03/2022    1:11 PM  CMP  Glucose 70 - 99 mg/dL 98   BUN 6 - 20 mg/dL 8   Creatinine 0.44 - 1.00 mg/dL 0.68   Sodium 135 - 145 mmol/L 139   Potassium 3.5 - 5.1 mmol/L 4.3   Chloride 98 - 111 mmol/L 107   CO2 22 - 32 mmol/L 22   Calcium 8.9 - 10.3 mg/dL 9.6   Total Protein 6.5 - 8.1 g/dL 7.3   Total Bilirubin 0.3 - 1.2 mg/dL 0.5   Alkaline Phos 38 - 126 U/L 49   AST 15 - 41 U/L 18   ALT 0 - 44 U/L 19    Edinburgh Score:    09/08/2022   10:01 AM  Edinburgh Postnatal Depression Scale Screening Tool  I have been able to laugh and see the funny side of things. 0  I have looked forward with enjoyment to things. 0  I have blamed myself unnecessarily when things went wrong. 0  I have been anxious or worried for no good reason. 0  I have felt scared or panicky for no good reason. 0  Things have been getting  on top of me. 0  I have been so unhappy that I have had difficulty sleeping. 0  I have felt sad or miserable. 0  I have been so unhappy that I have been crying. 0  The thought of harming myself has occurred to me. 0  Edinburgh Postnatal Depression Scale Total 0      After visit meds:  Allergies as of 09/10/2022   No Known Allergies      Medication List     TAKE these medications    docusate sodium 100 MG capsule Commonly known as: Colace Take 1 capsule (100 mg total) by mouth 2 (two) times daily.   ferrous sulfate 325 (65 FE) MG EC tablet Take 1 tablet (325 mg total) by mouth 2 (two) times daily.   ibuprofen 600 MG tablet Commonly known as: ADVIL Take 1 tablet (600 mg total) by mouth every 6 (six) hours as needed.   oxyCODONE 5 MG immediate release tablet Commonly known as: Oxy IR/ROXICODONE Take 1 tablet (5 mg total) by mouth every 4 (four) hours as needed for severe pain.   prenatal multivitamin Tabs tablet Take 1 tablet  by mouth daily.         Discharge home in stable condition Infant Feeding: Bottle and Breast Infant Disposition:home with mother Discharge instruction: per After Visit Summary and Postpartum booklet. Activity: Advance as tolerated. Pelvic rest for 6 weeks.  Diet: routine diet Anticipated Birth Control: Unsure Postpartum Appointment:6 weeks Additional Postpartum F/U:  None Future Appointments:No future appointments. Follow up Visit:      09/10/2022 Tyson Dense, MD

## 2022-09-10 NOTE — Lactation Note (Signed)
This note was copied from a baby's chart. Lactation Consultation Note  Patient Name: Cheyenne Lozano [id number] Date: 09/10/2022 Age:30 hours Reason for consult: Follow-up assessment;Term  LC into visit prior to discharge. Lactating parent is currently pumping, so far collected ~49mL. Praised lactating parent. Provided a pumping top. Talked more about sore nipples and encouraged nipple care with expressed colostrum, coconut oil or comfort gels (proper use explained).   Plan: 1-Feeding on demand 8-12 times in 24h period. 2-Supplement as needed following guidelines, pacing, upright position and burping frequently.  3-Pump as needed and transition to maintenance pumping when collecting 80mL or more 3 consecutive times.  4-Encouraged parental rest, hydration and food intake.   Contact LC as needed for feeds/support/concerns/questions. All questions answered at this time. Provided information about resources after discharge.    Feeding Mother's Current Feeding Choice: Breast Milk and Formula  LATCH Score No latch observed during this encounter.    Lactation Tools Discussed/Used Tools: Pump;Hands-free pumping top Breast pump type: Double-Electric Breast Pump;Manual Pump Education: Setup, frequency, and cleaning;Milk Storage Reason for Pumping: stimulation and supplementation Pumping frequency: as needed Pumped volume: 10 mL (still pumping)  Interventions Interventions: DEBP;Hand pump;Expressed milk;Coconut oil;Education;Comfort gels  Discharge Discharge Education: Engorgement and breast care;Warning signs for feeding baby Pump: DEBP;Manual;Personal  Consult Status Consult Status: Complete Date: 09/10/22 Follow-up type: Call as needed    [place] 09/10/2022, 1:27 PM

## 2022-09-10 NOTE — Progress Notes (Signed)
I changed MOB's honeycomb dressing around 0000. It was previously saturated with 75% sanguinous drainage.

## 2022-09-14 ENCOUNTER — Ambulatory Visit (HOSPITAL_COMMUNITY): Admission: EM | Admit: 2022-09-14 | Discharge: 2022-09-14 | Discharge status: Against Medical Advice | Payer: 59

## 2022-09-15 ENCOUNTER — Inpatient Hospital Stay (HOSPITAL_COMMUNITY)
Admission: AD | Admit: 2022-09-15 | Discharge: 2022-09-15 | Disposition: A | Discharge status: Home / Self Care | Payer: 59 | Attending: Obstetrics and Gynecology | Admitting: Obstetrics and Gynecology

## 2022-09-15 DIAGNOSIS — Z4889 Encounter for other specified surgical aftercare: Secondary | ICD-10-CM | POA: Diagnosis not present

## 2022-09-15 DIAGNOSIS — O9089 Other complications of the puerperium, not elsewhere classified: Secondary | ICD-10-CM | POA: Insufficient documentation

## 2022-09-15 LAB — CBC
HCT: 36.5 % (ref 36.0–46.0)
Hemoglobin: 10.9 g/dL — ABNORMAL LOW (ref 12.0–15.0)
MCH: 25.8 pg — ABNORMAL LOW (ref 26.0–34.0)
MCHC: 29.9 g/dL — ABNORMAL LOW (ref 30.0–36.0)
MCV: 86.5 fL (ref 80.0–100.0)
Platelets: 345 10*3/uL (ref 150–400)
RBC: 4.22 MIL/uL (ref 3.87–5.11)
RDW: 14.5 % (ref 11.5–15.5)
WBC: 5.6 10*3/uL (ref 4.0–10.5)
nRBC: 0 % (ref 0.0–0.2)

## 2022-09-15 LAB — COMPREHENSIVE METABOLIC PANEL
ALT: 26 U/L (ref 0–44)
AST: 27 U/L (ref 15–41)
Albumin: 3 g/dL — ABNORMAL LOW (ref 3.5–5.0)
Alkaline Phosphatase: 129 U/L — ABNORMAL HIGH (ref 38–126)
Anion gap: 13 (ref 5–15)
BUN: 14 mg/dL (ref 6–20)
CO2: 21 mmol/L — ABNORMAL LOW (ref 22–32)
Calcium: 8.6 mg/dL — ABNORMAL LOW (ref 8.9–10.3)
Chloride: 108 mmol/L (ref 98–111)
Creatinine, Ser: 0.92 mg/dL (ref 0.44–1.00)
GFR, Estimated: >60 mL/min (ref >60)
Glucose, Bld: 84 mg/dL (ref 70–99)
Potassium: 3.9 mmol/L (ref 3.5–5.1)
Sodium: 142 mmol/L (ref 135–145)
Total Bilirubin: 0.5 mg/dL (ref 0.3–1.2)
Total Protein: 6.4 g/dL — ABNORMAL LOW (ref 6.5–8.1)

## 2022-09-15 NOTE — MAU Note (Addendum)
Cheyenne Lozano is a 32 y.o. at Unknown here in MAU reporting: repeat c/s on 3/21.  Was having a lot of incisional pain. Noted blood in the middle of incision.  Saw Dr Corinna Capra, was told superficial hematoma, he drained it, probed it and put pressure on it. Continued to bleed. Went back to dr again.  BP was elevated.  With the on going bleeding- he felt it needed further eval.   HGB was 7.9 on 3/22, MD thought it should be rechecked with the bleeding.  Also has a hx of pre- E.  Denies HA, visual changes or epigastric pain, some increase in swelling.(No pitting) Has been severely constipated. Onset of complaint: yesterday Pain score: incisional pain 5 Vitals:   09/15/22 1635  BP: 122/66  Pulse: 61  Resp: 16  Temp: 97.8 F (36.6 C)  SpO2: 99%      ABD placed in office.  Scant serous fluid noted now.  Pt states, changed about 4 hrs ago, had been "straight up blood" before that.  Skin above incision looks/feels slightly edematous.  Pt has mostly been taking Tylenol for pain.  Has Ibuprofen and Oxy, not taking them like she should, "makes her so sleepy.".

## 2022-09-15 NOTE — MAU Note (Cosign Needed)
Event Date/Time   First Provider Initiated Contact with Patient 09/15/22 1651      Chief Complaint:  Post-op Problem and Hypertension   Cheyenne Lozano is  32 y.o. [id number].  No LMP recorded.. She presents being sent from the office for evaluation of: Post-op Problem and Hypertension .  Had RCS on 3/21.  Incision started bleeding yesterday, Dr. Corinna Capra opened/probed and put a pressure dressing on it, presumed superficial hematoma.  However, it continued to bleed, went back to MD today, saw Dr. Mardelle Matte, who sent her to MAU.  States that over the past several hours, fluid has been less bloody.    Past Medical History:  Diagnosis Date   Bipolar disorder ([place]) 05/09/2019   Breast hypertrophy in female 02/26/2015   Generalized anxiety disorder 05/09/2019   History of gestational diabetes mellitus 03/03/2022   History of pre-eclampsia 03/03/2022   asa 12 wga   Type II diabetes mellitus ([place])    "actually I'm prediabetic; dr. put me on RX to get A1C #'s down" (02/26/2015)    Past Surgical History:  Procedure Laterality Date   BREAST REDUCTION SURGERY Bilateral 02/26/2015   Procedure: BILATERAL BREAST REDUCTION  ;  Surgeon: Crissie Reese, MD;  Location: [facility];  Service: Plastics;  Laterality: Bilateral;   CESAREAN SECTION N/A 06/28/2017   Procedure: CESAREAN SECTION;  Surgeon: Gwynne Edinger, MD;  Location: [facility];  Service: Obstetrics;  Laterality: N/A;   CESAREAN SECTION N/A 09/08/2022   Procedure: REPEAT CESAREAN SECTION EDC: 09-14-22  ALLERG: NKDA  PREVIOUS X1;  Surgeon: Tyson Dense, MD;  Location: [place] [facility];  Service: Obstetrics;  Laterality: N/A;   REDUCTION MAMMAPLASTY Bilateral 02/26/2015   WISDOM TOOTH EXTRACTION  ~ 2010   "bottom 2"    Family History  Problem Relation Age of Onset   Diabetes Father    Hypertension Father    Stroke Father    Schizophrenia Father    Diabetes Maternal Grandmother    Hypertension Maternal Grandmother    Diabetes Maternal  Grandfather    Hypertension Maternal Grandfather    Heart attack Maternal Grandfather    Gout Maternal Grandfather    Hypertension Paternal Grandmother    Schizophrenia Other    Schizophrenia Cousin     Social History   Tobacco Use   Smoking status: Never   Smokeless tobacco: Never  Vaping Use   Vaping Use: Never used  Substance Use Topics   Alcohol use: Not Currently    Comment: social   Drug use: No    Allergies: No Known Allergies  Medications Prior to Admission  Medication Sig Dispense Refill Last Dose   docusate sodium (COLACE) 100 MG capsule Take 1 capsule (100 mg total) by mouth 2 (two) times daily. 60 capsule 2    ferrous sulfate 325 (65 FE) MG EC tablet Take 1 tablet (325 mg total) by mouth 2 (two) times daily. 60 tablet 2    ibuprofen (ADVIL) 600 MG tablet Take 1 tablet (600 mg total) by mouth every 6 (six) hours as needed. 30 tablet 0    oxyCODONE (OXY IR/ROXICODONE) 5 MG immediate release tablet Take 1 tablet (5 mg total) by mouth every 4 (four) hours as needed for severe pain. 15 tablet 0    Prenatal Vit-Fe Fumarate-FA (PRENATAL MULTIVITAMIN) TABS tablet Take 1 tablet by mouth daily.        Review of Systems   Constitutional: Negative for fever and chills Eyes: Negative for visual disturbances Respiratory: Negative  for shortness of breath, dyspnea Cardiovascular: Negative for chest pain or palpitations  Gastrointestinal: Negative for vomiting, diarrhea  Genitourinary: Negative for dysuria and urgency Musculoskeletal: Negative for back pain, joint pain, myalgias  Neurological: Negative for dizziness and headaches     Physical Exam   Blood pressure 128/74, pulse 63, temperature 97.8 F (36.6 C), temperature source Oral, resp. rate 16, height 4\' 11"  (1.499 m), weight 73.1 kg, SpO2 98 %, currently breastfeeding.  General: General appearance - alert, well appearing, and in no distress and oriented to person, place, and time Chest - normal respiratory  effort Heart - normal rate and regular rhythm Abdomen - small to moderate amount of serosanguinous fluid, no odor.  No erythema or edema, feels soft Pelvic - deferred Extremities - no pedal edema noted   Labs: Results for orders placed or performed during the hospital encounter of 09/15/22 (from the past 24 hour(s))  CBC   Collection Time: 09/15/22  5:06 PM  Result Value Ref Range   WBC 5.6 4.0 - 10.5 K/uL   RBC 4.22 3.87 - 5.11 MIL/uL   Hemoglobin 10.9 (L) 12.0 - 15.0 g/dL   HCT 36.5 36.0 - 46.0 %   MCV 86.5 80.0 - 100.0 fL   MCH 25.8 (L) 26.0 - 34.0 pg   MCHC 29.9 (L) 30.0 - 36.0 g/dL   RDW 14.5 11.5 - 15.5 %   Platelets 345 150 - 400 K/uL   nRBC 0.0 0.0 - 0.2 %  Comprehensive metabolic panel   Collection Time: 09/15/22  5:06 PM  Result Value Ref Range   Sodium 142 135 - 145 mmol/L   Potassium 3.9 3.5 - 5.1 mmol/L   Chloride 108 98 - 111 mmol/L   CO2 21 (L) 22 - 32 mmol/L   Glucose, Bld 84 70 - 99 mg/dL   BUN 14 6 - 20 mg/dL   Creatinine, Ser 0.92 0.44 - 1.00 mg/dL   Calcium 8.6 (L) 8.9 - 10.3 mg/dL   Total Protein 6.4 (L) 6.5 - 8.1 g/dL   Albumin 3.0 (L) 3.5 - 5.0 g/dL   AST 27 15 - 41 U/L   ALT 26 0 - 44 U/L   Alkaline Phosphatase 129 (H) 38 - 126 U/L   Total Bilirubin 0.5 0.3 - 1.2 mg/dL   GFR, Estimated >60 >60 mL/min   Anion gap 13 5 - 15   Imaging Studies:  No results found.   Assessment: Patient Active Problem List   Diagnosis Date Noted   Pregnancy 09/08/2022   Mycoplasma infection 03/03/2022   History of pre-eclampsia 03/03/2022   History of gestational diabetes mellitus 03/03/2022   History of cesarean section 03/03/2022   Anemia 03/03/2022   Panic disorder 08/07/2019   Generalized anxiety disorder 05/09/2019   Bipolar disorder ([place]) 05/09/2019   Insomnia 04/26/2019   Hyperglycemia 04/07/2019   Upper back pain, chronic 04/07/2019   Hallucination, visual 04/07/2019   Myalgia 04/07/2019   Polycystic ovary syndrome 09/12/2018    Plan:  Hgb  stable, low suspicion for bleeding hematoma WBC normal, no evidence of infection  Keep incision as clean and dry as possible  Discussed w/Dr. Mardelle Matte, pt to keep her appointment on Monday at 2:30  Infection/bleeding precautions discussed.  Christin Fudge

## 2022-09-21 ENCOUNTER — Telehealth (HOSPITAL_COMMUNITY): Payer: Self-pay | Admitting: *Deleted

## 2022-09-21 NOTE — Telephone Encounter (Signed)
Mom reports feeling good. Incision is doing better now after an episode of incision opening on one side last week. No concerns regarding herself at this time. EPDS= 1(hospital score=0) Mom reports baby is well. Feeding, peeing, and pooping without difficulty. Reviewed safe sleep. Mom has no concerns about baby at present.  Odis Hollingshead, RN 09-21-2022 at 4:13pm

## 2022-09-22 ENCOUNTER — Telehealth (HOSPITAL_COMMUNITY): Payer: Self-pay

## 2022-09-22 NOTE — Telephone Encounter (Signed)
Preadmission testing 

## 2022-10-17 DIAGNOSIS — D649 Anemia, unspecified: Secondary | ICD-10-CM | POA: Diagnosis not present

## 2022-10-17 DIAGNOSIS — Z309 Encounter for contraceptive management, unspecified: Secondary | ICD-10-CM | POA: Diagnosis not present

## 2022-10-17 DIAGNOSIS — Z9889 Other specified postprocedural states: Secondary | ICD-10-CM | POA: Diagnosis not present

## 2022-10-17 DIAGNOSIS — Z1389 Encounter for screening for other disorder: Secondary | ICD-10-CM | POA: Diagnosis not present

## 2023-01-25 DIAGNOSIS — H5201 Hypermetropia, right eye: Secondary | ICD-10-CM | POA: Diagnosis not present

## 2023-02-07 ENCOUNTER — Encounter (HOSPITAL_COMMUNITY): Payer: Self-pay | Admitting: Obstetrics and Gynecology

## 2023-02-07 ENCOUNTER — Inpatient Hospital Stay (HOSPITAL_COMMUNITY)
Admission: AD | Admit: 2023-02-07 | Discharge: 2023-02-07 | Disposition: A | Discharge status: Home / Self Care | Payer: 59 | Attending: Obstetrics and Gynecology | Admitting: Obstetrics and Gynecology

## 2023-02-07 DIAGNOSIS — E282 Polycystic ovarian syndrome: Secondary | ICD-10-CM | POA: Insufficient documentation

## 2023-02-07 DIAGNOSIS — Z789 Other specified health status: Secondary | ICD-10-CM

## 2023-02-07 DIAGNOSIS — N644 Mastodynia: Secondary | ICD-10-CM | POA: Diagnosis not present

## 2023-02-07 DIAGNOSIS — R140 Abdominal distension (gaseous): Secondary | ICD-10-CM | POA: Insufficient documentation

## 2023-02-07 DIAGNOSIS — Z3202 Encounter for pregnancy test, result negative: Secondary | ICD-10-CM | POA: Insufficient documentation

## 2023-02-07 DIAGNOSIS — R110 Nausea: Secondary | ICD-10-CM

## 2023-02-07 DIAGNOSIS — Z98891 History of uterine scar from previous surgery: Secondary | ICD-10-CM | POA: Diagnosis not present

## 2023-02-07 DIAGNOSIS — R109 Unspecified abdominal pain: Secondary | ICD-10-CM | POA: Diagnosis not present

## 2023-02-07 HISTORY — DX: Personal history of other diseases of the female genital tract: Z87.42

## 2023-02-07 LAB — URINALYSIS, ROUTINE W REFLEX MICROSCOPIC
Bilirubin Urine: NEGATIVE
Glucose, UA: NEGATIVE mg/dL
Hgb urine dipstick: NEGATIVE
Ketones, ur: NEGATIVE mg/dL
Leukocytes,Ua: NEGATIVE
Nitrite: NEGATIVE
Protein, ur: NEGATIVE mg/dL
Specific Gravity, Urine: 1.017 (ref 1.005–1.030)
pH: 5 (ref 5.0–8.0)

## 2023-02-07 LAB — POCT PREGNANCY, URINE: Preg Test, Ur: NEGATIVE

## 2023-02-07 LAB — HCG, QUANTITATIVE, PREGNANCY: hCG, Beta Chain, Quant, S: 1 m[IU]/mL (ref <5)

## 2023-02-07 NOTE — MAU Note (Signed)
Cheyenne Lozano is a 32 y.o. at Unknown here in MAU reporting: period was due , started bleeding 8/7, bled for 7 to 8 days, usually only bleed 2-3 days.  Was much more painful than usual.  Around the 8th day, she started getting nauseated and having tender breasts.  So took a test and it was positive.   Friday the bleeding stopped, was still having pain, took another test and it was neg.  Started feeling nauseated on Monday, retested and had 2 faint positive tests. Took some Tylenol for the cramping LMP: 7/8 Onset of complaint: 8/7 Pain score: 2-3 Vitals:   02/07/23 1602  BP: 117/69  Pulse: 81  Resp: 16  Temp: 98.4 F (36.9 C)  SpO2: 99%      Lab orders placed from triage:  UPT

## 2023-02-07 NOTE — MAU Provider Note (Signed)
History     CSN: [id number]  Arrival date and time: 02/07/23 1532   Event Date/Time   First Provider Initiated Contact with Patient 02/07/23 1704      Chief Complaint  Patient presents with   Possible Pregnancy   Abdominal Pain   Nausea   Possible Pregnancy Associated symptoms include abdominal pain. Pertinent negatives include no chest pain, chills, congestion, coughing, fever, nausea, rash, sore throat or vomiting.  Abdominal Pain Pertinent negatives include no diarrhea, dysuria, fever, nausea or vomiting.  Patient is 32 y.o. [id number]  here with complaints of [place]ub[place] pregnancy.  Patient reports that she was in her normal state of health and started having bleeding but she bled for longer than expected about 7 days.  She reports that she took a pregnancy test that was positive at that time.  Then she reports that she stopped bleeding and then 2 days later resumed bleeding at that time she took a another home pregnancy test that was negative.  She then reports she started having breast pain and some nausea and bloating.  And she took another home pregnancy test that was faintly positive which prompted her to come here to the MAU.  she reported some abdominal cramping.   Patient is currently sexually active and not using any form of contraceptive therapy.  She reports that she does not desire want a pregnancy in the next year.  She has been on hormonal contraception in the past but has not done well with it.  She last delivered a baby 5 months ago.   denies LOF, VB, contractions, vaginal discharge.   OB History     Gravida  4   Para  2   Term  2   Preterm      AB  2   Living  2      SAB  1   IAB  1   Ectopic      Multiple  0   Live Births  2           Past Medical History:  Diagnosis Date   Bipolar disorder (HCC) 05/09/2019   Breast hypertrophy in female 02/26/2015   Generalized anxiety disorder 05/09/2019   History of gestational diabetes mellitus  03/03/2022   History of PCOS    History of pre-eclampsia 03/03/2022   asa 12 wga   Type II diabetes mellitus (HCC)    "actually I'm prediabetic; dr. put me on RX to get A1C #'s down" (02/26/2015)    Past Surgical History:  Procedure Laterality Date   BREAST REDUCTION SURGERY Bilateral 02/26/2015   Procedure: BILATERAL BREAST REDUCTION  ;  Surgeon: Etter Sjogren, MD;  Location: [facility] OR;  Service: Plastics;  Laterality: Bilateral;   CESAREAN SECTION N/A 06/28/2017   Procedure: CESAREAN SECTION;  Surgeon: Kathrynn Running, MD;  Location: [facility] BIRTHING SUITES;  Service: Obstetrics;  Laterality: N/A;   CESAREAN SECTION N/A 09/08/2022   Procedure: REPEAT CESAREAN SECTION EDC: 09-14-22  ALLERG: NKDA  PREVIOUS X1;  Surgeon: Ranae Pila, MD;  Location: [facility] LD ORS;  Service: Obstetrics;  Laterality: N/A;   REDUCTION MAMMAPLASTY Bilateral 02/26/2015   WISDOM TOOTH EXTRACTION  ~ 2010   "bottom 2"    Family History  Problem Relation Age of Onset   Healthy Mother    Hypertension Father    Stroke Father    Schizophrenia Father    Diabetes Maternal Grandmother    Hypertension Maternal Grandmother    Diabetes Maternal Grandfather  Hypertension Maternal Grandfather    Heart attack Maternal Grandfather    Gout Maternal Grandfather    Hypertension Paternal Grandmother    Schizophrenia Cousin    Schizophrenia Other     Social History   Tobacco Use   Smoking status: Never   Smokeless tobacco: Never  Vaping Use   Vaping status: Never Used  Substance Use Topics   Alcohol use: Not Currently    Comment: social   Drug use: No    Allergies: No Known Allergies  Medications Prior to Admission  Medication Sig Dispense Refill Last Dose   acetaminophen (TYLENOL) 500 MG tablet Take 500 mg by mouth every 6 (six) hours as needed.   02/07/2023   ferrous sulfate 325 (65 FE) MG EC tablet Take 1 tablet (325 mg total) by mouth 2 (two) times daily. 60 tablet 2 02/07/2023   Multiple Vitamin (MULTIVITAMIN)  tablet Take 1 tablet by mouth daily.   02/07/2023   docusate sodium (COLACE) 100 MG capsule Take 1 capsule (100 mg total) by mouth 2 (two) times daily. 60 capsule 2    ibuprofen (ADVIL) 600 MG tablet Take 1 tablet (600 mg total) by mouth every 6 (six) hours as needed. 30 tablet 0 More than a month   oxyCODONE (OXY IR/ROXICODONE) 5 MG immediate release tablet Take 1 tablet (5 mg total) by mouth every 4 (four) hours as needed for severe pain. 15 tablet 0 More than a month   Prenatal Vit-Fe Fumarate-FA (PRENATAL MULTIVITAMIN) TABS tablet Take 1 tablet by mouth daily.   More than a month    Review of Systems  Constitutional:  Negative for chills and fever.  HENT:  Negative for congestion and sore throat.   Eyes:  Negative for pain and visual disturbance.  Respiratory:  Negative for cough, chest tightness and shortness of breath.   Cardiovascular:  Negative for chest pain.  Gastrointestinal:  Positive for abdominal pain. Negative for diarrhea, nausea and vomiting.  Endocrine: Negative for cold intolerance and heat intolerance.  Genitourinary:  Negative for dysuria and flank pain.  Musculoskeletal:  Negative for back pain.  Skin:  Negative for rash.  Allergic/Immunologic: Negative for food allergies.  Neurological:  Negative for dizziness and light-headedness.  Psychiatric/Behavioral:  Negative for agitation.    Physical Exam   Blood pressure 117/69, pulse 81, temperature 98.4 F (36.9 C), temperature source Oral, resp. rate 16, height 4\' 11"  (1.499 m), weight 78 kg, last menstrual period 12/26/2022, SpO2 99%, currently breastfeeding.  Physical Exam Vitals and nursing note reviewed.  Constitutional:      Appearance: Normal appearance.  HENT:     Head: Normocephalic and atraumatic.     Nose: Nose normal.     Mouth/Throat:     Mouth: Mucous membranes are moist.  Eyes:     Conjunctiva/sclera: Conjunctivae normal.  Cardiovascular:     Rate and Rhythm: Normal rate.  Pulmonary:      Effort: Pulmonary effort is normal.  Abdominal:     General: Abdomen is flat.     Palpations: Abdomen is soft.  Musculoskeletal:     Cervical back: Normal range of motion.  Skin:    General: Skin is warm.     Capillary Refill: Capillary refill takes less than 2 seconds.  Neurological:     General: No focal deficit present.     Mental Status: She is alert.  Psychiatric:        Mood and Affect: Mood normal.     MAU Course  Procedures  MDM- moderate  Differential for possible pregnancy includes early pregnancy, ectopic pregnancy, chemical pregnancy.  Given patient's episode of bleeding with intermittent positive and negative home pregnancy tests with a negative pregnancy test here in the MAU we decided to follow-up with a serum beta-hCG.  Beta-hCG here in the MAU was <1  Results for orders placed or performed during the hospital encounter of 02/07/23 (from the past 24 hour(s))  Pregnancy, urine POC     Status: None   Collection Time: 02/07/23  4:19 PM  Result Value Ref Range   Preg Test, Ur NEGATIVE NEGATIVE  Urinalysis, Routine w reflex microscopic -Urine, Clean Catch     Status: None   Collection Time: 02/07/23  4:20 PM  Result Value Ref Range   Color, Urine YELLOW YELLOW   APPearance CLEAR CLEAR   Specific Gravity, Urine 1.017 1.005 - 1.030   pH 5.0 5.0 - 8.0   Glucose, UA NEGATIVE NEGATIVE mg/dL   Hgb urine dipstick NEGATIVE NEGATIVE   Bilirubin Urine NEGATIVE NEGATIVE   Ketones, ur NEGATIVE NEGATIVE mg/dL   Protein, ur NEGATIVE NEGATIVE mg/dL   Nitrite NEGATIVE NEGATIVE   Leukocytes,Ua NEGATIVE NEGATIVE  hCG, quantitative, pregnancy     Status: None   Collection Time: 02/07/23  5:05 PM  Result Value Ref Range   hCG, Beta Chain, Quant, S 1 <5 mIU/mL     Assessment and Plan   1. Not currently pregnant   2. Abdominal pain, unspecified abdominal location   3. Nausea    Discharge home with precautions Recommended visit with her primary OB Dr. Elon Spanner to discuss  family-planning options Discussed copper IUD and Depo-Provera in detail here in the MAU Commended contraceptive precautions until being seen by Dr. Elon Spanner.  Isa Rankin [facility] 02/07/2023, 5:04 PM

## 2023-03-08 ENCOUNTER — Encounter: Payer: Self-pay | Admitting: Nurse Practitioner

## 2023-03-08 ENCOUNTER — Ambulatory Visit (INDEPENDENT_AMBULATORY_CARE_PROVIDER_SITE_OTHER): Payer: 59 | Admitting: Nurse Practitioner

## 2023-03-08 ENCOUNTER — Other Ambulatory Visit (HOSPITAL_COMMUNITY): Payer: Self-pay

## 2023-03-08 VITALS — BP 116/80 | HR 68 | Temp 98.0°F | Ht 59.0 in | Wt 174.0 lb

## 2023-03-08 DIAGNOSIS — G4700 Insomnia, unspecified: Secondary | ICD-10-CM | POA: Diagnosis not present

## 2023-03-08 DIAGNOSIS — E282 Polycystic ovarian syndrome: Secondary | ICD-10-CM | POA: Diagnosis not present

## 2023-03-08 DIAGNOSIS — D500 Iron deficiency anemia secondary to blood loss (chronic): Secondary | ICD-10-CM | POA: Diagnosis not present

## 2023-03-08 DIAGNOSIS — E559 Vitamin D deficiency, unspecified: Secondary | ICD-10-CM

## 2023-03-08 DIAGNOSIS — Z8632 Personal history of gestational diabetes: Secondary | ICD-10-CM

## 2023-03-08 DIAGNOSIS — F3132 Bipolar disorder, current episode depressed, moderate: Secondary | ICD-10-CM

## 2023-03-08 DIAGNOSIS — R739 Hyperglycemia, unspecified: Secondary | ICD-10-CM

## 2023-03-08 DIAGNOSIS — R5383 Other fatigue: Secondary | ICD-10-CM

## 2023-03-08 LAB — COMPREHENSIVE METABOLIC PANEL WITH GFR
ALT: 20 U/L (ref 0–35)
AST: 17 U/L (ref 0–37)
Albumin: 3.9 g/dL (ref 3.5–5.2)
Alkaline Phosphatase: 76 U/L (ref 39–117)
BUN: 12 mg/dL (ref 6–23)
CO2: 27 meq/L (ref 19–32)
Calcium: 8.7 mg/dL (ref 8.4–10.5)
Chloride: 107 meq/L (ref 96–112)
Creatinine, Ser: 0.74 mg/dL (ref 0.40–1.20)
GFR: 108.89 mL/min (ref >60.00)
Glucose, Bld: 74 mg/dL (ref 70–99)
Potassium: 4.3 meq/L (ref 3.5–5.1)
Sodium: 139 meq/L (ref 135–145)
Total Bilirubin: 0.5 mg/dL (ref 0.2–1.2)
Total Protein: 6.3 g/dL (ref 6.0–8.3)

## 2023-03-08 LAB — CBC WITH DIFFERENTIAL/PLATELET
Basophils Absolute: 0 10*3/uL (ref 0.0–0.1)
Basophils Relative: 0.7 % (ref 0.0–3.0)
Eosinophils Absolute: 0.1 10*3/uL (ref 0.0–0.7)
Eosinophils Relative: 1.7 % (ref 0.0–5.0)
HCT: 37.1 % (ref 36.0–46.0)
Hemoglobin: 11.6 g/dL — ABNORMAL LOW (ref 12.0–15.0)
Lymphocytes Relative: 48.8 % — ABNORMAL HIGH (ref 12.0–46.0)
Lymphs Abs: 2.6 10*3/uL (ref 0.7–4.0)
MCHC: 31.4 g/dL (ref 30.0–36.0)
MCV: 85.7 fl (ref 78.0–100.0)
Monocytes Absolute: 0.3 10*3/uL (ref 0.1–1.0)
Monocytes Relative: 5.9 % (ref 3.0–12.0)
Neutro Abs: 2.3 10*3/uL (ref 1.4–7.7)
Neutrophils Relative %: 42.9 % — ABNORMAL LOW (ref 43.0–77.0)
Platelets: 297 10*3/uL (ref 150.0–400.0)
RBC: 4.34 Mil/uL (ref 3.87–5.11)
RDW: 13.8 % (ref 11.5–15.5)
WBC: 5.3 10*3/uL (ref 4.0–10.5)

## 2023-03-08 LAB — POCT GLYCOSYLATED HEMOGLOBIN (HGB A1C)
HbA1c POC (<> result, manual entry): 6 % (ref 4.0–5.6)
HbA1c, POC (controlled diabetic range): 6 % (ref 0.0–7.0)
HbA1c, POC (prediabetic range): 6 % (ref 5.7–6.4)
Hemoglobin A1C: 6 % — AB (ref 4.0–5.6)

## 2023-03-08 LAB — LIPID PANEL
Cholesterol: 143 mg/dL (ref 0–200)
HDL: 59.6 mg/dL (ref >39.00)
LDL Cholesterol: 71 mg/dL (ref 0–99)
NonHDL: 83.06
Total CHOL/HDL Ratio: 2
Triglycerides: 59 mg/dL (ref 0.0–149.0)
VLDL: 11.8 mg/dL (ref 0.0–40.0)

## 2023-03-08 LAB — POCT URINE PREGNANCY: Preg Test, Ur: NEGATIVE

## 2023-03-08 LAB — VITAMIN D 25 HYDROXY (VIT D DEFICIENCY, FRACTURES): VITD: 7.79 ng/mL — ABNORMAL LOW (ref 30.00–100.00)

## 2023-03-08 LAB — TSH: TSH: 1.43 u[IU]/mL (ref 0.35–5.50)

## 2023-03-08 MED ORDER — SERTRALINE HCL 25 MG PO TABS
25.0000 mg | ORAL_TABLET | Freq: Every day | ORAL | 5 refills | Status: DC
  Filled 2023-03-08: qty 30, 30d supply, fill #0
  Filled 2023-05-03: qty 30, 30d supply, fill #1
  Filled 2023-06-12: qty 30, 30d supply, fill #2

## 2023-03-08 MED ORDER — TEMAZEPAM 7.5 MG PO CAPS
7.5000 mg | ORAL_CAPSULE | Freq: Every evening | ORAL | 0 refills | Status: DC | PRN
  Filled 2023-03-08: qty 30, 30d supply, fill #0

## 2023-03-08 NOTE — Assessment & Plan Note (Signed)
Chronic, worse after childbirth 6months ago, racing thoughts, difficulty falling, and staying asleep Previous use of restoril with significant improvement. Med discontinued 50yrs ago She drinks 200mg  od caffeine daily  Check THYROID, CBC, CMP Advised to quit caffeine consumption Sent restoril 7.5mg 

## 2023-03-08 NOTE — Patient Instructions (Signed)
Go to lab Switch multivitamin gummy to flinstone multivitamin with iron chewable 2tabs daily  Managing Depression, Adult Depression is a mental health condition that affects your thoughts, feelings, and actions. Being diagnosed with depression can bring you relief if you did not know why you have felt or behaved a certain way. It could also leave you feeling overwhelmed. Finding ways to manage your symptoms can help you feel more positive about your future. How to manage lifestyle changes Being depressed is difficult. Depression can increase the level of everyday stress. Stress can make depression symptoms worse. You may believe your symptoms cannot be managed or will never improve. However, there are many things you can try to help manage your symptoms. There is hope. Managing stress  Stress is your body's reaction to life changes and events, both good and bad. Stress can add to your feelings of depression. Learning to manage your stress can help lessen your feelings of depression. Try some of the following approaches to reducing your stress (stress reduction techniques): Listen to music that you enjoy and that inspires you. Try using a meditation app or take a meditation class. Develop a practice that helps you connect with your spiritual self. Walk in nature, pray, or go to a place of worship. Practice deep breathing. To do this, inhale slowly through your nose. Pause at the top of your inhale for a few seconds and then exhale slowly, letting yourself relax. Repeat this three or four times. Practice yoga to help relax and work your muscles. Choose a stress reduction technique that works for you. These techniques take time and practice to develop. Set aside 5-15 minutes a day to do them. Therapists can offer training in these techniques. Do these things to help manage stress: Keep a journal. Know your limits. Set healthy boundaries for yourself and others, such as saying "no" when you think  something is too much. Pay attention to how you react to certain situations. You may not be able to control everything, but you can change your reaction. Add humor to your life by watching funny movies or shows. Make time for activities that you enjoy and that relax you. Spend less time using electronics, especially at night before bed. The light from screens can make your brain think it is time to get up rather than go to bed.  Medicines Medicines, such as antidepressants, are often a part of treatment for depression. Talk with your pharmacist or health care provider about all the medicines, supplements, and herbal products that you take, their possible side effects, and what medicines and other products are safe to take together. Make sure to report any side effects you may have to your health care provider. Relationships Your health care provider may suggest family therapy, couples therapy, or individual therapy as part of your treatment. How to recognize changes Everyone responds differently to treatment for depression. As you recover from depression, you may start to: Have more interest in doing activities. Feel more hopeful. Have more energy. Eat a more regular amount of food. Have better mental focus. It is important to recognize if your depression is not getting better or is getting worse. The symptoms you had in the beginning may return, such as: Feeling tired. Eating too much or too little. Sleeping too much or too little. Feeling restless, agitated, or hopeless. Trouble focusing or making decisions. Having unexplained aches and pains. Feeling irritable, angry, or aggressive. If you or your family members notice these symptoms coming back, let  your health care provider know right away. Follow these instructions at home: Activity Try to get some form of exercise each day, such as walking. Try yoga, mindfulness, or other stress reduction techniques. Participate in group  activities if you are able. Lifestyle Get enough sleep. Cut down on or stop using caffeine, tobacco, alcohol, and any other harmful substances. Eat a healthy diet that includes plenty of vegetables, fruits, whole grains, low-fat dairy products, and lean protein. Limit foods that are high in solid fats, added sugar, or salt (sodium). General instructions Take over-the-counter and prescription medicines only as told by your health care provider. Keep all follow-up visits. It is important for your health care provider to check on your mood, behavior, and medicines. Your health care provider may need to make changes to your treatment. Where to find support Talking to others  Friends and family members can be sources of support and guidance. Talk to trusted friends or family members about your condition. Explain your symptoms and let them know that you are working with a health care provider to treat your depression. Tell friends and family how they can help. Finances Find mental health providers that fit with your financial situation. Talk with your health care provider if you are worried about access to food, housing, or medicine. Call your insurance company to learn about your co-pays and prescription plan. Where to find more information You can find support in your area from: Anxiety and Depression Association of America (ADAA): adaa.org Mental Health [place]: mentalhealthamerica.net [employer] on Mental Illness: nami.org Contact a health care provider if: You stop taking your antidepressant medicines, and you have any of these symptoms: Nausea. Headache. Light-headedness. Chills and body aches. Not being able to sleep (insomnia). You or your friends and family think your depression is getting worse. Get help right away if: You have thoughts of hurting yourself or others. Get help right away if you feel like you may hurt yourself or others, or have thoughts about taking your own  life. Go to your nearest emergency room or: Call 911. Call the National Suicide Prevention Lifeline at 984-309-1558 or 988. This is open 24 hours a day. Text the Crisis Text Line at (774)446-8542. This information is not intended to replace advice given to you by your health care provider. Make sure you discuss any questions you have with your health care provider. Document Revised: 10/12/2021 Document Reviewed: 10/12/2021 Elsevier Patient Education  2024 [employer].

## 2023-03-08 NOTE — Assessment & Plan Note (Addendum)
Previous use of zoloft, lamictal, klonopin, restoril. Prescribed by [facility]-psychiatry She Stopped all meds 05/2021 Today she reports decline in mood after childbirth 6months ago. She is a mother of 2 (46yrs and 6months old) and works fulltime Hopeless, insomnia, racing thoughts, fatigue, lack of motivation Support at home: boyfriend and parents Seh resumed weekly Sessions with therapist, but no improvement. She denies any hallucination or SI or HI ALCOHOL: 2x/month, 1-2glasses of wine Caffeine: celsius energy drink, 200mg  of can.  Resume zoloft 25mg  every day and restoril 7.5mg  at hs Continue counseling sessions Avoid ALCOHOL and caffeine consumption F/up in 87month

## 2023-03-08 NOTE — Assessment & Plan Note (Signed)
Repeat hgbA1c: 6% Advised about importance of heart healthy diet (specifically low sodium, low sugar and low carb) and daily exercise) Repeat labs in 3months

## 2023-03-08 NOTE — Progress Notes (Unsigned)
Established Patient Visit  Patient: Cheyenne Lozano   DOB: September 30, 1990   32 y.o. Female  MRN: [id number] Visit Date: 03/09/2023  Subjective:    Chief Complaint  Patient presents with   Postpartum Depression    Concerns with depression, check A1C, flu vaccine   HPI Hyperglycemia Repeat hgbA1c: 6% Advised about importance of heart healthy diet (specifically low sodium, low sugar and low carb) and daily exercise) Repeat labs in 3months  Insomnia Chronic, worse after childbirth 6months ago, racing thoughts, difficulty falling, and staying asleep Previous use of restoril with significant improvement. Med discontinued 4yrs ago She drinks 200mg  od caffeine daily  Check THYROID, CBC, CMP Advised to quit caffeine consumption Sent restoril 7.5mg    Polycystic ovary syndrome Repeat hgbA1c: 6.0% Repeat lipid panel  Bipolar disorder (HCC) Previous use of zoloft, lamictal, klonopin, restoril. Prescribed by [facility] She Stopped all meds 05/2021 Today she reports decline in mood after childbirth 6months ago. She is a mother of 2 (44yrs and 6months old) and works fulltime Hopeless, insomnia, racing thoughts, fatigue, lack of motivation Support at home: boyfriend and parents Seh resumed weekly Sessions with therapist, but no improvement. She denies any hallucination or SI or HI ALCOHOL: 2x/month, 1-2glasses of wine Caffeine: celsius energy drink, 200mg  of can.  Resume zoloft 25mg  every day and restoril 7.5mg  at hs Continue counseling sessions Avoid ALCOHOL and caffeine consumption F/up in 27month  Anemia Repeat CBC and iron panel Advised to maintain multivitamin with iron  Wt Readings from Last 3 Encounters:  03/08/23 174 lb (78.9 kg)  02/07/23 171 lb 14.4 oz (78 kg)  09/15/22 161 lb 3.2 oz (73.1 kg)       03/08/2023    9:43 AM 04/26/2019    1:31 PM 04/05/2019   11:36 AM  Depression screen PHQ 2/9  Decreased Interest 2 0 0  Down, Depressed, Hopeless 1 0  0  PHQ - 2 Score 3 0 0  Altered sleeping 3    Tired, decreased energy 3    Change in appetite 3    Feeling bad or failure about yourself  0    Trouble concentrating 3    Moving slowly or fidgety/restless 0    Suicidal thoughts 0    PHQ-9 Score 15    Difficult doing work/chores Somewhat difficult         03/08/2023    9:43 AM 10/16/2017    2:50 PM 07/25/2017    2:37 PM 06/23/2017   10:56 AM  GAD 7 : Generalized Anxiety Score  Nervous, Anxious, on Edge 3 0 0 0  Control/stop worrying 3 0 0 1  Worry too much - different things 3 0 0 0  Trouble relaxing 3 0 0 1  Restless 2 0 0 0  Easily annoyed or irritable 3 0 0 1  Afraid - awful might happen 0 0 0 0  Total GAD 7 Score 17 0 0 3  Anxiety Difficulty Somewhat difficult      Reviewed medical, surgical, and social history today  Medications: Outpatient Medications Prior to Visit  Medication Sig   acetaminophen (TYLENOL) 500 MG tablet Take 500 mg by mouth every 6 (six) hours as needed.   docusate sodium (COLACE) 100 MG capsule Take 1 capsule (100 mg total) by mouth 2 (two) times daily.   Multiple Vitamin (MULTIVITAMIN) tablet Take 1 tablet by mouth daily.   [DISCONTINUED] ibuprofen (ADVIL) 600 MG tablet  Take 1 tablet (600 mg total) by mouth every 6 (six) hours as needed.   [DISCONTINUED] ferrous sulfate 325 (65 FE) MG EC tablet Take 1 tablet (325 mg total) by mouth 2 (two) times daily. (Patient not taking: Reported on 03/08/2023)   [DISCONTINUED] oxyCODONE (OXY IR/ROXICODONE) 5 MG immediate release tablet Take 1 tablet (5 mg total) by mouth every 4 (four) hours as needed for severe pain. (Patient not taking: Reported on 03/08/2023)   [DISCONTINUED] Prenatal Vit-Fe Fumarate-FA (PRENATAL MULTIVITAMIN) TABS tablet Take 1 tablet by mouth daily. (Patient not taking: Reported on 03/08/2023)   No facility-administered medications prior to visit.   Reviewed past medical and social history.   ROS per HPI above      Objective:  BP 116/80 (BP  Location: Right Arm)   Pulse 68   Temp 98 F (36.7 C)   Ht 4\' 11"  (1.499 m)   Wt 174 lb (78.9 kg)   LMP 03/06/2023 (Exact Date)   SpO2 100%   Breastfeeding No   BMI 35.14 kg/m      Physical Exam  Results for orders placed or performed in visit on 03/08/23  TSH  Result Value Ref Range   TSH 1.43 0.35 - 5.50 uIU/mL  Iron, TIBC and Ferritin Panel  Result Value Ref Range   Iron 92 40 - 190 mcg/dL   TIBC 161 096 - 045 mcg/dL (calc)   %SAT 27 16 - 45 % (calc)   Ferritin 20 16 - 154 ng/mL  VITAMIN D 25 Hydroxy (Vit-D Deficiency, Fractures)  Result Value Ref Range   VITD 7.79 (L) 30.00 - 100.00 ng/mL  CBC with Differential/Platelet  Result Value Ref Range   WBC 5.3 4.0 - 10.5 K/uL   RBC 4.34 3.87 - 5.11 Mil/uL   Hemoglobin 11.6 (L) 12.0 - 15.0 g/dL   HCT 40.9 81.1 - 91.4 %   MCV 85.7 78.0 - 100.0 fl   MCHC 31.4 30.0 - 36.0 g/dL   RDW 78.2 95.6 - 21.3 %   Platelets 297.0 150.0 - 400.0 K/uL   Neutrophils Relative % 42.9 (L) 43.0 - 77.0 %   Lymphocytes Relative 48.8 (H) 12.0 - 46.0 %   Monocytes Relative 5.9 3.0 - 12.0 %   Eosinophils Relative 1.7 0.0 - 5.0 %   Basophils Relative 0.7 0.0 - 3.0 %   Neutro Abs 2.3 1.4 - 7.7 K/uL   Lymphs Abs 2.6 0.7 - 4.0 K/uL   Monocytes Absolute 0.3 0.1 - 1.0 K/uL   Eosinophils Absolute 0.1 0.0 - 0.7 K/uL   Basophils Absolute 0.0 0.0 - 0.1 K/uL  Comprehensive metabolic panel  Result Value Ref Range   Sodium 139 135 - 145 mEq/L   Potassium 4.3 3.5 - 5.1 mEq/L   Chloride 107 96 - 112 mEq/L   CO2 27 19 - 32 mEq/L   Glucose, Bld 74 70 - 99 mg/dL   BUN 12 6 - 23 mg/dL   Creatinine, Ser 0.86 0.40 - 1.20 mg/dL   Total Bilirubin 0.5 0.2 - 1.2 mg/dL   Alkaline Phosphatase 76 39 - 117 U/L   AST 17 0 - 37 U/L   ALT 20 0 - 35 U/L   Total Protein 6.3 6.0 - 8.3 g/dL   Albumin 3.9 3.5 - 5.2 g/dL   GFR 578.46 >96.29 mL/min   Calcium 8.7 8.4 - 10.5 mg/dL  Lipid panel  Result Value Ref Range   Cholesterol 143 0 - 200 mg/dL   Triglycerides 52.8  0.0 -  149.0 mg/dL   HDL 16.10 >96.04 mg/dL   VLDL 54.0 0.0 - 98.1 mg/dL   LDL Cholesterol 71 0 - 99 mg/dL   Total CHOL/HDL Ratio 2    NonHDL 83.06   POCT glycosylated hemoglobin (Hb A1C)  Result Value Ref Range   Hemoglobin A1C 6.0 (A) 4.0 - 5.6 %   HbA1c POC (<> result, manual entry) 6.0 4.0 - 5.6 %   HbA1c, POC (prediabetic range) 6.0 5.7 - 6.4 %   HbA1c, POC (controlled diabetic range) 6.0 0.0 - 7.0 %  POCT urine pregnancy  Result Value Ref Range   Preg Test, Ur Negative Negative      Assessment & Plan:    Problem List Items Addressed This Visit     Anemia    Repeat CBC and iron panel Advised to maintain multivitamin with iron      Relevant Orders   Iron, TIBC and Ferritin Panel (Completed)   CBC with Differential/Platelet (Completed)   Bipolar disorder (HCC)    Previous use of zoloft, lamictal, klonopin, restoril. Prescribed by [facility] She Stopped all meds 05/2021 Today she reports decline in mood after childbirth 6months ago. She is a mother of 2 (68yrs and 6months old) and works fulltime Hopeless, insomnia, racing thoughts, fatigue, lack of motivation Support at home: boyfriend and parents Seh resumed weekly Sessions with therapist, but no improvement. She denies any hallucination or SI or HI ALCOHOL: 2x/month, 1-2glasses of wine Caffeine: celsius energy drink, 200mg  of can.  Resume zoloft 25mg  every day and restoril 7.5mg  at hs Continue counseling sessions Avoid ALCOHOL and caffeine consumption F/up in 78month      Relevant Medications   sertraline (ZOLOFT) 25 MG tablet   History of gestational diabetes mellitus   Relevant Orders   POCT glycosylated hemoglobin (Hb A1C) (Completed)   Hyperglycemia - Primary    Repeat hgbA1c: 6% Advised about importance of heart healthy diet (specifically low sodium, low sugar and low carb) and daily exercise) Repeat labs in 3months      Relevant Orders   POCT glycosylated hemoglobin (Hb A1C) (Completed)    Comprehensive metabolic panel (Completed)   Insomnia    Chronic, worse after childbirth 6months ago, racing thoughts, difficulty falling, and staying asleep Previous use of restoril with significant improvement. Med discontinued 24yrs ago She drinks 200mg  od caffeine daily  Check THYROID, CBC, CMP Advised to quit caffeine consumption Sent restoril 7.5mg        Relevant Medications   temazepam (RESTORIL) 7.5 MG capsule   Polycystic ovary syndrome    Repeat hgbA1c: 6.0% Repeat lipid panel      Relevant Orders   Comprehensive metabolic panel (Completed)   Lipid panel (Completed)   Other Visit Diagnoses     Vitamin D insufficiency       Relevant Orders   VITAMIN D 25 Hydroxy (Vit-D Deficiency, Fractures) (Completed)   Other fatigue       Relevant Orders   TSH (Completed)   CBC with Differential/Platelet (Completed)   Comprehensive metabolic panel (Completed)   POCT urine pregnancy (Completed)      Return in about 4 weeks (around 04/05/2023) for depression and anxiety.     Alysia Penna, NP

## 2023-03-08 NOTE — Assessment & Plan Note (Signed)
Repeat hgbA1c: 6.0% Repeat lipid panel

## 2023-03-08 NOTE — Assessment & Plan Note (Signed)
Repeat CBC and iron panel Advised to maintain multivitamin with iron

## 2023-03-09 ENCOUNTER — Other Ambulatory Visit (HOSPITAL_COMMUNITY): Payer: Self-pay

## 2023-03-09 DIAGNOSIS — E559 Vitamin D deficiency, unspecified: Secondary | ICD-10-CM | POA: Insufficient documentation

## 2023-03-09 LAB — IRON,TIBC AND FERRITIN PANEL
%SAT: 27 % (calc) (ref 16–45)
Ferritin: 20 ng/mL (ref 16–154)
Iron: 92 ug/dL (ref 40–190)
TIBC: 335 mcg/dL (calc) (ref 250–450)

## 2023-03-09 MED ORDER — VITAMIN D (ERGOCALCIFEROL) 1.25 MG (50000 UNIT) PO CAPS
50000.0000 [IU] | ORAL_CAPSULE | ORAL | 0 refills | Status: DC
Start: 2023-03-09 — End: 2023-07-06
  Filled 2023-03-09: qty 12, 84d supply, fill #0

## 2023-03-09 NOTE — Addendum Note (Signed)
Addended by: Alysia Penna L on: 03/09/2023 02:15 PM   Modules accepted: Orders

## 2023-03-22 ENCOUNTER — Encounter: Payer: Self-pay | Admitting: Nurse Practitioner

## 2023-04-04 ENCOUNTER — Encounter: Payer: Self-pay | Admitting: Nurse Practitioner

## 2023-04-04 ENCOUNTER — Other Ambulatory Visit (HOSPITAL_COMMUNITY): Payer: Self-pay

## 2023-04-04 ENCOUNTER — Ambulatory Visit: Payer: 59 | Admitting: Nurse Practitioner

## 2023-04-04 VITALS — BP 116/74 | HR 60 | Temp 98.2°F | Ht 59.0 in | Wt 172.6 lb

## 2023-04-04 DIAGNOSIS — Z23 Encounter for immunization: Secondary | ICD-10-CM

## 2023-04-04 DIAGNOSIS — J209 Acute bronchitis, unspecified: Secondary | ICD-10-CM | POA: Diagnosis not present

## 2023-04-04 DIAGNOSIS — M545 Low back pain, unspecified: Secondary | ICD-10-CM | POA: Diagnosis not present

## 2023-04-04 MED ORDER — BENZONATATE 200 MG PO CAPS
200.0000 mg | ORAL_CAPSULE | Freq: Three times a day (TID) | ORAL | 0 refills | Status: DC | PRN
Start: 2023-04-04 — End: 2023-07-06
  Filled 2023-04-04: qty 21, 7d supply, fill #0

## 2023-04-04 MED ORDER — CYCLOBENZAPRINE HCL 5 MG PO TABS
5.0000 mg | ORAL_TABLET | Freq: Every day | ORAL | 0 refills | Status: DC
Start: 2023-04-04 — End: 2023-07-06
  Filled 2023-04-04: qty 14, 14d supply, fill #0

## 2023-04-04 MED ORDER — ALBUTEROL SULFATE HFA 108 (90 BASE) MCG/ACT IN AERS
1.0000 | INHALATION_SPRAY | Freq: Four times a day (QID) | RESPIRATORY_TRACT | 0 refills | Status: DC | PRN
Start: 2023-04-04 — End: 2023-07-06
  Filled 2023-04-04: qty 6.7, 20d supply, fill #0

## 2023-04-04 MED ORDER — PREDNISONE 20 MG PO TABS
ORAL_TABLET | ORAL | 0 refills | Status: AC
Start: 2023-04-04 — End: 2023-04-12
  Filled 2023-04-04: qty 10, 8d supply, fill #0

## 2023-04-04 NOTE — Progress Notes (Signed)
Acute Office Visit  Subjective:    Patient ID: Cheyenne Lozano, female    DOB: 07/20/1990, 32 y.o.   MRN: [id number]  Chief Complaint  Patient presents with   Follow-up    Cough started 2 weeks    Back Pain    Yesterday     Cough This is a new problem. The current episode started 1 to 4 weeks ago. The problem has been unchanged. The cough is Non-productive. Associated symptoms include nasal congestion, postnasal drip and wheezing. Pertinent negatives include no chest pain, chills, ear congestion, ear pain, fever, headaches, heartburn, hemoptysis, myalgias, rash, rhinorrhea, sore throat, shortness of breath, sweats or weight loss. The symptoms are aggravated by lying down. She has tried OTC cough suppressant for the symptoms. The treatment provided mild relief.  Back Pain This is a new problem. The current episode started yesterday. The problem occurs constantly. The problem is unchanged. The pain is present in the lumbar spine and thoracic spine. The quality of the pain is described as aching and cramping. The pain does not radiate. The pain is moderate. The pain is The same all the time. The symptoms are aggravated by bending and twisting. Pertinent negatives include no abdominal pain, bladder incontinence, bowel incontinence, chest pain, dysuria, fever, headaches, leg pain, numbness, paresis, paresthesias, pelvic pain, perianal numbness, tingling, weakness or weight loss. Risk factors include poor posture and obesity. She has tried NSAIDs and analgesics for the symptoms. The treatment provided mild relief.  Onset of cough 2weeks ago after viral URI, negative for flu and COVID. 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  Outpatient Medications Prior to Visit  Medication Sig   acetaminophen (TYLENOL) 500 MG tablet Take 500 mg by mouth every 6 (six) hours as needed.   docusate sodium (COLACE) 100 MG  capsule Take 1 capsule (100 mg total) by mouth 2 (two) times daily.   Multiple Vitamin (MULTIVITAMIN) tablet Take 1 tablet by mouth daily.   sertraline (ZOLOFT) 25 MG tablet Take 1 tablet (25 mg total) by mouth daily.   Vitamin D, Ergocalciferol, (DRISDOL) 1.25 MG (50000 UNIT) CAPS capsule Take 1 capsule (50,000 Units total) by mouth every 7 (seven) days.   [DISCONTINUED] temazepam (RESTORIL) 7.5 MG capsule Take 1 capsule (7.5 mg total) by mouth at bedtime as needed for sleep.   No facility-administered medications prior to visit.    Reviewed past medical and social history.  Review of Systems  Constitutional:  Negative for chills, fever and weight loss.  HENT:  Positive for postnasal drip. Negative for ear pain, rhinorrhea and sore throat.   Respiratory:  Positive for cough and wheezing. Negative for hemoptysis and shortness of breath.   Cardiovascular:  Negative for chest pain.  Gastrointestinal:  Negative for abdominal pain, bowel incontinence and heartburn.  Genitourinary:  Negative for bladder incontinence, dysuria and pelvic pain.  Musculoskeletal:  Positive for back pain. Negative for myalgias.  Skin:  Negative for rash.  Neurological:  Negative for tingling, weakness, numbness, headaches and paresthesias.   Per HPI     Objective:    Physical Exam Vitals and nursing note reviewed.  Constitutional:      General: She is not in acute distress. Cardiovascular:     Rate and Rhythm: Normal rate and regular rhythm.     Pulses: Normal pulses.     Heart sounds: Normal heart sounds.  Pulmonary:  Effort: Pulmonary effort is normal.     Breath sounds: Normal breath sounds.  Musculoskeletal:     Cervical back: Normal.     Thoracic back: Tenderness present. No bony tenderness. Normal range of motion.     Lumbar back: Tenderness present. No bony tenderness. Normal range of motion. Negative right straight leg raise test and negative left straight leg raise test.  Neurological:      Mental Status: She is alert and oriented to person, place, and time.    BP 116/74   Pulse 60   Temp 98.2 F (36.8 C) (Temporal)   Ht 4\' 11"  (1.499 m)   Wt 172 lb 9.6 oz (78.3 kg)   LMP 03/06/2023 (Exact Date)   SpO2 100%   BMI 34.86 kg/m    No results found for any visits on 04/04/23.     Assessment & Plan:   Problem List Items Addressed This Visit   None Visit Diagnoses     Acute bronchitis, unspecified organism    -  Primary   Relevant Medications   cyclobenzaprine (FLEXERIL) 5 MG tablet   predniSONE (DELTASONE) 20 MG tablet   albuterol (VENTOLIN HFA) 108 (90 Base) MCG/ACT inhaler   benzonatate (TESSALON) 200 MG capsule   Acute bilateral low back pain without sciatica       Relevant Medications   cyclobenzaprine (FLEXERIL) 5 MG tablet   predniSONE (DELTASONE) 20 MG tablet   Immunization due       Relevant Orders   Flu vaccine trivalent PF, 6mos and older(Flulaval,Afluria,Fluarix,Fluzone) (Completed)   HPV 9-valent vaccine,Recombinat (Completed)      Meds ordered this encounter  Medications   cyclobenzaprine (FLEXERIL) 5 MG tablet    Sig: Take 1 tablet (5 mg total) by mouth at bedtime.    Dispense:  14 tablet    Refill:  0    Order Specific Question:   Supervising Provider    Answer:   Nadene Rubins ALFRED [[id number]]   predniSONE (DELTASONE) 20 MG tablet    Sig: Take 2 tablets by mouth daily with breakfast for 2 days, THEN 1.5 tablets daily with breakfast for 2 days, THEN 1 tablet daily with breakfast for 2 days, THEN 0.5 tablets daily with breakfast for 2 days.    Dispense:  10 tablet    Refill:  0    Order Specific Question:   Supervising Provider    Answer:   Nadene Rubins ALFRED [[id number]]   albuterol (VENTOLIN HFA) 108 (90 Base) MCG/ACT inhaler    Sig: Inhale 1-2 puffs into the lungs every 6 (six) hours as needed.    Dispense:  8 g    Refill:  0    Order Specific Question:   Supervising Provider    Answer:   Nadene Rubins ALFRED [[id number]]   benzonatate  (TESSALON) 200 MG capsule    Sig: Take 1 capsule (200 mg total) by mouth 3 (three) times daily as needed.    Dispense:  21 capsule    Refill:  0    Order Specific Question:   Supervising Provider    Answer:   Mliss Sax [[id number]]   Return if symptoms worsen or fail to improve.    Alysia Penna, NP

## 2023-04-04 NOTE — Patient Instructions (Signed)
Ok to alternate between benzonatate and robitussin for cough if needed. Maintain adequate oral hydration Ok to use tylenol 500mg  every 8hrs as needed for pain Avoid all OVER THE COUNTER NSAID's while taking oral prednisone.  Low Back Sprain or Strain Rehab Ask your health care provider which exercises are safe for you. Do exercises exactly as told by your health care provider and adjust them as directed. It is normal to feel mild stretching, pulling, tightness, or discomfort as you do these exercises. Stop right away if you feel sudden pain or your pain gets worse. Do not begin these exercises until told by your health care provider. Stretching and range-of-motion exercises These exercises warm up your muscles and joints and improve the movement and flexibility of your back. These exercises also help to relieve pain, numbness, and tingling. Lumbar rotation  Lie on your back on a firm bed or the floor with your knees bent. Straighten your arms out to your sides so each arm forms a 90-degree angle (right angle) with a side of your body. Slowly move (rotate) both of your knees to one side of your body until you feel a stretch in your lower back (lumbar). Try not to let your shoulders lift off the floor. Hold this position for __________ seconds. Tense your abdominal muscles and slowly move your knees back to the starting position. Repeat this exercise on the other side of your body. Repeat __________ times. Complete this exercise __________ times a day. Single knee to chest  Lie on your back on a firm bed or the floor with both legs straight. Bend one of your knees. Use your hands to move your knee up toward your chest until you feel a gentle stretch in your lower back and buttock. Hold your leg in this position by holding on to the front of your knee. Keep your other leg as straight as possible. Hold this position for __________ seconds. Slowly return to the starting position. Repeat with  your other leg. Repeat __________ times. Complete this exercise __________ times a day. Prone extension on elbows  Lie on your abdomen on a firm bed or the floor (prone position). Prop yourself up on your elbows. Use your arms to help lift your chest up until you feel a gentle stretch in your abdomen and your lower back. This will place some of your body weight on your elbows. If this is uncomfortable, try stacking pillows under your chest. Your hips should stay down, against the surface that you are lying on. Keep your hip and back muscles relaxed. Hold this position for __________ seconds. Slowly relax your upper body and return to the starting position. Repeat __________ times. Complete this exercise __________ times a day. Strengthening exercises These exercises build strength and endurance in your back. Endurance is the ability to use your muscles for a long time, even after they get tired. Pelvic tilt This exercise strengthens the muscles that lie deep in the abdomen. Lie on your back on a firm bed or the floor with your legs extended. Bend your knees so they are pointing toward the ceiling and your feet are flat on the floor. Tighten your lower abdominal muscles to press your lower back against the floor. This motion will tilt your pelvis so your tailbone points up toward the ceiling instead of pointing to your feet or the floor. To help with this exercise, you may place a small towel under your lower back and try to push your back into the  towel. Hold this position for __________ seconds. Let your muscles relax completely before you repeat this exercise. Repeat __________ times. Complete this exercise __________ times a day. Alternating arm and leg raises  Get on your hands and knees on a firm surface. If you are on a hard floor, you may want to use padding, such as an exercise mat, to cushion your knees. Line up your arms and legs. Your hands should be directly below your  shoulders, and your knees should be directly below your hips. Lift your left leg behind you. At the same time, raise your right arm and straighten it in front of you. Do not lift your leg higher than your hip. Do not lift your arm higher than your shoulder. Keep your abdominal and back muscles tight. Keep your hips facing the ground. Do not arch your back. Keep your balance carefully, and do not hold your breath. Hold this position for __________ seconds. Slowly return to the starting position. Repeat with your right leg and your left arm. Repeat __________ times. Complete this exercise __________ times a day. Abdominal set with straight leg raise  Lie on your back on a firm bed or the floor. Bend one of your knees and keep your other leg straight. Tense your abdominal muscles and lift your straight leg up, 4-6 inches (10-15 cm) off the ground. Keep your abdominal muscles tight and hold this position for __________ seconds. Do not hold your breath. Do not arch your back. Keep it flat against the ground. Keep your abdominal muscles tense as you slowly lower your leg back to the starting position. Repeat with your other leg. Repeat __________ times. Complete this exercise __________ times a day. Single leg lower with bent knees Lie on your back on a firm bed or the floor. Tense your abdominal muscles and lift your feet off the floor, one foot at a time, so your knees and hips are bent in 90-degree angles (right angles). Your knees should be over your hips and your lower legs should be parallel to the floor. Keeping your abdominal muscles tense and your knee bent, slowly lower one of your legs so your toe touches the ground. Lift your leg back up to return to the starting position. Do not hold your breath. Do not let your back arch. Keep your back flat against the ground. Repeat with your other leg. Repeat __________ times. Complete this exercise __________ times a day. Posture and body  mechanics Good posture and healthy body mechanics can help to relieve stress in your body's tissues and joints. Body mechanics refers to the movements and positions of your body while you do your daily activities. Posture is part of body mechanics. Good posture means: Your spine is in its natural S-curve position (neutral). Your shoulders are pulled back slightly. Your head is not tipped forward (neutral). Follow these guidelines to improve your posture and body mechanics in your everyday activities. Standing  When standing, keep your spine neutral and your feet about hip-width apart. Keep a slight bend in your knees. Your ears, shoulders, and hips should line up. When you do a task in which you stand in one place for a long time, place one foot up on a stable object that is 2-4 inches (5-10 cm) high, such as a footstool. This helps keep your spine neutral. Sitting  When sitting, keep your spine neutral and keep your feet flat on the floor. Use a footrest, if necessary, and keep your thighs parallel to  the floor. Avoid rounding your shoulders, and avoid tilting your head forward. When working at a desk or a computer, keep your desk at a height where your hands are slightly lower than your elbows. Slide your chair under your desk so you are close enough to maintain good posture. When working at a computer, place your monitor at a height where you are looking straight ahead and you do not have to tilt your head forward or downward to look at the screen. Resting When lying down and resting, avoid positions that are most painful for you. If you have pain with activities such as sitting, bending, stooping, or squatting, lie in a position in which your body does not bend very much. For example, avoid curling up on your side with your arms and knees near your chest (fetal position). If you have pain with activities such as standing for a long time or reaching with your arms, lie with your spine in a  neutral position and bend your knees slightly. Try the following positions: Lying on your side with a pillow between your knees. Lying on your back with a pillow under your knees. Lifting  When lifting objects, keep your feet at least shoulder-width apart and tighten your abdominal muscles. Bend your knees and hips and keep your spine neutral. It is important to lift using the strength of your legs, not your back. Do not lock your knees straight out. Always ask for help to lift heavy or awkward objects. This information is not intended to replace advice given to you by your health care provider. Make sure you discuss any questions you have with your health care provider. Document Revised: 10/10/2022 Document Reviewed: 08/24/2020 [employer]vier Patient Education  2024 [employer].

## 2023-06-22 ENCOUNTER — Other Ambulatory Visit (HOSPITAL_COMMUNITY): Payer: Self-pay

## 2023-07-06 ENCOUNTER — Encounter: Payer: Self-pay | Admitting: Nurse Practitioner

## 2023-07-06 ENCOUNTER — Ambulatory Visit: Payer: 59 | Admitting: Nurse Practitioner

## 2023-07-06 VITALS — BP 133/74 | HR 60 | Temp 97.6°F | Resp 18 | Wt 172.2 lb

## 2023-07-06 DIAGNOSIS — F411 Generalized anxiety disorder: Secondary | ICD-10-CM | POA: Diagnosis not present

## 2023-07-06 DIAGNOSIS — F3132 Bipolar disorder, current episode depressed, moderate: Secondary | ICD-10-CM

## 2023-07-06 MED ORDER — SERTRALINE HCL 50 MG PO TABS
50.0000 mg | ORAL_TABLET | Freq: Every day | ORAL | 5 refills | Status: DC
Start: 2023-07-06 — End: 2023-08-04

## 2023-07-06 MED ORDER — ALPRAZOLAM 0.25 MG PO TABS: 0.2500 mg | ORAL_TABLET | Freq: Two times a day (BID) | ORAL | 0 refills | Status: DC | PRN

## 2023-07-06 NOTE — Assessment & Plan Note (Signed)
Increased anxiety due to custody battle with son's father. She Feels restless, palpitation, difficulty with focus, and disrupted sleep No ALCOHOL No improvement with therapy sessions 2x/week, pilates and caffeine elimination. Denies any SI/HI.  Current use of zoloft 25mg  daily, increased to 50mg  Add temporary supple of alprazolam 0.25mg  BID prn Maintain restoril at hs and sessions with the therapist F/up in 39month

## 2023-07-06 NOTE — Progress Notes (Signed)
Established Patient Visit  Patient: Cheyenne Lozano   DOB: 1991/02/28   33 y.o. Female  MRN: [id number] Visit Date: 07/06/2023  Subjective:    Chief Complaint  Patient presents with   OFFICE VISIT     PT C/O of panic attacks everyday while at work; with multiple episodes  per day for 3 weeks    HPI Generalized anxiety disorder Increased anxiety due to custody battle with son's father. She Feels restless, palpitation, difficulty with focus, and disrupted sleep No ALCOHOL No improvement with therapy sessions 2x/week, pilates and caffeine elimination. Denies any SI/HI.  Current use of zoloft 25mg  daily, increased to 50mg  Add temporary supple of alprazolam 0.25mg  BID prn Maintain restoril at hs and sessions with the therapist F/up in 31month  Reviewed medical, surgical, and social history today  Medications: Outpatient Medications Prior to Visit  Medication Sig   [DISCONTINUED] sertraline (ZOLOFT) 25 MG tablet Take 1 tablet (25 mg total) by mouth daily.   [DISCONTINUED] acetaminophen (TYLENOL) 500 MG tablet Take 500 mg by mouth every 6 (six) hours as needed. (Patient not taking: Reported on 07/06/2023)   [DISCONTINUED] albuterol (VENTOLIN HFA) 108 (90 Base) MCG/ACT inhaler Inhale 1-2 puffs into the lungs every 6 (six) hours as needed. (Patient not taking: Reported on 07/06/2023)   [DISCONTINUED] benzonatate (TESSALON) 200 MG capsule Take 1 capsule (200 mg total) by mouth 3 (three) times daily as needed. (Patient not taking: Reported on 07/06/2023)   [DISCONTINUED] cyclobenzaprine (FLEXERIL) 5 MG tablet Take 1 tablet (5 mg total) by mouth at bedtime. (Patient not taking: Reported on 07/06/2023)   [DISCONTINUED] docusate sodium (COLACE) 100 MG capsule Take 1 capsule (100 mg total) by mouth 2 (two) times daily. (Patient not taking: Reported on 07/06/2023)   [DISCONTINUED] Multiple Vitamin (MULTIVITAMIN) tablet Take 1 tablet by mouth daily. (Patient not taking: Reported on  07/06/2023)   [DISCONTINUED] Vitamin D, Ergocalciferol, (DRISDOL) 1.25 MG (50000 UNIT) CAPS capsule Take 1 capsule (50,000 Units total) by mouth every 7 (seven) days. (Patient not taking: Reported on 07/06/2023)   No facility-administered medications prior to visit.   Reviewed past medical and social history.   ROS per HPI above      Objective:  BP 133/74 (BP Location: Left Arm, Patient Position: Sitting, Cuff Size: Large)   Pulse 60   Temp 97.6 F (36.4 C) (Temporal)   Resp 18   Wt 172 lb 3.2 oz (78.1 kg)   LMP 06/12/2023 (Exact Date)   SpO2 100%   Breastfeeding No   BMI 34.78 kg/m      Physical Exam Vitals and nursing note reviewed.  Cardiovascular:     Rate and Rhythm: Normal rate.     Pulses: Normal pulses.  Pulmonary:     Effort: Pulmonary effort is normal.  Neurological:     Mental Status: She is alert.  Psychiatric:        Attention and Perception: Attention normal.        Mood and Affect: Mood is anxious.        Speech: Speech normal.        Behavior: Behavior is cooperative.        Thought Content: Thought content normal.        Cognition and Memory: Cognition and memory normal.        Judgment: Judgment normal.     No results found for any visits on 07/06/23.  Assessment & Plan:    Problem List Items Addressed This Visit     Bipolar disorder (HCC)   Relevant Medications   sertraline (ZOLOFT) 50 MG tablet   Generalized anxiety disorder - Primary   Increased anxiety due to custody battle with son's father. She Feels restless, palpitation, difficulty with focus, and disrupted sleep No ALCOHOL No improvement with therapy sessions 2x/week, pilates and caffeine elimination. Denies any SI/HI.  Current use of zoloft 25mg  daily, increased to 50mg  Add temporary supple of alprazolam 0.25mg  BID prn Maintain restoril at hs and sessions with the therapist F/up in 31month      Relevant Medications   sertraline (ZOLOFT) 50 MG tablet   ALPRAZolam (XANAX)  0.25 MG tablet   Return in about 4 weeks (around 08/03/2023) for depression and anxiety.     Alysia Penna, NP

## 2023-07-06 NOTE — Patient Instructions (Signed)
Increase zoloft dose to 50mg  daily Maintain restoril dose and sessions with therapist. Use alprazolam BID as needed. Do not take and drive due to risk of sedation. Call 911 or [phone] if mood gets worse.  Managing Anxiety, Adult After being diagnosed with anxiety, you may be relieved to know why you have felt or behaved a certain way. You may also feel overwhelmed about the treatment ahead and what it will mean for your life. With care and support, you can manage your anxiety. How to manage lifestyle changes Understanding the difference between stress and anxiety Although stress can play a role in anxiety, it is not the same as anxiety. Stress is your body's reaction to life changes and events, both good and bad. Stress is often caused by something external, such as a deadline, test, or competition. It normally goes away after the event has ended and will last just a few hours. But, stress can be ongoing and can lead to more than just stress. Anxiety is caused by something internal, such as imagining a terrible outcome or worrying that something will go wrong that will greatly upset you. Anxiety often does not go away even after the event is over, and it can become a long-term (chronic) worry. Lowering stress and anxiety Talk with your health care provider or a counselor to learn more about lowering anxiety and stress. They may suggest tension-reduction techniques, such as: Music. Spend time creating or listening to music that you enjoy and that inspires you. Mindfulness-based meditation. Practice being aware of your normal breaths while not trying to control your breathing. It can be done while sitting or walking. Centering prayer. Focus on a word, phrase, or sacred image that means something to you and brings you peace. Deep breathing. Expand your stomach and inhale slowly through your nose. Hold your breath for 3-5 seconds. Then breathe out slowly, letting your stomach muscles relax. Self-talk. Learn  to notice and spot thought patterns that lead to anxiety reactions. Change those patterns to thoughts that feel peaceful. Muscle relaxation. Take time to tense muscles and then relax them. Choose a tension-reduction technique that fits your lifestyle and personality. These techniques take time and practice. Set aside 5-15 minutes a day to do them. Specialized therapists can offer counseling and training in these techniques. The training to help with anxiety may be covered by some insurance plans. Other things you can do to manage stress and anxiety include: Keeping a stress diary. This can help you learn what triggers your reaction and then learn ways to manage your response. Thinking about how you react to certain situations. You may not be able to control everything, but you can control your response. Making time for activities that help you relax and not feeling guilty about spending your time in this way. Doing visual imagery. This involves imagining or creating mental pictures to help you relax. Practicing yoga. Through yoga poses, you can lower tension and relax.  Medicines Medicines for anxiety include: Antidepressant medicines. These are usually prescribed for long-term daily control. Anti-anxiety medicines. These may be added in severe cases, especially when panic attacks occur. When used together, medicines, psychotherapy, and tension-reduction techniques may be the most effective treatment. Relationships Relationships can play a big part in helping you recover. Spend more time connecting with trusted friends and family members. Think about going to couples counseling if you have a partner, taking family education classes, or going to family therapy. Therapy can help you and others better understand your anxiety.  How to recognize changes in your anxiety Everyone responds differently to treatment for anxiety. Recovery from anxiety happens when symptoms lessen and stop interfering with your  daily life at home or work. This may mean that you will start to: Have better concentration and focus. Worry will interfere less in your daily thinking. Sleep better. Be less irritable. Have more energy. Have improved memory. Try to recognize when your condition is getting worse. Contact your provider if your symptoms interfere with home or work and you feel like your condition is not improving. Follow these instructions at home: Activity Exercise. Adults should: Exercise for at least 150 minutes each week. The exercise should increase your heart rate and make you sweat (moderate-intensity exercise). Do strengthening exercises at least twice a week. Get the right amount and quality of sleep. Most adults need 7-9 hours of sleep each night. Lifestyle  Eat a healthy diet that includes plenty of vegetables, fruits, whole grains, low-fat dairy products, and lean protein. Do not eat a lot of foods that are high in fats, added sugars, or salt (sodium). Make choices that simplify your life. Do not use any products that contain nicotine or tobacco. These products include cigarettes, chewing tobacco, and vaping devices, such as e-cigarettes. If you need help quitting, ask your provider. Avoid caffeine, alcohol, and certain over-the-counter cold medicines. These may make you feel worse. Ask your pharmacist which medicines to avoid. General instructions Take over-the-counter and prescription medicines only as told by your provider. Keep all follow-up visits. This is to make sure you are managing your anxiety well or if you need more support. Where to find support You can get help and support from: Self-help groups. Online and [employer]. A trusted spiritual leader. Couples counseling. Family education classes. Family therapy. Where to find more information You may find that joining a support group helps you deal with your anxiety. The following sources can help you find counselors or  support groups near you: Mental Health America: mentalhealthamerica.net Anxiety and Depression Association of [place] (ADAA): adaa.org [employer] Mental Illness (NAMI): nami.org Contact a health care provider if: You have a hard time staying focused or finishing tasks. You spend many hours a day feeling worried about everyday life. You are very tired because you cannot stop worrying. You start to have headaches or often feel tense. You have chronic nausea or diarrhea. Get help right away if: Your heart feels like it is racing. You have shortness of breath. You have thoughts of hurting yourself or others. Get help right away if you feel like you may hurt yourself or others, or have thoughts about taking your own life. Go to your nearest emergency room or: Call 911. Call the National Suicide Prevention Lifeline at (989)826-9981 or 988. This is open 24 hours a day. Text the Crisis Text Line at 401-765-4478. This information is not intended to replace advice given to you by your health care provider. Make sure you discuss any questions you have with your health care provider. Document Revised: 03/15/2022 Document Reviewed: 09/27/2020 Elsevier Patient Education  2024 [employer].

## 2023-08-04 ENCOUNTER — Ambulatory Visit: Payer: 59 | Admitting: Nurse Practitioner

## 2023-08-04 ENCOUNTER — Encounter: Payer: Self-pay | Admitting: Nurse Practitioner

## 2023-08-04 VITALS — BP 124/72 | HR 77 | Temp 98.4°F | Ht 59.0 in | Wt 172.4 lb

## 2023-08-04 DIAGNOSIS — F411 Generalized anxiety disorder: Secondary | ICD-10-CM

## 2023-08-04 DIAGNOSIS — J029 Acute pharyngitis, unspecified: Secondary | ICD-10-CM | POA: Diagnosis not present

## 2023-08-04 DIAGNOSIS — F3131 Bipolar disorder, current episode depressed, mild: Secondary | ICD-10-CM | POA: Diagnosis not present

## 2023-08-04 DIAGNOSIS — J101 Influenza due to other identified influenza virus with other respiratory manifestations: Secondary | ICD-10-CM | POA: Diagnosis not present

## 2023-08-04 LAB — POCT RAPID STREP A (OFFICE): Rapid Strep A Screen: NEGATIVE

## 2023-08-04 LAB — POCT INFLUENZA A/B
Influenza A, POC: POSITIVE — AB
Influenza B, POC: NEGATIVE

## 2023-08-04 LAB — POC COVID19 BINAXNOW: SARS Coronavirus 2 Ag: NEGATIVE

## 2023-08-04 MED ORDER — OSELTAMIVIR PHOSPHATE 75 MG PO CAPS: 75.0000 mg | ORAL_CAPSULE | Freq: Two times a day (BID) | ORAL | 0 refills | Status: AC

## 2023-08-04 MED ORDER — SERTRALINE HCL 50 MG PO TABS: 50.0000 mg | ORAL_TABLET | Freq: Every day | ORAL | 3 refills | Status: DC

## 2023-08-04 MED ORDER — PROMETHAZINE-DM 6.25-15 MG/5ML PO SYRP: 5.0000 mL | ORAL_SOLUTION | Freq: Three times a day (TID) | ORAL | 0 refills | Status: DC | PRN

## 2023-08-04 NOTE — Assessment & Plan Note (Signed)
Improved mood with zoloft 50mg .  She has used alprazolam 3x/week in PM. Increased anxiety related to work stress. Has continued counseling sessions  Maintain med dose F/up in 2months

## 2023-08-04 NOTE — Progress Notes (Signed)
Established Patient Visit  Patient: Cheyenne Lozano   DOB: July 15, 1990   32 y.o. Female  MRN: [id number] Visit Date: 08/04/2023  Subjective:    Chief Complaint  Patient presents with   Anxiety and Depression    Follow up, concerns with being exposed by son with Flu and Strep, cough, chills, bodyaches   URI  This is a new problem. The current episode started in the past 7 days. The problem has been unchanged. The maximum temperature recorded prior to her arrival was 100.4 - 100.9 F. Associated symptoms include congestion, coughing, headaches, joint pain, rhinorrhea, sinus pain, sneezing, a sore throat and swollen glands. Pertinent negatives include no abdominal pain, chest pain, diarrhea, dysuria, ear pain, joint swelling, nausea, neck pain, plugged ear sensation, rash, vomiting or wheezing. She has tried acetaminophen and decongestant for the symptoms. The treatment provided no relief.   Generalized anxiety disorder Improved mood with zoloft 50mg .  She has used alprazolam 3x/week in PM. Increased anxiety related to work stress. Has continued counseling sessions  Maintain med dose F/up in 2months  Reviewed medical, surgical, and social history today  Medications: Outpatient Medications Prior to Visit  Medication Sig   ALPRAZolam (XANAX) 0.25 MG tablet Take 1 tablet (0.25 mg total) by mouth 2 (two) times daily as needed for anxiety.   [DISCONTINUED] sertraline (ZOLOFT) 50 MG tablet Take 1 tablet (50 mg total) by mouth daily.   No facility-administered medications prior to visit.   Reviewed past medical and social history.   ROS per HPI above      Objective:  BP 124/72 (BP Location: Left Arm, Patient Position: Sitting)   Pulse 77   Temp 98.4 F (36.9 C) (Temporal)   Ht 4\' 11"  (1.499 m)   Wt 172 lb 6.4 oz (78.2 kg)   LMP 07/14/2023 (Exact Date)   SpO2 100%   Breastfeeding No   BMI 34.82 kg/m      Physical Exam Vitals reviewed.  Constitutional:       General: She is not in acute distress. HENT:     Head: Normocephalic.     Jaw: There is normal jaw occlusion.     Right Ear: Tympanic membrane, ear canal and external ear normal.     Left Ear: Tympanic membrane, ear canal and external ear normal.     Nose: Congestion and rhinorrhea present. No nasal tenderness or mucosal edema.     Right Nostril: No occlusion.     Left Nostril: No occlusion.     Right Turbinates: Not enlarged, swollen or pale.     Left Turbinates: Not enlarged, swollen or pale.     Right Sinus: No maxillary sinus tenderness or frontal sinus tenderness.     Left Sinus: No maxillary sinus tenderness or frontal sinus tenderness.     Mouth/Throat:     Pharynx: Oropharynx is clear. Uvula midline. Posterior oropharyngeal erythema and postnasal drip present. No pharyngeal swelling, oropharyngeal exudate or uvula swelling.     Tonsils: No tonsillar exudate or tonsillar abscesses.  Eyes:     Extraocular Movements: Extraocular movements intact.     Conjunctiva/sclera: Conjunctivae normal.  Cardiovascular:     Rate and Rhythm: Normal rate and regular rhythm.     Pulses: Normal pulses.     Heart sounds: Normal heart sounds.  Pulmonary:     Effort: Pulmonary effort is normal.     Breath sounds: Normal breath sounds.  Musculoskeletal:     Cervical back: Normal range of motion and neck supple.  Lymphadenopathy:     Cervical: No cervical adenopathy.  Neurological:     Mental Status: She is alert and oriented to person, place, and time.  Psychiatric:        Mood and Affect: Mood normal.        Behavior: Behavior normal.        Thought Content: Thought content normal.     Results for orders placed or performed in visit on 08/04/23  POCT Influenza A/B  Result Value Ref Range   Influenza A, POC Positive (A) Negative   Influenza B, POC Negative Negative  POCT rapid strep A  Result Value Ref Range   Rapid Strep A Screen Negative Negative  POC COVID-19  Result Value Ref Range    SARS Coronavirus 2 Ag Negative Negative      Assessment & Plan:    Problem List Items Addressed This Visit     Bipolar disorder (HCC)   Relevant Medications   sertraline (ZOLOFT) 50 MG tablet   Generalized anxiety disorder - Primary   Improved mood with zoloft 50mg .  She has used alprazolam 3x/week in PM. Increased anxiety related to work stress. Has continued counseling sessions  Maintain med dose F/up in 2months      Relevant Medications   sertraline (ZOLOFT) 50 MG tablet   Other Visit Diagnoses       Influenza A       Relevant Medications   oseltamivir (TAMIFLU) 75 MG capsule   promethazine-dextromethorphan (PROMETHAZINE-DM) 6.25-15 MG/5ML syrup   Other Relevant Orders   POCT Influenza A/B (Completed)   POC COVID-19 (Completed)     Sore throat       Relevant Orders   POCT rapid strep A (Completed)      Return in about 2 months (around 10/02/2023) for depression and anxiety, CPE (fasting).     Alysia Penna, NP

## 2023-08-04 NOTE — Patient Instructions (Signed)
Maintain zoloft dose  Negative strep and COVID Encourage adequate oral hydration. Use over-the-counter  cold" medicine  such as Dayquil/Nyquil/Theraflu/Alkaseltzer for cough and sinus congestion. Use mucinex DM or Robitussin  or delsym for cough without sinus congestion  You can use plain "Tylenol" or "Advil" for fever, chills and achyness. Use cool mist humidifier at bedtime to help with nasal congestion and cough.  Cold/cough medications may have tylenol or ibuprofen or guaifenesin or dextromethophan in them, so be careful not to take beyond the recommended dose for each of these medications.

## 2023-10-02 ENCOUNTER — Encounter: Payer: 59 | Admitting: Nurse Practitioner

## 2023-10-27 ENCOUNTER — Encounter: Payer: Self-pay | Admitting: Nurse Practitioner

## 2023-10-30 ENCOUNTER — Encounter: Payer: Self-pay | Admitting: Nurse Practitioner

## 2023-10-30 DIAGNOSIS — F411 Generalized anxiety disorder: Secondary | ICD-10-CM

## 2023-10-30 DIAGNOSIS — F3131 Bipolar disorder, current episode depressed, mild: Secondary | ICD-10-CM

## 2023-10-31 ENCOUNTER — Other Ambulatory Visit: Payer: Self-pay

## 2023-10-31 MED ORDER — SERTRALINE HCL 50 MG PO TABS
50.0000 mg | ORAL_TABLET | Freq: Every day | ORAL | 1 refills | Status: DC
  Filled 2023-10-31: qty 90, 90d supply, fill #0
  Filled 2024-02-05: qty 90, 90d supply, fill #1

## 2024-02-13 ENCOUNTER — Other Ambulatory Visit: Payer: Self-pay

## 2024-03-04 ENCOUNTER — Other Ambulatory Visit: Payer: Self-pay

## 2024-03-04 DIAGNOSIS — Z8742 Personal history of other diseases of the female genital tract: Secondary | ICD-10-CM | POA: Diagnosis not present

## 2024-03-04 DIAGNOSIS — Z1151 Encounter for screening for human papillomavirus (HPV): Secondary | ICD-10-CM | POA: Diagnosis not present

## 2024-03-04 DIAGNOSIS — R87810 Cervical high risk human papillomavirus (HPV) DNA test positive: Secondary | ICD-10-CM | POA: Diagnosis not present

## 2024-03-04 DIAGNOSIS — Z6835 Body mass index (BMI) 35.0-35.9, adult: Secondary | ICD-10-CM | POA: Diagnosis not present

## 2024-03-04 DIAGNOSIS — F411 Generalized anxiety disorder: Secondary | ICD-10-CM | POA: Diagnosis not present

## 2024-03-04 DIAGNOSIS — Z01419 Encounter for gynecological examination (general) (routine) without abnormal findings: Secondary | ICD-10-CM | POA: Diagnosis not present

## 2024-03-04 DIAGNOSIS — Z124 Encounter for screening for malignant neoplasm of cervix: Secondary | ICD-10-CM | POA: Diagnosis not present

## 2024-03-04 DIAGNOSIS — Z309 Encounter for contraceptive management, unspecified: Secondary | ICD-10-CM | POA: Diagnosis not present

## 2024-03-04 LAB — RESULTS CONSOLE HPV: CHL HPV: POSITIVE

## 2024-03-04 LAB — HM PAP SMEAR
HPV 16: NEGATIVE
HPV 18 / 45: NEGATIVE
HPV, high-risk: POSITIVE

## 2024-03-04 MED ORDER — BUPROPION HCL ER (XL) 150 MG PO TB24
150.0000 mg | ORAL_TABLET | Freq: Every day | ORAL | 3 refills | Status: DC
  Filled 2024-03-04: qty 90, 90d supply, fill #0

## 2024-03-05 ENCOUNTER — Encounter: Payer: Self-pay | Admitting: Nurse Practitioner

## 2024-03-05 ENCOUNTER — Ambulatory Visit: Admitting: Nurse Practitioner

## 2024-03-05 VITALS — BP 118/76 | HR 78 | Temp 98.2°F | Ht 59.0 in | Wt 182.0 lb

## 2024-03-05 DIAGNOSIS — F411 Generalized anxiety disorder: Secondary | ICD-10-CM | POA: Diagnosis not present

## 2024-03-05 DIAGNOSIS — Z136 Encounter for screening for cardiovascular disorders: Secondary | ICD-10-CM

## 2024-03-05 DIAGNOSIS — E559 Vitamin D deficiency, unspecified: Secondary | ICD-10-CM

## 2024-03-05 DIAGNOSIS — E282 Polycystic ovarian syndrome: Secondary | ICD-10-CM | POA: Diagnosis not present

## 2024-03-05 DIAGNOSIS — Z13220 Encounter for screening for lipoid disorders: Secondary | ICD-10-CM | POA: Diagnosis not present

## 2024-03-05 DIAGNOSIS — R739 Hyperglycemia, unspecified: Secondary | ICD-10-CM

## 2024-03-05 DIAGNOSIS — Z23 Encounter for immunization: Secondary | ICD-10-CM

## 2024-03-05 DIAGNOSIS — Z6836 Body mass index (BMI) 36.0-36.9, adult: Secondary | ICD-10-CM

## 2024-03-05 DIAGNOSIS — F3175 Bipolar disorder, in partial remission, most recent episode depressed: Secondary | ICD-10-CM

## 2024-03-05 DIAGNOSIS — Z0001 Encounter for general adult medical examination with abnormal findings: Secondary | ICD-10-CM

## 2024-03-05 DIAGNOSIS — Z0000 Encounter for general adult medical examination without abnormal findings: Secondary | ICD-10-CM

## 2024-03-05 LAB — LIPID PANEL
Cholesterol: 146 mg/dL (ref 0–200)
HDL: 58.1 mg/dL (ref >39.00)
LDL Cholesterol: 76 mg/dL (ref 0–99)
NonHDL: 87.67
Total CHOL/HDL Ratio: 3
Triglycerides: 60 mg/dL (ref 0.0–149.0)
VLDL: 12 mg/dL (ref 0.0–40.0)

## 2024-03-05 LAB — HEMOGLOBIN A1C: Hgb A1c MFr Bld: 6 % (ref 4.6–6.5)

## 2024-03-05 LAB — VITAMIN D 25 HYDROXY (VIT D DEFICIENCY, FRACTURES): VITD: 15.32 ng/mL — ABNORMAL LOW (ref 30.00–100.00)

## 2024-03-05 NOTE — Patient Instructions (Signed)
 Weaning off zoloft  50mg : 1/2tab daily x 1weeks, then 1/2tab every other day x 1week, then stop. Go to lab Maintain Heart healthy diet and daily exercise.

## 2024-03-05 NOTE — Progress Notes (Signed)
 Complete physical exam  Patient: Cheyenne Lozano   DOB: 1991-01-02   33 y.o. Female  MRN: [id number] Visit Date: 03/05/2024  Subjective:    Chief Complaint  Patient presents with   Annual Exam    FASTING  Discuss medications Requesting PAP    Cheyenne Lozano is a 33 y.o. female who presents today for a complete physical exam. She reports consuming a low fat diet. Home exercise routine includes calisthenics and and weight training. She generally feels fairly well. She reports sleeping fairly well. She does have additional problems to discuss today.  Vision:Yes Dental:Yes STD Screen:No  BP Readings from Last 3 Encounters:  03/05/24 118/76  08/04/23 124/72  07/06/23 133/74   Wt Readings from Last 3 Encounters:  03/05/24 182 lb (82.6 kg)  08/04/23 172 lb 6.4 oz (78.2 kg)  07/06/23 172 lb 3.2 oz (78.1 kg)   Most recent fall risk assessment:    03/05/2024   10:07 AM  Fall Risk   Falls in the past year? 1  Number falls in past yr: 0  Injury with Fall? 0  Risk for fall due to : History of fall(s)  Follow up Falls evaluation completed     Depression screen:Yes - Depression Most recent depression screenings:    03/05/2024   10:07 AM 07/06/2023    2:17 PM  PHQ 2/9 Scores  PHQ - 2 Score 2 2  PHQ- 9 Score 6 7      03/05/2024   10:07 AM 07/06/2023    2:17 PM 04/04/2023    8:14 AM  Depression screen PHQ 2/9  Decreased Interest 1 1 2   Down, Depressed, Hopeless 1 1 2   PHQ - 2 Score 2 2 4   Altered sleeping 1 3 2   Tired, decreased energy 1 2 3   Change in appetite 1 0 3  Feeling bad or failure about yourself  0 0 0  Trouble concentrating 1 0 0  Moving slowly or fidgety/restless 0 0 0  Suicidal thoughts 0 0 0  PHQ-9 Score 6 7 12   Difficult doing work/chores Not difficult at all Not difficult at all Not difficult at all       03/05/2024   10:08 AM 07/06/2023    2:18 PM 04/04/2023    8:14 AM 03/08/2023    9:43 AM  GAD 7 : Generalized Anxiety Score  Nervous, Anxious,  on Edge 2 3 3 3   Control/stop worrying 1 3 3 3   Worry too much - different things 2 3 3 3   Trouble relaxing 3 3 3 3   Restless 0 0 0 2  Easily annoyed or irritable 3 3 2 3   Afraid - awful might happen 0 0 0 0  Total GAD 7 Score 11 15 14 17   Anxiety Difficulty Somewhat difficult Somewhat difficult Not difficult at all Somewhat difficult   HPI  Generalized anxiety disorder Stable mood, but concerned about weight gain despite lifestyle modifications. She wants to wean on zoloft  Has maintained appts with counselor once a week  I recommended switching med to lexapro or wellbutrtin. She opted to wean of zoloft  instead. Provided weaning instruction over the next 2weeks F/up in 9month  Hyperglycemia Repeat hgbA1c  Vitamin D  deficiency Repeat vit. D   Past Medical History:  Diagnosis Date   Bipolar disorder (HCC) 05/09/2019   Breast hypertrophy in female 02/26/2015   Generalized anxiety disorder 05/09/2019   History of gestational diabetes mellitus 03/03/2022   History of PCOS    History  of pre-eclampsia 03/03/2022   asa 12 wga   Type II diabetes mellitus (HCC)    actually I'm prediabetic; dr. put me on RX to get A1C #'s down (02/26/2015)   Past Surgical History:  Procedure Laterality Date   BREAST REDUCTION SURGERY Bilateral 02/26/2015   Procedure: BILATERAL BREAST REDUCTION  ;  Surgeon: Alm Sick, MD;  Location: [facility] OR;  Service: Plastics;  Laterality: Bilateral;   CESAREAN SECTION N/A 06/28/2017   Procedure: CESAREAN SECTION;  Surgeon: Kandis Devaughn Sayres, MD;  Location: [facility] BIRTHING SUITES;  Service: Obstetrics;  Laterality: N/A;   CESAREAN SECTION N/A 09/08/2022   Procedure: REPEAT CESAREAN SECTION EDC: 09-14-22  ALLERG: NKDA  PREVIOUS X1;  Surgeon: Marne Kelly Nest, MD;  Location: [facility] LD ORS;  Service: Obstetrics;  Laterality: N/A;   REDUCTION MAMMAPLASTY Bilateral 02/26/2015   WISDOM TOOTH EXTRACTION  ~ 2010   bottom 2   Social History   Socioeconomic History    Marital status: Single    Spouse name: Not on file   Number of children: 1   Years of education: Not on file   Highest education level: Bachelor's degree (e.g., BA, AB, BS)  Occupational History   Not on file  Tobacco Use   Smoking status: Never   Smokeless tobacco: Never  Vaping Use   Vaping status: Never Used  Substance and Sexual Activity   Alcohol use: Not Currently    Comment: social   Drug use: No   Sexual activity: Yes  Other Topics Concern   Not on file  Social History Narrative   Not on file   Social Drivers of Health   Financial Resource Strain: Low Risk  (04/03/2023)   Overall Financial Resource Strain (CARDIA)    Difficulty of Paying Living Expenses: Not hard at all  Food Insecurity: No Food Insecurity (04/03/2023)   Hunger Vital Sign    Worried About Running Out of Food in the Last Year: Never true    Ran Out of Food in the Last Year: Never true  Transportation Needs: No Transportation Needs (04/03/2023)   PRAPARE - Administrator, Civil Service (Medical): No    Lack of Transportation (Non-Medical): No  Physical Activity: Unknown (04/03/2023)   Exercise Vital Sign    Days of Exercise per Week: 0 days    Minutes of Exercise per Session: Not on file  Stress: Stress Concern Present (04/03/2023)   [employer] of Occupational Health - Occupational Stress Questionnaire    Feeling of Stress : Very much  Social Connections: Socially Integrated (04/03/2023)   Social Connection and Isolation Panel    Frequency of Communication with Friends and Family: Three times a week    Frequency of Social Gatherings with Friends and Family: Never    Attends Religious Services: More than 4 times per year    Active Member of [employer] or Organizations: Yes    Attends Banker Meetings: More than 4 times per year    Marital Status: Living with partner  Intimate Partner Violence: Not At Risk (09/08/2022)   Humiliation, Afraid, Rape, and Kick questionnaire     Fear of Current or Ex-Partner: No    Emotionally Abused: No    Physically Abused: No    Sexually Abused: No   Family Status  Relation Name Status   Mother  Alive   Father  Alive   MGM  Alive   MGF  Alive   PGM  Alive   PGF  Deceased  Cousin Maternal side Alive   Other Maternal great GM Alive  No partnership data on file   Family History  Problem Relation Age of Onset   Healthy Mother    Hypertension Father    Stroke Father    Schizophrenia Father    Diabetes Maternal Grandmother    Hypertension Maternal Grandmother    Diabetes Maternal Grandfather    Hypertension Maternal Grandfather    Heart attack Maternal Grandfather    Gout Maternal Grandfather    Hypertension Paternal Grandmother    Schizophrenia Cousin    Schizophrenia Other    No Known Allergies  Patient Care Team: Bram Hottel, Roselie Rockford, NP as PCP - General (Internal Medicine)   Medications: Outpatient Medications Prior to Visit  Medication Sig Note   Multiple Vitamin (MULTIVITAMIN PO) Take by mouth daily. Chew 2 gummies daily    sertraline  (ZOLOFT ) 50 MG tablet Take 1 tablet (50 mg total) by mouth daily.    buPROPion  (WELLBUTRIN  XL) 150 MG 24 hr tablet Take 1 tablet (150 mg total) by mouth daily. 03/05/2024: Has not started yet    [DISCONTINUED] ALPRAZolam  (XANAX ) 0.25 MG tablet Take 1 tablet (0.25 mg total) by mouth 2 (two) times daily as needed for anxiety. (Patient not taking: Reported on 03/05/2024)    [DISCONTINUED] promethazine -dextromethorphan (PROMETHAZINE -DM) 6.25-15 MG/5ML syrup Take 5 mLs by mouth 3 (three) times daily as needed for cough. (Patient not taking: Reported on 03/05/2024)    No facility-administered medications prior to visit.    Review of Systems  Constitutional:  Negative for activity change, appetite change and unexpected weight change.  Respiratory: Negative.    Cardiovascular: Negative.   Gastrointestinal: Negative.   Endocrine: Negative for cold intolerance and heat  intolerance.  Genitourinary: Negative.   Musculoskeletal: Negative.   Skin: Negative.   Neurological: Negative.   Hematological: Negative.   Psychiatric/Behavioral:  Positive for dysphoric mood. Negative for behavioral problems, decreased concentration, hallucinations, self-injury, sleep disturbance and suicidal ideas. The patient is nervous/anxious.         Objective:  BP 118/76 (BP Location: Left Arm, Patient Position: Sitting, Cuff Size: Large)   Pulse 78   Temp 98.2 F (36.8 C) (Oral)   Ht 4' 11 (1.499 m)   Wt 182 lb (82.6 kg)   LMP 02/27/2024   SpO2 95%   BMI 36.76 kg/m     Physical Exam Vitals and nursing note reviewed.  Constitutional:      General: She is not in acute distress. HENT:     Right Ear: Tympanic membrane, ear canal and external ear normal.     Left Ear: Tympanic membrane, ear canal and external ear normal.     Nose: Nose normal.  Eyes:     Extraocular Movements: Extraocular movements intact.     Conjunctiva/sclera: Conjunctivae normal.     Pupils: Pupils are equal, round, and reactive to light.  Neck:     Thyroid : No thyroid  mass, thyromegaly or thyroid  tenderness.  Cardiovascular:     Rate and Rhythm: Normal rate and regular rhythm.     Pulses: Normal pulses.     Heart sounds: Normal heart sounds.  Pulmonary:     Effort: Pulmonary effort is normal.     Breath sounds: Normal breath sounds.  Abdominal:     General: Bowel sounds are normal.     Palpations: Abdomen is soft.  Musculoskeletal:        General: Normal range of motion.     Cervical back: Normal range of  motion and neck supple.     Right lower leg: No edema.     Left lower leg: No edema.  Lymphadenopathy:     Cervical: No cervical adenopathy.  Skin:    General: Skin is warm and dry.  Neurological:     Mental Status: She is alert and oriented to person, place, and time.     Cranial Nerves: No cranial nerve deficit.  Psychiatric:        Mood and Affect: Mood normal.         Behavior: Behavior normal.        Thought Content: Thought content normal.      No results found for any visits on 03/05/24.    Assessment & Plan:    Routine Health Maintenance and Physical Exam  Immunization History  Administered Date(s) Administered   DTaP 05/10/1993, 07/06/1993, 09/02/1993, 06/07/1994, 10/30/1997   Dtap, Unspecified 05/10/1993, 07/06/1993, 09/02/1993, 06/07/1994, 10/30/1997   HIB (PRP-OMP) 05/10/1993, 07/06/1993, 09/02/1993, 04/12/1994   HIB, Unspecified 05/10/1993, 07/06/1993, 09/02/1993, 04/12/1994   HPV 9-valent 04/04/2023   HPV Quadrivalent 11/18/2005, 01/18/2006   Hep B, Unspecified 04/05/1993, 05/10/1993, 10/04/1993   Hepatitis A 11/18/2005   Hepatitis A, Ped/Adol-2 Dose 11/18/2005   Hepatitis B 04/05/1993, 05/01/1993, 10/04/1993   Hpv-Unspecified 11/18/2005, 01/18/2006   IPV 05/10/1993, 07/06/1993, 09/02/1993, 10/30/1997   Influenza, Seasonal, Injecte, Preservative Fre 03/26/2010, 04/04/2023, 03/05/2024   Influenza,inj,Quad PF,6+ Mos 03/02/2017, 04/05/2019   Influenza-Unspecified 04/13/2022   MMR 04/12/1994, 10/30/1997   Meningococcal Conjugate 11/18/2005   PFIZER(Purple Top)SARS-COV-2 Vaccination 06/29/2019, 08/29/2019   PPD Test 05/07/2019   Polio, Unspecified 05/10/1993, 07/06/1993, 09/02/1993, 10/30/1997   Td 03/26/2010   Tdap 03/26/2010, 04/13/2017, 04/07/2020, 06/23/2022    Health Maintenance  Topic Date Due   Cervical Cancer Screening (HPV/Pap Cotest)  06/23/2023   COVID-19 Vaccine (3 - 2025-26 season) 03/21/2024 (Originally 02/19/2024)   DTaP/Tdap/Td (11 - Td or Tdap) 06/23/2032   Influenza Vaccine  Completed   HPV VACCINES  Completed   Hepatitis C Screening  Completed   HIV Screening  Completed   Pneumococcal Vaccine  Aged Out   Meningococcal B Vaccine  Aged Out   Hepatitis B Vaccines 19-59 Average Risk  Discontinued    Discussed health benefits of physical activity, and encouraged her to engage in regular exercise appropriate for  her age and condition.  Problem List Items Addressed This Visit     Bipolar disorder (HCC)   Generalized anxiety disorder   Stable mood, but concerned about weight gain despite lifestyle modifications. She wants to wean on zoloft  Has maintained appts with counselor once a week  I recommended switching med to lexapro or wellbutrtin. She opted to wean of zoloft  instead. Provided weaning instruction over the next 2weeks F/up in 11month      Hyperglycemia   Repeat hgbA1c      Relevant Orders   Hemoglobin A1c   Morbid obesity (HCC)   Relevant Orders   Hemoglobin A1c   Lipid panel   Polycystic ovary syndrome   Relevant Orders   Hemoglobin A1c   Vitamin D  deficiency   Repeat vit. D      Relevant Orders   VITAMIN D  25 Hydroxy (Vit-D Deficiency, Fractures)   Other Visit Diagnoses       Encounter for preventative adult health care exam with abnormal findings    -  Primary   Relevant Orders   Comprehensive metabolic panel with GFR     Encounter for lipid screening for cardiovascular disease  Relevant Orders   Lipid panel     Immunization due       Relevant Orders   Flu vaccine trivalent PF, 6mos and older(Flulaval,Afluria,Fluarix,Fluzone) (Completed)      Return in about 3 weeks (around 03/26/2024) for depression and anxiety.     Roselie Mood, NP

## 2024-03-05 NOTE — Assessment & Plan Note (Signed)
 Repeat vit D

## 2024-03-05 NOTE — Assessment & Plan Note (Signed)
 Repeat hgbA1c

## 2024-03-05 NOTE — Assessment & Plan Note (Addendum)
 Stable mood, but concerned about weight gain despite lifestyle modifications. She wants to wean on zoloft  Has maintained appts with counselor once a week  I recommended switching med to lexapro or wellbutrtin. She opted to wean of zoloft  instead. Provided weaning instruction over the next 2weeks F/up in 38month

## 2024-03-06 ENCOUNTER — Other Ambulatory Visit: Payer: Self-pay

## 2024-03-06 ENCOUNTER — Ambulatory Visit: Payer: Self-pay | Admitting: Nurse Practitioner

## 2024-03-06 ENCOUNTER — Other Ambulatory Visit (HOSPITAL_COMMUNITY): Payer: Self-pay

## 2024-03-06 DIAGNOSIS — E559 Vitamin D deficiency, unspecified: Secondary | ICD-10-CM

## 2024-03-06 MED ORDER — VITAMIN D (ERGOCALCIFEROL) 1.25 MG (50000 UNIT) PO CAPS
50000.0000 [IU] | ORAL_CAPSULE | ORAL | 0 refills | Status: AC
  Filled 2024-03-06 (×2): qty 12, 84d supply, fill #0

## 2024-03-07 ENCOUNTER — Encounter: Payer: Self-pay | Admitting: Nurse Practitioner

## 2024-03-07 ENCOUNTER — Other Ambulatory Visit: Payer: Self-pay

## 2024-03-09 LAB — COMPREHENSIVE METABOLIC PANEL WITH GFR
ALT: 17 U/L (ref 0–35)
AST: 19 U/L (ref 0–37)
Albumin: 4.1 g/dL (ref 3.5–5.2)
Alkaline Phosphatase: 64 U/L (ref 39–117)
BUN: 12 mg/dL (ref 6–23)
CO2: 26 meq/L (ref 19–32)
Calcium: 9.2 mg/dL (ref 8.4–10.5)
Chloride: 108 meq/L (ref 96–112)
Creatinine, Ser: 0.67 mg/dL (ref 0.40–1.20)
GFR: 116.81 mL/min (ref >60.00)
Glucose, Bld: 83 mg/dL (ref 70–99)
Potassium: 4.1 meq/L (ref 3.5–5.1)
Sodium: 137 meq/L (ref 135–145)
Total Bilirubin: 0.3 mg/dL (ref 0.2–1.2)
Total Protein: 7 g/dL (ref 6.0–8.3)

## 2024-03-12 ENCOUNTER — Ambulatory Visit: Payer: Self-pay | Admitting: Nurse Practitioner

## 2024-03-13 ENCOUNTER — Other Ambulatory Visit: Payer: Self-pay

## 2024-03-29 ENCOUNTER — Encounter: Payer: Self-pay | Admitting: Nurse Practitioner

## 2024-03-29 ENCOUNTER — Ambulatory Visit: Admitting: Nurse Practitioner

## 2024-03-29 VITALS — BP 122/78 | HR 90 | Ht 59.0 in | Wt 181.2 lb

## 2024-03-29 DIAGNOSIS — F411 Generalized anxiety disorder: Secondary | ICD-10-CM | POA: Diagnosis not present

## 2024-03-29 DIAGNOSIS — F3132 Bipolar disorder, current episode depressed, moderate: Secondary | ICD-10-CM | POA: Diagnosis not present

## 2024-03-29 MED ORDER — LAMOTRIGINE 25 MG PO TABS: 25.0000 mg | ORAL_TABLET | Freq: Every day | ORAL | 5 refills | Status: DC

## 2024-03-29 MED ORDER — SERTRALINE HCL 25 MG PO TABS: 25.0000 mg | ORAL_TABLET | Freq: Every day | ORAL | 5 refills | Status: AC

## 2024-03-29 NOTE — Patient Instructions (Signed)
 Resume zoloft  25mg  and lamictal  25mg  Maintain appointment with psychologist every other week if possible Use condoms at all times to prevent pregnancy F/up with me in 83month

## 2024-03-29 NOTE — Progress Notes (Signed)
 Established Patient Visit  Patient: Cheyenne Lozano   DOB: [birth year]   33 y.o. Female  MRN: [id number] Visit Date: 03/29/2024  Subjective:    Chief Complaint  Patient presents with   Follow-up    3 week follow up for Anxiety and depression    HPI Bipolar disorder (HCC) Reports worsening mood swings with discontinuation of zoloft . No SI/HI/hallucinations Have maintain biweekly appts with psychologist.  She agreed to resume zoloft  and lamictal . Advised about the need to prevent pregnancy with use of lamictal . She opted to use condoms. Sent Prescriptions Advised to maintain appts with psychologist F/up in 77month  Generalized anxiety disorder Reports worsening mood swings  and increase anxiety, lack of sleep with discontinuation of zoloft . No SI/HI/hallucinations Have maintain biweekly appts with psychologist.  She agreed to resume zoloft  and lamictal . Advised about the need to prevent pregnancy with use of lamictal . She opted to use condoms. Sent Prescriptions Advised to maintain appts with psychologist F/up in 77month     03/29/2024   10:34 AM 03/05/2024   10:07 AM 07/06/2023    2:17 PM  Depression screen PHQ 2/9  Decreased Interest 2 1 1   Down, Depressed, Hopeless 1 1 1   PHQ - 2 Score 3 2 2   Altered sleeping 3 1 3   Tired, decreased energy 3 1 2   Change in appetite 3 1 0  Feeling bad or failure about yourself  0 0 0  Trouble concentrating 3 1 0  Moving slowly or fidgety/restless 3 0 0  Suicidal thoughts 0 0 0  PHQ-9 Score 18 6 7   Difficult doing work/chores Not difficult at all Not difficult at all Not difficult at all       03/29/2024   10:34 AM 03/05/2024   10:08 AM 07/06/2023    2:18 PM 04/04/2023    8:14 AM  GAD 7 : Generalized Anxiety Score  Nervous, Anxious, on Edge 3 2 3 3   Control/stop worrying 3 1 3 3   Worry too much - different things 3 2 3 3   Trouble relaxing 3 3 3 3   Restless 2 0 0 0  Easily annoyed or irritable 3 3 3 2   Afraid  - awful might happen 0 0 0 0  Total GAD 7 Score 17 11 15 14   Anxiety Difficulty Somewhat difficult Somewhat difficult Somewhat difficult Not difficult at all     Reviewed medical, surgical, and social history today  Medications: Outpatient Medications Prior to Visit  Medication Sig   Multiple Vitamin (MULTIVITAMIN PO) Take by mouth daily. Chew 2 gummies daily   Vitamin D , Ergocalciferol , (DRISDOL ) 1.25 MG (50000 UNIT) CAPS capsule Take 1 capsule (50,000 Units total) by mouth every 7 (seven) days.   [DISCONTINUED] buPROPion  (WELLBUTRIN  XL) 150 MG 24 hr tablet Take 1 tablet (150 mg total) by mouth daily.   [DISCONTINUED] sertraline  (ZOLOFT ) 50 MG tablet Take 1 tablet (50 mg total) by mouth daily.   No facility-administered medications prior to visit.   Reviewed past medical and social history.   ROS per HPI above      Objective:  BP 122/78 (BP Location: Left Arm, Patient Position: Sitting, Cuff Size: Large)   Pulse 90   Ht 4' 11 (1.499 m)   Wt 181 lb 3.2 oz (82.2 kg)   LMP 03/26/2024 (Exact Date)   SpO2 99%   Breastfeeding No   BMI 36.60 kg/m      Physical  Exam Psychiatric:        Attention and Perception: Attention normal.        Mood and Affect: Mood and affect normal.        Speech: Speech normal.        Behavior: Behavior is cooperative.        Thought Content: Thought content normal.        Cognition and Memory: Cognition and memory normal.        Judgment: Judgment normal.     No results found for any visits on 03/29/24.    Assessment & Plan:    Problem List Items Addressed This Visit     Bipolar disorder (HCC)   Reports worsening mood swings with discontinuation of zoloft . No SI/HI/hallucinations Have maintain biweekly appts with psychologist.  She agreed to resume zoloft  and lamictal . Advised about the need to prevent pregnancy with use of lamictal . She opted to use condoms. Sent Prescriptions Advised to maintain appts with psychologist F/up in  77month      Relevant Medications   lamoTRIgine  (LAMICTAL ) 25 MG tablet   Generalized anxiety disorder - Primary   Reports worsening mood swings  and increase anxiety, lack of sleep with discontinuation of zoloft . No SI/HI/hallucinations Have maintain biweekly appts with psychologist.  She agreed to resume zoloft  and lamictal . Advised about the need to prevent pregnancy with use of lamictal . She opted to use condoms. Sent Prescriptions Advised to maintain appts with psychologist F/up in 77month      Relevant Medications   sertraline  (ZOLOFT ) 25 MG tablet   Return in about 4 weeks (around 04/26/2024) for depression and anxiety.     Roselie Mood, NP

## 2024-03-29 NOTE — Assessment & Plan Note (Signed)
 Reports worsening mood swings with discontinuation of zoloft . No SI/HI/hallucinations Have maintain biweekly appts with psychologist.  She agreed to resume zoloft  and lamictal . Advised about the need to prevent pregnancy with use of lamictal . She opted to use condoms. Sent Prescriptions Advised to maintain appts with psychologist F/up in 72month

## 2024-03-29 NOTE — Assessment & Plan Note (Signed)
 Reports worsening mood swings  and increase anxiety, lack of sleep with discontinuation of zoloft . No SI/HI/hallucinations Have maintain biweekly appts with psychologist.  She agreed to resume zoloft  and lamictal . Advised about the need to prevent pregnancy with use of lamictal . She opted to use condoms. Sent Prescriptions Advised to maintain appts with psychologist F/up in 29month

## 2024-04-19 ENCOUNTER — Other Ambulatory Visit: Payer: Self-pay | Admitting: Medical Genetics

## 2024-04-26 ENCOUNTER — Encounter: Payer: Self-pay | Admitting: Nurse Practitioner

## 2024-04-26 ENCOUNTER — Ambulatory Visit (INDEPENDENT_AMBULATORY_CARE_PROVIDER_SITE_OTHER): Admitting: Nurse Practitioner

## 2024-04-26 ENCOUNTER — Other Ambulatory Visit (HOSPITAL_COMMUNITY)

## 2024-04-26 VITALS — BP 126/78 | HR 77 | Temp 98.0°F | Ht 59.0 in | Wt 181.0 lb

## 2024-04-26 DIAGNOSIS — F411 Generalized anxiety disorder: Secondary | ICD-10-CM

## 2024-04-26 DIAGNOSIS — F3341 Major depressive disorder, recurrent, in partial remission: Secondary | ICD-10-CM | POA: Diagnosis not present

## 2024-04-26 DIAGNOSIS — Z006 Encounter for examination for normal comparison and control in clinical research program: Secondary | ICD-10-CM | POA: Diagnosis not present

## 2024-04-26 NOTE — Assessment & Plan Note (Signed)
 Reports improved and stable mood with current meds. Denies any adverse effects Has maintained appts with therapist 2x/week  Maintain med doses F/up in 3months

## 2024-04-26 NOTE — Progress Notes (Signed)
 Established Patient Visit  Patient: Cheyenne Lozano   DOB: 10/05/1990   33 y.o. Female  MRN: [id number] Visit Date: 04/26/2024  Subjective:    Chief Complaint  Patient presents with   Follow-up    4 week follow up for Anxiety and Depression    HPI Generalized anxiety disorder Reports improved and stable mood with current meds. Denies any adverse effects Has maintained appts with therapist 2x/week  Maintain med doses F/up in 3months     04/26/2024    8:58 AM 03/29/2024   10:34 AM 03/05/2024   10:07 AM  Depression screen PHQ 2/9  Decreased Interest 0 2 1  Down, Depressed, Hopeless 0 1 1  PHQ - 2 Score 0 3 2  Altered sleeping 2 3 1   Tired, decreased energy 0 3 1  Change in appetite 0 3 1  Feeling bad or failure about yourself  0 0 0  Trouble concentrating 2 3 1   Moving slowly or fidgety/restless 0 3 0  Suicidal thoughts 0 0 0  PHQ-9 Score 4 18  6    Difficult doing work/chores Not difficult at all Not difficult at all Not difficult at all     Data saved with a previous flowsheet row definition       04/26/2024    8:59 AM 03/29/2024   10:34 AM 03/05/2024   10:08 AM 07/06/2023    2:18 PM  GAD 7 : Generalized Anxiety Score  Nervous, Anxious, on Edge 2 3 2 3   Control/stop worrying 2 3 1 3   Worry too much - different things 2 3 2 3   Trouble relaxing 2 3 3 3   Restless 0 2 0 0  Easily annoyed or irritable 1 3 3 3   Afraid - awful might happen 0 0 0 0  Total GAD 7 Score 9 17 11 15   Anxiety Difficulty Not difficult at all Somewhat difficult Somewhat difficult Somewhat difficult     Reviewed medical, surgical, and social history today  Medications: Outpatient Medications Prior to Visit  Medication Sig   lamoTRIgine  (LAMICTAL ) 25 MG tablet Take 1 tablet (25 mg total) by mouth daily.   Multiple Vitamin (MULTIVITAMIN PO) Take by mouth daily. Chew 2 gummies daily   sertraline  (ZOLOFT ) 25 MG tablet Take 1 tablet (25 mg total) by mouth daily.   Vitamin D ,  Ergocalciferol , (DRISDOL ) 1.25 MG (50000 UNIT) CAPS capsule Take 1 capsule (50,000 Units total) by mouth every 7 (seven) days.   No facility-administered medications prior to visit.   Reviewed past medical and social history.   ROS per HPI above      Objective:  BP 126/78 (BP Location: Left Arm, Patient Position: Sitting, Cuff Size: Large)   Pulse 77   Temp 98 F (36.7 C) (Oral)   Ht 4' 11 (1.499 m)   Wt 181 lb (82.1 kg)   LMP 04/26/2024 (Exact Date)   SpO2 100%   Breastfeeding No   BMI 36.56 kg/m      Physical Exam  No results found for any visits on 04/26/24.    Assessment & Plan:    Problem List Items Addressed This Visit     Generalized anxiety disorder - Primary   Reports improved and stable mood with current meds. Denies any adverse effects Has maintained appts with therapist 2x/week  Maintain med doses F/up in 3months      Other Visit Diagnoses  Research study patient       Relevant Orders   [employer] Molecular Screen - Blood      Return in about 3 months (around 07/27/2024) for depression and anxiety.     Roselie Mood, NP

## 2024-04-26 NOTE — Patient Instructions (Signed)
 Maintain med doses and therapy.

## 2024-05-07 ENCOUNTER — Ambulatory Visit (HOSPITAL_BASED_OUTPATIENT_CLINIC_OR_DEPARTMENT_OTHER)
Admission: RE | Admit: 2024-05-07 | Discharge: 2024-05-07 | Disposition: A | Discharge status: Home / Self Care | Source: Ambulatory Visit | Attending: Sports Medicine | Admitting: Sports Medicine

## 2024-05-07 ENCOUNTER — Encounter: Payer: Self-pay | Admitting: Sports Medicine

## 2024-05-07 ENCOUNTER — Ambulatory Visit: Payer: Self-pay

## 2024-05-07 ENCOUNTER — Ambulatory Visit (INDEPENDENT_AMBULATORY_CARE_PROVIDER_SITE_OTHER): Admitting: Sports Medicine

## 2024-05-07 ENCOUNTER — Encounter: Payer: Self-pay | Admitting: Nurse Practitioner

## 2024-05-07 VITALS — BP 105/74 | HR 78 | Temp 98.7°F | Wt 180.0 lb

## 2024-05-07 DIAGNOSIS — M25511 Pain in right shoulder: Secondary | ICD-10-CM | POA: Diagnosis not present

## 2024-05-07 DIAGNOSIS — R0789 Other chest pain: Secondary | ICD-10-CM | POA: Insufficient documentation

## 2024-05-07 DIAGNOSIS — R0781 Pleurodynia: Secondary | ICD-10-CM | POA: Diagnosis not present

## 2024-05-07 LAB — GENECONNECT MOLECULAR SCREEN: Genetic Analysis Overall Interpretation: NEGATIVE

## 2024-05-07 MED ORDER — BACLOFEN 5 MG PO TABS: 5.0000 mg | ORAL_TABLET | Freq: Every day | ORAL | 0 refills | Status: AC

## 2024-05-07 MED ORDER — MELOXICAM 15 MG PO TABS
15.0000 mg | ORAL_TABLET | Freq: Every day | ORAL | 0 refills | Status: AC
Start: 2024-05-07 — End: ?

## 2024-05-07 NOTE — Telephone Encounter (Signed)
 FYI Only or Action Required?: FYI only for provider: appointment scheduled on 05/07/24.  Patient was last seen in primary care on 04/26/2024 by Nche, Roselie Rockford, NP.  Called Nurse Triage reporting Fall.  Symptoms began yesterday.  Interventions attempted: OTC medications: Ibuprofen , Tylenol , Rest, hydration, or home remedies, and Ice/heat application.  Symptoms are: stable.  Triage Disposition: See PCP When Office is Open (Within 3 Days)  Patient/caregiver understands and will follow disposition?: Yes Reason for Disposition  MILD weakness (e.g., does not interfere with ability to work, go to school, normal activities)  (Exception: Mild weakness is a chronic symptom.)  Answer Assessment - Initial Assessment Questions Taken Ibuprofen  and tylenol , alternating heat and ice. Pain exacerbated when taking a deep breath. No available appointments at PCP office, called [facility] at [facility] and spoke with Dana for permission to schedule patient within the hour with Dr. Sherlynn, as patient could not make the afternoon appointment.   1. MECHANISM: How did the fall happen?     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 DOMESTIC VIOLENCE AND ELDER ABUSE SCREENING: Did you fall because someone pushed you or tried to hurt you? If Yes, ask: Are you safe now?     N/A  3. ONSET: When did the fall happen? (e.g., minutes, hours, or days ago)     Last night  4. LOCATION: What part of the body hit the ground? (e.g., back, buttocks, head, hips, knees, hands, head, stomach)     Right scapula , ribs  5. INJURY: Did you hurt (injure) yourself when you fell? If Yes, ask: What did you injure? Tell me more about this? (e.g., body area; type of injury; pain severity)     Yes, ribs and scapula  6. PAIN: Is there any pain? If Yes, ask: How bad is the pain? (e.g., Scale 0-10; or none, mild,      5/10 currently  7. SIZE: For cuts, bruises, or swelling,  ask: How large is it? (e.g., inches or centimeters)      About the size of patients hand  8.  OTHER SYMPTOMS: Do you have any other symptoms? (e.g., dizziness, fever, weakness; new-onset or worsening).      Denies  Protocols used: Falls and [facility]  Copied from CRM [id number]. Topic: Clinical - Red Word Triage >> May 07, 2024 10:28 AM Laymon HERO wrote: Red Word that prompted transfer to Nurse Triage: fell hard on the rim of bathtub hurt back and right side. took 600mg  of ibuprofen  and 1,000mg  of acetaminophen  and it did nothing for the pain

## 2024-05-07 NOTE — Progress Notes (Signed)
 Careteam: Patient Care Team: Nche, Roselie Rockford, NP as PCP - General (Internal Medicine)  No Known Allergies  Chief Complaint  Patient presents with   Fall    Pt had a fall last night and hurt her side and ribs.    Discussed the use of AI scribe software for clinical note transcription with the patient, who gave verbal consent to proceed.  History of Present Illness    Gene A Weyrauch is a 33 year old female who presents with acute back pain following a fall.  She experienced acute back pain after slipping and falling in her bathroom last night around 7:30 PM. The pain is constant, located in her back, and does not radiate to her legs.  She attempted to manage the pain with over-the-counter medications, taking 600 mg of ibuprofen  at 5:30 AM and 1000 mg of Tylenol  at 7:30 AM, but these provided no relief. She is currently taking meloxicam, Zoloft , and vitamin D .  No tingling or numbness in her legs, fever, cough, congestion, chest pain, or urinary symptoms.   Review of Systems:  Review of Systems  Constitutional:  Negative for chills and fever.  HENT:  Negative for congestion and sore throat.   Respiratory:  Negative for cough, sputum production and shortness of breath.   Cardiovascular:  Negative for chest pain, palpitations and leg swelling.  Gastrointestinal:  Negative for abdominal pain, heartburn and nausea.  Genitourinary:  Negative for dysuria, frequency and hematuria.  Musculoskeletal:  Negative for falls and myalgias.  Neurological:  Negative for dizziness, sensory change and focal weakness.   Negative unless indicated in HPI.   Patient Active Problem List   Diagnosis Date Noted   Recurrent major depressive disorder, in partial remission 04/26/2024   Morbid obesity (HCC) 03/05/2024   Vitamin D  deficiency 03/09/2023   History of pre-eclampsia 03/03/2022   History of gestational diabetes mellitus 03/03/2022   History of cesarean section 03/03/2022   Anemia  03/03/2022   Panic disorder 08/07/2019   Generalized anxiety disorder 05/09/2019   Bipolar disorder (HCC) 05/09/2019   Insomnia 04/26/2019   Hyperglycemia 04/07/2019   Upper back pain, chronic 04/07/2019   Polycystic ovary syndrome 09/12/2018   Breast hypertrophy in female 02/26/2015   Past Medical History:  Diagnosis Date   Bipolar disorder (HCC) 05/09/2019   Breast hypertrophy in female 02/26/2015   Generalized anxiety disorder 05/09/2019   History of gestational diabetes mellitus 03/03/2022   History of PCOS    History of pre-eclampsia 03/03/2022   asa 12 wga   Type II diabetes mellitus (HCC)    actually I'm prediabetic; dr. put me on RX to get A1C #'s down (02/26/2015)   Past Surgical History:  Procedure Laterality Date   BREAST REDUCTION SURGERY Bilateral 02/26/2015   Procedure: BILATERAL BREAST REDUCTION  ;  Surgeon: Alm Sick, MD;  Location: [facility] OR;  Service: Plastics;  Laterality: Bilateral;   CESAREAN SECTION N/A 06/28/2017   Procedure: CESAREAN SECTION;  Surgeon: Kandis Devaughn Sayres, MD;  Location: [facility] BIRTHING SU[facility];  Service: Obstetrics;  Laterality: N/A;   CESAREAN SECTION N/A 09/08/2022   Procedure: REPEAT CESAREAN SECTION EDC: 09-14-22  ALLERG: NKDA  PREVIOUS X1;  Surgeon: Marne Kelly Nest, MD;  Location: [facility] LD ORS;  Service: Obstetrics;  Laterality: N/A;   REDUCTION MAMMAPLASTY Bilateral 02/26/2015   WISDOM TOOTH EXTRACTION  ~ 2010   bottom 2   Social History   Tobacco Use   Smoking status: Never   Smokeless tobacco: Never  Vaping  Use   Vaping status: Never Used  Substance Use Topics   Alcohol use: Not Currently    Comment: social   Drug use: No   Family History  Problem Relation Age of Onset   Healthy Mother    Hypertension Father    Stroke Father    Schizophrenia Father    Diabetes Maternal Grandmother    Hypertension Maternal Grandmother    Diabetes Maternal Grandfather    Hypertension Maternal Grandfather    Heart attack Maternal  Grandfather    Gout Maternal Grandfather    Hypertension Paternal Grandmother    Schizophrenia Cousin    Schizophrenia Other    No Known Allergies  Medications: Patient's Medications  New Prescriptions   No medications on file  Previous Medications   LAMOTRIGINE  (LAMICTAL ) 25 MG TABLET    Take 1 tablet (25 mg total) by mouth daily.   MULTIPLE VITAMIN (MULTIVITAMIN PO)    Take by mouth daily. Chew 2 gummies daily   SERTRALINE  (ZOLOFT ) 25 MG TABLET    Take 1 tablet (25 mg total) by mouth daily.   VITAMIN D , ERGOCALCIFEROL , (DRISDOL ) 1.25 MG (50000 UNIT) CAPS CAPSULE    Take 1 capsule (50,000 Units total) by mouth every 7 (seven) days.  Modified Medications   No medications on file  Discontinued Medications   No medications on file    Physical Exam: There were no vitals filed for this visit. There is no height or weight on file to calculate BMI. BP Readings from Last 3 Encounters:  04/26/24 126/78  03/29/24 122/78  03/05/24 118/76   Wt Readings from Last 3 Encounters:  04/26/24 181 lb (82.1 kg)  03/29/24 181 lb 3.2 oz (82.2 kg)  03/05/24 182 lb (82.6 kg)    Physical Exam Constitutional:      Appearance: Normal appearance.  HENT:     Head: Normocephalic and atraumatic.  Cardiovascular:     Rate and Rhythm: Normal rate and regular rhythm.  Pulmonary:     Effort: Pulmonary effort is normal. No respiratory distress.     Breath sounds: Normal breath sounds. No wheezing.  Abdominal:     General: Bowel sounds are normal. There is no distension.     Tenderness: There is no abdominal tenderness. There is no guarding or rebound.     Comments:    Musculoskeletal:        General: No swelling or tenderness.     Comments: Rt infrascapular area- tender to touch  No external bruise   Neurological:     Mental Status: She is alert. Mental status is at baseline.     Sensory: No sensory deficit.     Motor: No weakness.     LMP nov 8  Labs reviewed: Basic Metabolic  Panel: Recent Labs    03/05/24 1049  NA 137  K 4.1  CL 108  CO2 26  GLUCOSE 83  BUN 12  CREATININE 0.67  CALCIUM 9.2   Liver Function Tests: Recent Labs    03/05/24 1049  AST 19  ALT 17  ALKPHOS 64  BILITOT 0.3  PROT 7.0  ALBUMIN  4.1   No results for input(s): LIPASE, AMYLASE in the last 8760 hours. No results for input(s): AMMONIA in the last 8760 hours. CBC: No results for input(s): WBC, NEUTROABS, HGB, HCT, MCV, PLT in the last 8760 hours. Lipid Panel: Recent Labs    03/05/24 1049  CHOL 146  HDL 58.10  LDLCALC 76  TRIG 60.0  CHOLHDL 3  TSH: No results for input(s): TSH in the last 8760 hours. A1C: Lab Results  Component Value Date   HGBA1C 6.0 03/05/2024    Assessment & Plan Rib pain on right side S/p fall  C/o constant pain, worse with movements Denies sob Will get x ray  Will prescribe meloxicam, baclofen  Instructed patient to use heating pad and lidocaine  patch Orders:   DG Ribs Unilateral Right; Future   DG Scapula Right; Future

## 2024-05-07 NOTE — Telephone Encounter (Signed)
 Noted patient has appointment scheduled for today at [facility]

## 2024-05-08 ENCOUNTER — Telehealth: Payer: Self-pay

## 2024-05-08 NOTE — Telephone Encounter (Signed)
 Copied from CRM #[id number]. Topic: General - Other >> May 08, 2024  8:44 AM Thersia BROCKS wrote: Reason for CRM: Patient called in regarding appointment for yesterday was told not to left anything over 30 lbs and needs a note stating that to her employer  Prefer for it to be sent through mychart

## 2024-05-08 NOTE — Telephone Encounter (Signed)
 Letter sent to pt via mychart. Pt advised

## 2024-05-19 ENCOUNTER — Ambulatory Visit: Payer: Self-pay | Admitting: Sports Medicine

## 2024-05-20 NOTE — Telephone Encounter (Signed)
 Results sent via my chart

## 2024-05-22 ENCOUNTER — Telehealth: Payer: Self-pay

## 2024-05-22 NOTE — Telephone Encounter (Unsigned)
 Copied from CRM #[id number]. Topic: Clinical - Medical Advice >> May 22, 2024 11:23 AM Dedra B wrote: Reason for CRM: Pt said she has been having severe panic attacks for the past two weeks and they're getting worse. I offered to transfer to NT, but pt is at work and only had a few minutes to spare. She would prefer a call from [place] or her nurse. Pls call pt.

## 2024-05-23 NOTE — Telephone Encounter (Signed)
 Called patient to get scheduled for an appointment with Roselie or another provider. Patient is scheduled with Dr. Sebastian 12/0/05 at 8:40 AM. Explained to patient that someone be in contact with her if we have to reschedule and/or cancel her appointment due to the implement weather that is due to occur tomorrow same day as appointment. Patient verbalized she understands and is okay with that if it occurs.

## 2024-05-24 ENCOUNTER — Other Ambulatory Visit: Payer: Self-pay

## 2024-05-24 ENCOUNTER — Ambulatory Visit: Admitting: Family Medicine

## 2024-05-24 VITALS — BP 107/71 | HR 81 | Temp 98.6°F | Ht 59.0 in | Wt 179.0 lb

## 2024-05-24 DIAGNOSIS — F410 Panic disorder [episodic paroxysmal anxiety] without agoraphobia: Secondary | ICD-10-CM | POA: Diagnosis not present

## 2024-05-24 DIAGNOSIS — F4389 Other reactions to severe stress: Secondary | ICD-10-CM

## 2024-05-24 DIAGNOSIS — F3132 Bipolar disorder, current episode depressed, moderate: Secondary | ICD-10-CM | POA: Diagnosis not present

## 2024-05-24 MED ORDER — QUETIAPINE FUMARATE 50 MG PO TABS
50.0000 mg | ORAL_TABLET | Freq: Every day | ORAL | 3 refills | Status: AC
  Filled 2024-05-24: qty 90, 90d supply, fill #0

## 2024-05-24 MED ORDER — LAMOTRIGINE 100 MG PO TABS
ORAL_TABLET | ORAL | 3 refills | Status: AC
  Filled 2024-05-24: qty 180, 90d supply, fill #0

## 2024-05-24 NOTE — Patient Instructions (Addendum)
 It was very nice to see you today!  VISIT SUMMARY: Today, we discussed your worsening anxiety and panic attacks, which are likely related to your bipolar disorder and situational stressors. We adjusted your medications and provided additional resources to help manage your symptoms.  YOUR PLAN: BIPOLAR DISORDER WITH ANXIETY AND PANIC ATTACKS: Your anxiety and panic attacks have worsened, likely due to your bipolar disorder and stress related to your son's condition and your job. -Increase lamotrigine  dosage to 200 mg, gradually increasing to monitor for rash. -Add quetiapine  50 mg at night to help with anxiety and sleep. -Continue counseling sessions twice a week. -Provided information on family support network for additional resources.  INSOMNIA: Your difficulty sleeping is likely due to anxiety and stress from your personal and professional life. -Take quetiapine  50 mg at night to help with sleep.  Return in about 4 weeks (around 06/21/2024).   Take care, Arvella Hummer, MD, MS   PLEASE NOTE:  If you had any lab tests, please let us  know if you have not heard back within a few days. You may see your results on mychart before we have a chance to review them but we will give you a call once they are reviewed by us .   If we ordered any referrals today, please let us  know if you have not heard from their office within the next week.   If you had any urgent prescriptions sent in today, please check with the pharmacy within an hour of our visit to make sure the prescription was transmitted appropriately.   Please try these tips to maintain a healthy lifestyle:  Eat at least 3 REAL meals and 1-2 snacks per day.  Aim for no more than 5 hours between eating.  If you eat breakfast, please do so within one hour of getting up.   Each meal should contain half fruits/vegetables, one quarter protein, and one quarter carbs (no bigger than a computer mouse)  Cut down on sweet beverages. This includes  juice, soda, and sweet tea.   Drink at least 1 glass of water  with each meal and aim for at least 8 glasses per day  Exercise at least 150 minutes every week.

## 2024-05-24 NOTE — Progress Notes (Signed)
 Assessment & Plan   Assessment/Plan:   Assessment & Plan Bipolar disorder, current episode depressed, with anxiety and panic attacks and insomnia Worsening anxiety and panic attacks in the context of bipolar disorder, type 1 depression. Current medications, sertraline  and lamotrigine , are subtherapeutic. Sertraline  may destabilize bipolar disorder. Anxiety is situational, exacerbated by stressors related to her autistic son and work as a geologist, engineering. No current suicidal ideation. Counseling is ongoing twice a week. No substance use reported. - Increased lamotrigine  dosage to 200 mg, titrated gradually to monitor for rash. - Added quetiapine  50 mg at night for anxiety and sleep. - Continue counseling sessions twice a week. - Provided information on family support network for additional resources.         Medications Discontinued During This Encounter  Medication Reason   lamoTRIgine  (LAMICTAL ) 25 MG tablet     Return in about 4 weeks (around 06/21/2024).        Subjective:   Encounter date: 05/24/2024  Cheyenne Lozano is a 33 y.o. female who has Breast hypertrophy in female; Polycystic ovary syndrome; Hyperglycemia; Upper back pain, chronic; Insomnia; Generalized anxiety disorder; Bipolar disorder (HCC); Panic disorder; History of pre-eclampsia; History of gestational diabetes mellitus; History of cesarean section; Anemia; Vitamin D  deficiency; Morbid obesity (HCC); and Recurrent major depressive disorder, in partial remission on their problem list..   She  has a past medical history of Bipolar disorder (HCC) (05/09/2019), Breast hypertrophy in female (02/26/2015), Generalized anxiety disorder (05/09/2019), History of gestational diabetes mellitus (03/03/2022), History of PCOS, History of pre-eclampsia (03/03/2022), and Type II diabetes mellitus (HCC)..   She presents with chief complaint of Panic Attack (Patient states she has a stress triggered panic attack, states she had  several over the past 2 weeks. States stepping away and breathing calms her down.Pt gets panic attacks frequently. Patinet is on zoloft  and lamoTRIgine  (LAMICTAL ) 25 MG tablet) . Discussed the use of AI scribe software for clinical note transcription with the patient, who gave verbal consent to proceed.  History of Present Illness Cheyenne Lozano is a 33 year old female with bipolar type one depression who presents with worsening anxiety and panic attacks.  Anxiety and panic symptoms - Worsening anxiety and panic attacks over the past couple of months, with recent increase in severity - Ongoing panic and comorbid panic attacks - Increased anxiety attributed to situational stressors related to her son's condition and behavior - Longstanding history of anxiety dating back to sixth grade, with initial treatment in college - No suicidal ideation - Difficulty sleeping due to constant worrying - Fatigue related to persistent anxiety  Situational stressors - Significant stress related to care of six-year-old son with autism, nonverbal since diagnosis at age [age] - Recent incident of son returning from school with unexplained bruises - Increased aggression in son, contributing to maternal anxiety - Works as a geologist, engineering, leading to high baseline stress and constant caregiving responsibilities  Psychiatric and behavioral interventions - Currently taking sertraline  25 mg daily and lamotrigine  25 mg daily - Attends counseling twice a week - Not currently involved in a family support network for families with special needs children  Substance use - No use of alcohol, marijuana, or cigarettes     05/24/2024    9:30 AM 04/26/2024    8:58 AM 03/29/2024   10:34 AM 03/05/2024   10:07 AM 07/06/2023    2:17 PM  Depression screen PHQ 2/9  Decreased Interest 0 0 2 1 1   Down, Depressed, Hopeless  0 0 1 1 1   PHQ - 2 Score 0 0 3 2 2   Altered sleeping 3 2 3 1 3   Tired, decreased energy 3 0 3 1 2    Change in appetite 2 0 3 1 0  Feeling bad or failure about yourself  0 0 0 0 0  Trouble concentrating 3 2 3 1  0  Moving slowly or fidgety/restless 3 0 3 0 0  Suicidal thoughts 0 0 0 0 0  PHQ-9 Score 14 4 18  6  7    Difficult doing work/chores Somewhat difficult Not difficult at all Not difficult at all Not difficult at all Not difficult at all     Data saved with a previous flowsheet row definition      05/24/2024    9:31 AM 04/26/2024    8:59 AM 03/29/2024   10:34 AM 03/05/2024   10:08 AM  GAD 7 : Generalized Anxiety Score  Nervous, Anxious, on Edge 3 2 3 2   Control/stop worrying 3 2 3 1   Worry too much - different things 3 2 3 2   Trouble relaxing 3 2 3 3   Restless 1 0 2 0  Easily annoyed or irritable 3 1 3 3   Afraid - awful might happen 0 0 0 0  Total GAD 7 Score 16 9 17 11   Anxiety Difficulty Somewhat difficult Not difficult at all Somewhat difficult Somewhat difficult     ROS  Past Surgical History:  Procedure Laterality Date   BREAST REDUCTION SURGERY Bilateral 02/26/2015   Procedure: BILATERAL BREAST REDUCTION  ;  Surgeon: Alm Sick, MD;  Location: [facility];  Service: Plastics;  Laterality: Bilateral;   CESAREAN SECTION N/A 06/28/2017   Procedure: CESAREAN SECTION;  Surgeon: Kandis Devaughn Sayres, MD;  Location: [facility];  Service: Obstetrics;  Laterality: N/A;   CESAREAN SECTION N/A 09/08/2022   Procedure: REPEAT CESAREAN SECTION EDC: 09-14-22  ALLERG: NKDA  PREVIOUS X1;  Surgeon: Marne Kelly Nest, MD;  Location: [facility] LD ORS;  Service: Obstetrics;  Laterality: N/A;   REDUCTION MAMMAPLASTY Bilateral 02/26/2015   WISDOM TOOTH EXTRACTION  ~ 2010   bottom 2    Current Outpatient Medications on File Prior to Visit  Medication Sig Dispense Refill   Multiple Vitamin (MULTIVITAMIN PO) Take by mouth daily. Chew 2 gummies daily     sertraline  (ZOLOFT ) 25 MG tablet Take 1 tablet (25 mg total) by mouth daily. 30 tablet 5   Vitamin D , Ergocalciferol , (DRISDOL ) 1.25 MG  (50000 UNIT) CAPS capsule Take 1 capsule (50,000 Units total) by mouth every 7 (seven) days. 12 capsule 0   Baclofen  5 MG TABS Take 1 tablet (5 mg total) by mouth at bedtime. (Patient not taking: Reported on 05/24/2024) 15 tablet 0   meloxicam  (MOBIC ) 15 MG tablet Take 1 tablet (15 mg total) by mouth daily. (Patient not taking: Reported on 05/24/2024) 15 tablet 0   No current facility-administered medications on file prior to visit.    Family History  Problem Relation Age of Onset   Healthy Mother    Hypertension Father    Stroke Father    Schizophrenia Father    Diabetes Maternal Grandmother    Hypertension Maternal Grandmother    Diabetes Maternal Grandfather    Hypertension Maternal Grandfather    Heart attack Maternal Grandfather    Gout Maternal Grandfather    Hypertension Paternal Grandmother    Schizophrenia Cousin    Schizophrenia Other     Social History   Socioeconomic History  Marital status: Single    Spouse name: Not on file   Number of children: 1   Years of education: Not on file   Highest education level: Bachelor's degree (e.g., BA, AB, BS)  Occupational History   Not on file  Tobacco Use   Smoking status: Never   Smokeless tobacco: Never  Vaping Use   Vaping status: Never Used  Substance and Sexual Activity   Alcohol use: Not Currently    Comment: social   Drug use: No   Sexual activity: Yes  Other Topics Concern   Not on file  Social History Narrative   Not on file   Social Drivers of Health   Financial Resource Strain: Low Risk  (03/28/2024)   Overall Financial Resource Strain (CARDIA)    Difficulty of Paying Living Expenses: Not hard at all  Food Insecurity: No Food Insecurity (03/28/2024)   Hunger Vital Sign    Worried About Running Out of Food in the Last Year: Never true    Ran Out of Food in the Last Year: Never true  Transportation Needs: No Transportation Needs (03/28/2024)   PRAPARE - Administrator, Civil Service  (Medical): No    Lack of Transportation (Non-Medical): No  Physical Activity: Insufficiently Active (03/28/2024)   Exercise Vital Sign    Days of Exercise per Week: 3 days    Minutes of Exercise per Session: 20 min  Stress: Stress Concern Present (03/28/2024)   [employer] of Occupational Health - Occupational Stress Questionnaire    Feeling of Stress: Rather much  Social Connections: Moderately Isolated (03/28/2024)   Social Connection and Isolation Panel    Frequency of Communication with Friends and Family: More than three times a week    Frequency of Social Gatherings with Friends and Family: Once a week    Attends Religious Services: More than 4 times per year    Active Member of [employer] or Organizations: No    Attends Banker Meetings: Not on file    Marital Status: Never married  Intimate Partner Violence: Not At Risk (09/08/2022)   Humiliation, Afraid, Rape, and Kick questionnaire    Fear of Current or Ex-Partner: No    Emotionally Abused: No    Physically Abused: No    Sexually Abused: No                                                                                                  Objective:  Physical Exam: BP 107/71   Pulse 81   Temp 98.6 F (37 C)   Ht 4' 11 (1.499 m)   Wt 179 lb (81.2 kg)   LMP 04/26/2024 (Exact Date)   SpO2 100%   BMI 36.15 kg/m    Physical Exam           Physical Exam Constitutional:      General: She is not in acute distress.    Appearance: Normal appearance. She is not ill-appearing or toxic-appearing.  HENT:     Head: Normocephalic and atraumatic.     Nose: Nose normal. No congestion.  Eyes:     General: No scleral icterus.    Extraocular Movements: Extraocular movements intact.  Cardiovascular:     Rate and Rhythm: Normal rate and regular rhythm.     Pulses: Normal pulses.     Heart sounds: Normal heart sounds.  Pulmonary:     Effort: Pulmonary effort is normal. No respiratory distress.     Breath  sounds: Normal breath sounds.  Abdominal:     General: Abdomen is flat. Bowel sounds are normal.     Palpations: Abdomen is soft.  Musculoskeletal:        General: Normal range of motion.  Lymphadenopathy:     Cervical: No cervical adenopathy.  Skin:    General: Skin is warm and dry.     Findings: No rash.  Neurological:     General: No focal deficit present.     Mental Status: She is alert and oriented to person, place, and time. Mental status is at baseline.  Psychiatric:        Attention and Perception: She is attentive. She does not perceive auditory or visual hallucinations.        Mood and Affect: Mood is anxious.        Speech: She is communicative.        Behavior: Behavior normal. Behavior is cooperative.        Thought Content: Thought content normal. Thought content does not include homicidal or suicidal ideation.        Judgment: Judgment is not inappropriate.     DG Scapula Right Result Date: 05/11/2024 CLINICAL DATA:  s/p fall with pain EXAM: RIGHT SCAPULA - 2+ VIEWS COMPARISON:  None Available. FINDINGS: There is no evidence of fracture or other focal bone lesions. Soft tissues are unremarkable. IMPRESSION: Negative. Electronically Signed   By: Corean Salter M.D.   On: 05/11/2024 12:40   DG Ribs Unilateral Right Result Date: 05/11/2024 CLINICAL DATA:  s/p fall , rt rib pain EXAM: DG RIBS 2V*R* COMPARISON:  Oct 27, 2019 FINDINGS: The cardiomediastinal silhouette is normal in contour. No pleural effusion. No pneumothorax. No acute pleuroparenchymal abnormality. Visualized abdomen is unremarkable. No acute osseous abnormality noted. IMPRESSION: 1. No acute cardiopulmonary abnormality. 2. No acute displaced rib fracture visualized. Electronically Signed   By: Corean Salter M.D.   On: 05/11/2024 12:39    Recent Results (from the past 2160 hours)  Results Console HPV     Status: None   Collection Time: 03/04/24 12:00 AM  Result Value Ref Range   CHL HPV Positive    HM PAP SMEAR     Status: None   Collection Time: 03/04/24 12:00 AM  Result Value Ref Range   HM Pap smear NILM     Comment: abst by him   HPV, high-risk Positive    HPV 16 Negative    HPV 18 / 45 Negative   Hemoglobin A1c     Status: None   Collection Time: 03/05/24 10:49 AM  Result Value Ref Range   Hgb A1c MFr Bld 6.0 4.6 - 6.5 %    Comment: Glycemic Control Guidelines for People with Diabetes:Non Diabetic:  <6%Goal of Therapy: <7%Additional Action Suggested:  >8%   Comprehensive metabolic panel with GFR     Status: None   Collection Time: 03/05/24 10:49 AM  Result Value Ref Range   Sodium 137 135 - 145 mEq/L   Potassium 4.1 3.5 - 5.1 mEq/L   Chloride 108 96 - 112 mEq/L   CO2 26  19 - 32 mEq/L   Glucose, Bld 83 70 - 99 mg/dL   BUN 12 6 - 23 mg/dL   Creatinine, Ser 9.32 0.40 - 1.20 mg/dL   Total Bilirubin 0.3 0.2 - 1.2 mg/dL   Alkaline Phosphatase 64 39 - 117 U/L   AST 19 0 - 37 U/L   ALT 17 0 - 35 U/L   Total Protein 7.0 6.0 - 8.3 g/dL   Albumin  4.1 3.5 - 5.2 g/dL   GFR 883.18 >39.99 mL/min    Comment: Calculated using the CKD-EPI Creatinine Equation (2021)   Calcium 9.2 8.4 - 10.5 mg/dL  Lipid panel     Status: None   Collection Time: 03/05/24 10:49 AM  Result Value Ref Range   Cholesterol 146 0 - 200 mg/dL    Comment: ATP III Classification       Desirable:  < 200 mg/dL               Borderline High:  200 - 239 mg/dL          High:  > = 759 mg/dL   Triglycerides 39.9 0.0 - 149.0 mg/dL    Comment: Normal:  <849 mg/dLBorderline High:  150 - 199 mg/dL   HDL 41.89 >60.99 mg/dL   VLDL 87.9 0.0 - 59.9 mg/dL   LDL Cholesterol 76 0 - 99 mg/dL   Total CHOL/HDL Ratio 3     Comment:                Men          Women1/2 Average Risk     3.4          3.3Average Risk          5.0          4.42X Average Risk          9.6          7.13X Average Risk          15.0          11.0                       NonHDL 87.67     Comment: NOTE:  Non-HDL goal should be 30 mg/dL higher than  patient's LDL goal (i.e. LDL goal of < 70 mg/dL, would have non-HDL goal of < 100 mg/dL)  VITAMIN D  25 Hydroxy (Vit-D Deficiency, Fractures)     Status: Abnormal   Collection Time: 03/05/24 10:49 AM  Result Value Ref Range   VITD 15.32 (L) 30.00 - 100.00 ng/mL  [facility] Molecular Screen - Blood     Status: None   Collection Time: 04/26/24  9:18 AM  Result Value Ref Range   Genetic Analysis Overall Interpretation Negative    Genetic Disease Assessed      This is a screening test and does not detect all pathogenic or likely pathogenic variant(s) in the tested genes; diagnostic testing is recommended for individuals with a personal or family history of heart disease or hereditary cancer. [facility] Tier One  Population Screen is a screening test that analyzes 11 genes related to hereditary breast and ovarian cancer (HBOC) syndrome, Lynch syndrome, and familial hypercholesterolemia. This test only reports clinically significant pathogenic and likely  pathogenic variants but does not report variants of uncertain significance (VUS). In addition, analysis of the PMS2 gene excludes exons 11-15, which overlap with a known pseudogene (PMS2CL).    Genetic Analysis Report  No pathogenic or likely pathogenic variants were detected in the genes analyzed by this test.Genetic test results should be interpreted in the context of an individual's personal medical and family history. Alteration to medical management is NOT  recommended based solely on this result. Clinical correlation is advised.Additional Considerations- This is a screening test; individuals may still carry pathogenic or likely pathogenic variant(s) in the tested genes that are not detected by this test.-  For individuals at risk for these or other related conditions based on factors including personal or family history, diagnostic testing is recommended.- The absence of pathogenic or likely pathogenic variant(s) in the analyzed genes, while  reassuring,  does not eliminate the possibility of a hereditary condition; there are other variants and genes associated with heart disease and hereditary cancer that are not included in this test.    Genes Tested See Notes     Comment: APOB, BRCA1, BRCA2, EPCAM, LDLR, LDLRAP1, PCSK9, PMS2, MLH1, MSH2, MSH6   Disclaimer See Notes     Comment: This test was developed and validated by [facility] This test has not been cleared or approved by the [place]  Food and Drug Administration (FDA). The [facility] laboratory is accredited by the College of American Pathologists (CAP) and certified under  the Clinical Laboratory Improvement Amendments (CLIA #: [id number]) to perform high-complexity clinical tests. This test is used for clinical purposes. It should not be regarded as investigational use only or for research use only.    Sequencing Location See Notes     Comment: Sequencing done at [facility] [address], [address], [place] [address] (CLIA# [id number])   Interpretation Methods and Limitations See Notes     Comment: Extracted DNA is enriched for targeted regions and then sequenced using the [facility] Exome+ (R) assay on an Illumina DNA sequencing system. Data is then aligned to a modified version of GRCh38 and all genes are analyzed using the MANE transcript and MANE  Plus Clinical transcript, when available. Small variant calling is completed using a customized version of [employer]'s DNAseq software, augmented by a proprietary small variant caller for difficult variants. Copy number variants (CNVs) are then called  using a proprietary bioinformatics pipeline based on depth analysis with a comparison to similarly sequenced samples. Analysis of the PMS2 gene is limited to exons 1-10. Both the MSH2 Boland inversion (exons 1-7) and the BRCA2 Alu insertion are detected  by identifying discordant read-pairs spanning the breakpoints. The interpretation and reporting of variants in APOB,  PCSK9, and LDLR is specific to familial hypercholesterolemia; variants associated with hypobetalipoproteinemia are not i ncluded.  Interpretation is based upon guidelines published by the [employer] of [employer] and [employer]), the Association for Molecular Pathology (AMP) or their modification by [employer] when available and/or review  of previous clinical assertions available in the [employer]. Interpretation is limited to the transcripts indicated on the report and +/- 10 bp into intronic regions, except as noted below. [facility] variant classifications include pathogenic, likely  pathogenic, variant of uncertain significance (VUS), likely benign, and benign. Only variants classified as pathogenic and likely pathogenic are included in the report. All reported variants are confirmed through secondary manual inspection of DNA  sequence data or orthogonal testing. Risk estimations and management guidelines included in this report are based on analysis of primary literature and recommendations of applicable professional societies, and should be regarded  as approximations.Based  on validation studies, this assay delivers > 99% sensitivity and  specificity for single nucleotide variants and insertions and deletions (indels) up to 20 bp. Larger indels and complex variants are also reported but sensitivity may be reduced. Based on  validation studies, this assay delivers > 99% sensitivity to multi-exon CNVs and > 90% sensitivity to single-exon CNVs. This test may not detect variants in challenging regions (such as short tandem repeats, homopolymer runs, and segment duplications),  sub-exonic CNVs, chromosomal aneuploidy, or variants in the presence of mosaicism. Phasing will be attempted and reported, when possible. Structural rearrangements such as inversions, translocations, complex rearrangements, and gene conversions are not  tested in this assay unless  explicitly indicated. Additionally, deep intronic, promoter, and enhancer regions may not be covered. It is important to note that this is a screening test and cannot detect all di sease-causing variants. A negative result does  not guarantee the absence of a rare, undetectable variant in the genes analyzed; consider using a diagnostic test if there is significant personal and/or family history of one of the conditions analyzed by this test. Any potential incidental findings  outside of these genes and conditions will not be identified, nor reported. The results of a genetic test may be influenced by various factors, including bone marrow transplantation, blood transfusions, or in rare cases, hematolymphoid neoplasms.Gene  Specific Notes:APOB: analysis is limited to c.10580G>A and c.10579C>T; BRCA1: sequencing analysis extends to CDS +/-20 bp; BRCA2: analysis includes detection of [id number] and sequencing analysis extends to CDS +/-20 bp. EPCAM: analysis is limited  to CNV of exons 8-9; LDLR: analysis includes CNV of the promoter; MLH1: analysis includes CNV of the promoter; MSH2: analysis includes detection of the Boland inversion (inversion of exons 1 -7) and detection of c.942+3A>T, PMS2: analysis is limited to  exons 1-10.Donnice JINNY Kemp, PhD, [email or web address]         Beverley KATHEE Hummer, MD  I,Emily Lagle,acting as a scribe for Beverley KATHEE Hummer, MD.,have documented all relevant documentation on the behalf of Beverley KATHEE Hummer, MD.  LILLETTE Beverley KATHEE Hummer, MD, have reviewed all documentation for this visit. The documentation on 05/24/2024 for the exam, diagnosis, procedures, and orders are all accurate and complete.

## 2024-05-27 DIAGNOSIS — F4389 Other reactions to severe stress: Secondary | ICD-10-CM | POA: Insufficient documentation

## 2024-07-09 ENCOUNTER — Other Ambulatory Visit (HOSPITAL_COMMUNITY)

## 2024-08-02 ENCOUNTER — Ambulatory Visit: Admitting: Nurse Practitioner
